# Patient Record
Sex: Female | Born: 1975 | Race: White | Hispanic: No | Marital: Single | Smoking: Never smoker
Health system: Southern US, Community
[De-identification: ages and names within clinical notes are randomized; demographics above are authoritative.]

## PROBLEM LIST (undated history)

## (undated) DIAGNOSIS — F909 Attention-deficit hyperactivity disorder, unspecified type: Secondary | ICD-10-CM

## (undated) DIAGNOSIS — R519 Headache, unspecified: Secondary | ICD-10-CM

## (undated) DIAGNOSIS — T7840XA Allergy, unspecified, initial encounter: Secondary | ICD-10-CM

## (undated) DIAGNOSIS — R011 Cardiac murmur, unspecified: Secondary | ICD-10-CM

## (undated) DIAGNOSIS — A749 Chlamydial infection, unspecified: Secondary | ICD-10-CM

## (undated) DIAGNOSIS — J45909 Unspecified asthma, uncomplicated: Secondary | ICD-10-CM

## (undated) DIAGNOSIS — K219 Gastro-esophageal reflux disease without esophagitis: Secondary | ICD-10-CM

## (undated) DIAGNOSIS — F419 Anxiety disorder, unspecified: Secondary | ICD-10-CM

## (undated) DIAGNOSIS — F32A Depression, unspecified: Secondary | ICD-10-CM

## (undated) DIAGNOSIS — R87619 Unspecified abnormal cytological findings in specimens from cervix uteri: Secondary | ICD-10-CM

## (undated) DIAGNOSIS — F329 Major depressive disorder, single episode, unspecified: Secondary | ICD-10-CM

## (undated) HISTORY — DX: Anxiety disorder, unspecified: F41.9

## (undated) HISTORY — PX: WISDOM TOOTH EXTRACTION: SHX21

## (undated) HISTORY — DX: Cardiac murmur, unspecified: R01.1

## (undated) HISTORY — PX: WRIST SURGERY: SHX841

## (undated) HISTORY — DX: Unspecified abnormal cytological findings in specimens from cervix uteri: R87.619

## (undated) HISTORY — DX: Depression, unspecified: F32.A

## (undated) HISTORY — DX: Allergy, unspecified, initial encounter: T78.40XA

## (undated) HISTORY — DX: Major depressive disorder, single episode, unspecified: F32.9

---

## 2006-08-10 ENCOUNTER — Ambulatory Visit: Payer: Self-pay | Admitting: Obstetrics & Gynecology

## 2006-08-31 ENCOUNTER — Ambulatory Visit: Payer: Self-pay | Admitting: Obstetrics & Gynecology

## 2007-02-27 ENCOUNTER — Ambulatory Visit: Payer: Self-pay | Admitting: Family Medicine

## 2007-07-24 ENCOUNTER — Ambulatory Visit: Payer: Self-pay | Admitting: Obstetrics & Gynecology

## 2007-09-17 ENCOUNTER — Ambulatory Visit: Payer: Self-pay | Admitting: Obstetrics & Gynecology

## 2007-09-17 ENCOUNTER — Encounter: Payer: Self-pay | Admitting: Obstetrics & Gynecology

## 2007-10-24 ENCOUNTER — Ambulatory Visit: Payer: Self-pay | Admitting: Obstetrics & Gynecology

## 2007-11-08 ENCOUNTER — Ambulatory Visit: Payer: Self-pay | Admitting: Obstetrics & Gynecology

## 2007-12-06 ENCOUNTER — Ambulatory Visit: Payer: Self-pay | Admitting: Nurse Practitioner

## 2007-12-10 ENCOUNTER — Ambulatory Visit: Payer: Self-pay | Admitting: Gynecology

## 2008-08-07 ENCOUNTER — Encounter: Payer: Self-pay | Admitting: Obstetrics & Gynecology

## 2008-08-07 ENCOUNTER — Ambulatory Visit: Payer: Self-pay | Admitting: Obstetrics & Gynecology

## 2009-05-06 ENCOUNTER — Ambulatory Visit: Payer: Self-pay | Admitting: Obstetrics & Gynecology

## 2009-05-07 ENCOUNTER — Encounter: Payer: Self-pay | Admitting: Obstetrics & Gynecology

## 2009-05-07 LAB — CONVERTED CEMR LAB
Clue Cells Wet Prep HPF POC: NONE SEEN
Trich, Wet Prep: NONE SEEN

## 2010-11-02 NOTE — Assessment & Plan Note (Signed)
Wendy, [name]                ACCOUNT NO.:  [id number]   MEDICAL RECORD NO.:  [id number]          PATIENT TYPE:  POB   LOCATION:  [facility]         FACILITY:  [facility]   PHYSICIAN:  Elsie Lincoln, MD      DATE OF BIRTH:  Oct 26, 1975   DATE OF SERVICE:  11/08/2007                                  CLINIC NOTE   The patient is a 35 year old female who presents for followup of  colposcopy.  The patient has a history of a low grade Pap smear and  underwent colposcopy with Dr. Marice Potter, which was satisfactory and one  biopsy was taken.  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  I discussed the pathogenesis of cervical  dysplasia with the patient and given that it is merely an LSIL Pap smear  with adequate colposcopy, the patient can be followed Pap smears q.6  months for a year.  If hose are within normal limits, she can return to  yearly screening.  However, if her Pap smear persists for a year for  LSIL, or progresses to a high-grade lesion, we will repeat colposcopy at  that point.  The patient verbalizes understanding.  We also talked about  Gardasil vaccination for her daughters when they reach age 57.  The  patient to come back in 6 months for repeat Pap smear only.           ______________________________  Elsie Lincoln, MD     KL/MEDQ  D:  11/08/2007  T:  11/08/2007  Job:  [id number]

## 2010-11-02 NOTE — Assessment & Plan Note (Signed)
NAMELERLINE, VALDIVIA                ACCOUNT NO.:  [id number]   MEDICAL RECORD NO.:  [id number]          PATIENT TYPE:  POB   LOCATION:  [facility]         FACILITY:  [facility]   PHYSICIAN:  Allie Bossier, MD        DATE OF BIRTH:  21-Oct-1975   DATE OF SERVICE:                                  CLINIC NOTE   Wendy [name] is a 35 year old divorced, white gravida 2, para 2 with 30- to 67-  year-old daughter.  She comes in here for annual exam and follow up for  history of abnormal Pap smears.  She has no particular GYN complaints.  Of note, she has lost over 40 pounds in the last year by training for a  half marathon and decreasing her carbohydrate intake.   PAST MEDICAL HISTORY:  She was recently diagnosed with ADD.  She has a  history of depression, acne, low-grade SIL Pap, and history of urinary  incontinence.   SURGICAL HISTORY:  She has a C-section, ganglion cyst removal, and  wisdom teeth extraction.   DRUG ALLERGIES:  CODEINE.  No LATEX allergies.   SOCIAL HISTORY:  She rarely drinks alcohol, denies tobacco, or drug use.   FAMILY HISTORY:  Negative for breast, GYN, and colon malignancies.  There is coronary heart disease in her family.  The remainder of review  of systems is negative.  She has been abstinent for the last year.   MEDICATIONS:  She has a Mirena IUD in placed.  She takes doxycycline  daily for acne, and Ritalin daily for her ADD.   PHYSICAL EXAMINATION:  VITAL SIGNS:  Weight 148, height 5 feet 3-3/4  inches, blood pressure 115/71, and pulse 63.  HEENT:  Normal.  BREASTS:  Normal bilaterally.  HEART:  Regular rate and rhythm.  ABDOMEN:  No hepatosplenomegaly.  C-section scar is visible.  EXTERNAL GENITALIA:  No lesions today.  No evidence of yeast (see  previous vulvar biopsies).  Cervix is parous, no lesions.  Uterus is  normal size, and shape, mobile.  Adnexa nontender.  No masses.   ASSESSMENT AND PLAN:  Annual exam.  I have checked a Pap smear.  I  recommended self-breast and self vulvar exams.  She will return in 6  months for a Pap smear since she did have a history of low-grade  dysplasia.      Allie Bossier, MD    MCD/MEDQ  D:  08/07/2008  T:  08/07/2008  Job:  [id number]

## 2010-11-02 NOTE — Assessment & Plan Note (Signed)
Wendy [name], BACHAND                ACCOUNT NO.:  [id number]   MEDICAL RECORD NO.:  [id number]          PATIENT TYPE:  POB   LOCATION:  [facility]         FACILITY:  [facility]   PHYSICIAN:  Allie Bossier, MD        DATE OF BIRTH:  01/13/1976   DATE OF SERVICE:                                  CLINIC NOTE   This is her annual exam.   Ms. Fogal only complaint today is a 5-year history of periodic  excoriation of her vulva.  She has this happening today.  She is still  having some irregular bleeding from her IUD but understands that they  most likely will eventually resolve.   PAST MEDICAL HISTORY:  1. She is overweight.  2. History of depression.  3. Acne.   PAST SURGICAL HISTORY:  1. A C-section.  2. A ganglion cyst.  3. Wisdom teeth extraction.   ALLERGIES:  Her only allergy to medicines is CODEINE.  No latex allergy.   SOCIAL HISTORY:  She drinks socially but denies tobacco or illegal drug  use.   FAMILY HISTORY:  Negative for breast, GYN and colon malignancies but is  positive for heart disease.   REVIEW OF SYSTEMS:  She denies a history of abnormal Pap smears.   PHYSICAL EXAMINATION:  VITAL SIGNS:  Weight 176 pounds, height 5 feet, 3  inches.  Blood pressure 109/71, pulse 80.  HEENT:  Normal.  HEART:  Regular rate and rhythm.  LUNGS:  Clear to auscultation bilaterally.  ABDOMEN:  Moderately obese, benign.  PELVIC:  External genitalia - an excoriation and sloughing of the skin  from mid level of her groin/labia majora down to her perianal area.  Vagina normal with normal discharge.  Cervix - IUD string seen; parous.  Uterus normal size and shape, anteverted, mobile, nontender.  Adnexa  nontender, nonenlarged.   ASSESSMENT/PLAN:  1. Annual exam.  Recommend self-breast and self vulvar exams.  2. Vulvar excoriations plus skin changes.  Will check HSV-2, IgG and      treat as necessary.  3. I have given her reassurance regarding her IUD-associated  bleeding.      Allie Bossier, MD     MCD/MEDQ  D:  09/17/2007  T:  09/17/2007  Job:  [id number]

## 2010-11-02 NOTE — Assessment & Plan Note (Signed)
Wendy [name], Wendy [name]                ACCOUNT NO.:  [id number]   MEDICAL RECORD NO.:  [id number]          PATIENT TYPE:  POB   LOCATION:  [facility]         FACILITY:  [facility]   PHYSICIAN:  Tinnie Gens, MD        DATE OF BIRTH:  [birth year]   DATE OF SERVICE:  02/27/2007                                  CLINIC NOTE   CHIEF COMPLAINT:  IUD removal.   HISTORY OF PRESENT ILLNESS:  The patient IS 35 year old gravida 2 para 2  who has had an IUD in since 2004.  She is no longer married and not  sexually active and desires it to be removed.  She is having heavy  discharge and heavy vaginal bleeding.   Her next concern is about incontinence.  She has had it since the  delivery of her babies.  She has talked to Dr. . Mia Creek about this  previously and is wondering if there is some kind of medicine she could  take for it or maybe get her bladder tacked.   PHYSICAL EXAMINATION:  VITAL SIGNS:  Her vitals are as noted the chart.  GENERAL:  She a well-developed, well-nourished female, in no acute  distress.  ABDOMEN:  Soft, nontender, nondistended.  GU:  Normal external female genitalia.  BUS is normal.  Vagina is pink  and rugated.  Cervix is parous, without lesions.  IUD string is easily  identified, grasped with ring forceps, and IUD removed without  difficulty.   IMPRESSION:  1. Intrauterine device removal.  2. Incontinence, questionable genuine stress incontinence.   PLAN:  The patient is unsure if she wants more children.  I do not think  bladder tacking at this point is reasonable. However, will allow her to  see Dr. Mia Creek to discuss this potential.           ______________________________  Tinnie Gens, MD     TP/MEDQ  D:  02/27/2007  T:  02/28/2007  Job:  [id number]

## 2010-11-02 NOTE — Assessment & Plan Note (Signed)
NAMESEELEY, SOUTHGATE                ACCOUNT NO.:  [id number]   MEDICAL RECORD NO.:  [id number]          PATIENT TYPE:  POB   LOCATION:  [facility]         FACILITY:  [facility]   PHYSICIAN:  Ginger Carne, MD DATE OF BIRTH:  November 25, 1975   DATE OF SERVICE:  12/10/2007                                  CLINIC NOTE   Ms. Baine is a 35 year old female who is here for vulvar biopsies.  She  has bilateral erythema of the vulva, groin, labia majora, and upper  parts of the fourchette.  It is peeling, tender, irritating, and also  pruritic.  She has had this off and on for the past 5 years.   PHYSICAL EXAMINATION:  Pelvic exam demonstrates no evidence of vaginal  lesions or discharge.  The vulva is tender, erythematous, and appears to  be thin with extension to the groin, fourchette, frenulum, and perianal  regions.  Punch biopsies were taken on the right and left sides (right  on the vulva, left on the labia majora).   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  The other possibility could be eczema as well.           ______________________________  Ginger Carne, MD     SHB/MEDQ  D:  12/10/2007  T:  12/11/2007  Job:  [id number]

## 2010-11-05 NOTE — Assessment & Plan Note (Signed)
Wendy [name], Wendy [name]                ACCOUNT NO.:  [id number]   MEDICAL RECORD NO.:  [id number]          PATIENT TYPE:  POB   LOCATION:  [facility]         FACILITY:  [facility]   PHYSICIAN:  Elsie Lincoln, MD      DATE OF BIRTH:  11-Jul-1975   DATE OF SERVICE:                                  CLINIC NOTE   The patient is a 35 year old, G2, P2 female, LMP unknown, but has been  having some bleeding.  She had an IUD placed in 2004 by Dr. Mia Creek,  who historically cuts his strings extremely short.  She is amenorrheic  for several years up until just recently and now has been having some  weird clotting.  She had a Pap smear done by a nurse practitioner, who  did not comment on the IUD strings, so she came to [place] to see what was  going on.  The patient just wants to make sure that the IUD is in the  correct place and working.   PAST MEDICAL HISTORY:  Heart murmur.   PAST SURGICAL HISTORY:  C section times 1 and removal of a ganglion cyst  in 1999.  Her GYN history, she has never had an abnormal Pap smear and  never had a mammogram.   FAMILY HISTORY:  Both grandparents have heart disease.  Both  grandparents have had heart attacks and her grandmother has high blood  pressure.   SOCIAL HISTORY:  She does not smoke.  She has occasional alcoholic  beverages and drinks several caffeinated beverages a week.  She is not a  victim of sexual abuse.  She lives with her 2 daughters.   PHYSICAL EXAMINATION:  VITAL SIGNS:  Pulse 79, blood pressure 114/77.  GENERAL:  Well nourished, well developed, in no apparent distress.  GENITALIA:  __________ .  Vagina pink with normal rugae.  IUD strings  appear at least 3 cm.  Bimanual exam cannot feel any part of the IUD.  Uterus is nontender.  Adnexa:  No masses, nontender.   PLAN:  This is a 35 year old female with abnormal bleeding secondary to  IUD.  1. UCD today.  2. Ultrasound to see IUD placement given that Dr. Mia Creek normally      cuts  his strings very short and they are at least 3 cm now.  It is      worrisome that the IUD is dislodged and that is why she is having      the increased bleeding.  3. Return to clinic in 2 weeks for results.           ______________________________  Elsie Lincoln, MD     KL/MEDQ  D:  08/10/2006  T:  08/10/2006  Job:  [id number]

## 2010-11-05 NOTE — Assessment & Plan Note (Signed)
[name], [name]                ACCOUNT NO.:  [id number]   MEDICAL RECORD NO.:  [id number]          PATIENT TYPE:  POB   LOCATION:  [facility]         FACILITY:  [facility]   PHYSICIAN:  Carolanne Grumbling, M.D.   DATE OF BIRTH:  [birth year]   DATE OF SERVICE:  08/31/2006                                  CLINIC NOTE   A 35 year old gravida 2, para 2 here for evaluation of intrauterine  device.  Please see dictation from August 10, 2006 for full details.  Briefly, the patient had a intrauterine device placed in 2004 and  started having vaginal bleeding and she was concerned it was displaced.  Pelvic ultrasound done on August 16, 2006 showed it is still in  position.  The patient has had minimal spotting since she was last seen.  Other issue today is depression.  The patient has been on Effexor 75 mg  daily for over one year.  She reports good response to it initially.  Endorses increase somnolence, anhedonia, and a 15 pound weight gain.  Describes herself as feeling disjoined.  All this has been aggravated  by her going through a divorce.  She would like to increase her  medications.   PHYSICAL EXAMINATION:  VITAL SIGNS:  Per nursing.  GENERAL:  A well-developed, well-nourished female who is very cheerful  during the exam.  Her mother accompanies her today.   ASSESSMENT/PLAN:  1. Depression.  We will increase her Effexor to 150 mg daily.  Stress      the importance of not stopping this medicine suddenly.  She is also      to start seeing a counselor on September 05, 2006.  She denies any      suicidal ideation today.  2. Abnormal uterine bleeding, resolved.  Re-assured the patient that      the intrauterine device was in the appropriate place.  She is to      follow up.           ______________________________  Carolanne Grumbling, M.D.     TW/MEDQ  D:  08/31/2006  T:  09/01/2006  Job:  [id number]

## 2011-04-02 ENCOUNTER — Emergency Department: Payer: Self-pay | Admitting: *Deleted

## 2011-10-19 DIAGNOSIS — R87619 Unspecified abnormal cytological findings in specimens from cervix uteri: Secondary | ICD-10-CM

## 2011-10-19 HISTORY — DX: Unspecified abnormal cytological findings in specimens from cervix uteri: R87.619

## 2012-02-13 ENCOUNTER — Encounter: Payer: Self-pay | Admitting: Obstetrics and Gynecology

## 2012-02-13 ENCOUNTER — Ambulatory Visit (INDEPENDENT_AMBULATORY_CARE_PROVIDER_SITE_OTHER): Payer: PRIVATE HEALTH INSURANCE | Admitting: Obstetrics and Gynecology

## 2012-02-13 VITALS — BP 128/83 | HR 95 | Ht 63.0 in | Wt 179.0 lb

## 2012-02-13 DIAGNOSIS — F329 Major depressive disorder, single episode, unspecified: Secondary | ICD-10-CM

## 2012-02-13 DIAGNOSIS — N760 Acute vaginitis: Secondary | ICD-10-CM

## 2012-02-13 DIAGNOSIS — F32A Depression, unspecified: Secondary | ICD-10-CM | POA: Insufficient documentation

## 2012-02-13 LAB — POCT URINALYSIS DIPSTICK
Blood, UA: NEGATIVE
Ketones, UA: NEGATIVE
Protein, UA: NEGATIVE
Spec Grav, UA: 1.025
Urobilinogen, UA: 0.2

## 2012-02-13 NOTE — Progress Notes (Signed)
Patient ID: Wendy [name], female   DOB: 1976-04-15, 36 y.o.   MRN: [id number] 36 yo G2P2 with BMI 31 and using Mirena IUD for birth control presents today for several issues. Patient reports that for the past several years she has noticed a progressive worsening of urinary incontinence. Patient states that she is not aware that she is urinating on herself and often smells of urine by the end of the day. Patient denies incontinence with coughing/sneezing/laughing. Patient is not aware that she is urinating on herself but does report that it occurs several times a day. Patient is certain that it is not a vaginal discharge but rather urine by its smell.  Patient reports onset of depression and anxiety since having the IUD inserted in 2004. Patient states that she is being managed for depression with Wellbutrin. After doing some research she has found that the Mirena IUD can cause depression in some women. She desires to have the IUD removed and is planning on using condoms for birth control. Patient desires to be evaluated for a vaginal odor. She denies any abnormal discharge but reports the onset of a vaginal odor a week ago and is wondering if i tis BV. She is sexually active with a new Partner since April 2013 and declined STD testing.   Abd: S/NT/ND Pelvic exam: normal external genitalia. Normal vaginal mucosa and cervix. No abnormal discharge. IUD strings visualized at os. Small uterus, no palpable adnexal mass or tenderness. Good pelvic tone. No degree of bladder prolapse appreaciated.  A/P 36 yo G2P2 here with multiple complaints. 1) Patient will be referred to Dr. Marice Potter for the evaluation of ? Urinary incontinence 2) IUD was removed per patient request without any difficulty after grasping IUD stings with a ring forceps. Patient is planning on using condoms for birth control 3) Wet prep and urine culture were collected. Patient will be contacted with any abnormal results. 4) patient with h/o abnormal  pap smear and had pap smear performed in May in a different office. Will obtain these records

## 2012-02-13 NOTE — Addendum Note (Signed)
Addended by: Vinnie Langton C on: 02/13/2012 02:50 PM   Modules accepted: Orders

## 2012-02-13 NOTE — Progress Notes (Deleted)
  Subjective:     Wendy [name] is a 36 y.o. female G2P2 with BMI 31 is here for a comprehensive physical exam. The patient reports {problems:16946}.  History   Social History  . Marital Status: Divorced    Spouse Name: N/A    Number of Children: N/A  . Years of Education: N/A   Occupational History  . Not on file.   Social History Main Topics  . Smoking status: Never Smoker   . Smokeless tobacco: Not on file  . Alcohol Use: Yes     occasion  . Drug Use: No  . Sexually Active: Yes -- Female partner(s)    Birth Control/ Protection: IUD   Other Topics Concern  . Not on file   Social History Narrative  . No narrative on file   Health Maintenance  Topic Date Due  . Pap Smear  02/15/1994  . Tetanus/tdap  02/16/1995  . Influenza Vaccine  03/20/2012       Review of Systems A comprehensive review of systems was negative.   Objective:     GENERAL: Well-developed, well-nourished female in no acute distress.  HEENT: Normocephalic, atraumatic. Sclerae anicteric.  NECK: Supple. Normal thyroid.  LUNGS: Clear to auscultation bilaterally.  HEART: Regular rate and rhythm. BREASTS: Symmetric in size. No palpable masses or lymphadenopathy, skin changes, or nipple drainage. ABDOMEN: Soft, nontender, nondistended. No organomegaly. PELVIC: Normal external female genitalia. Vagina is pink and rugated.  Normal discharge. Normal appearing cervix. Uterus is normal in size. No adnexal mass or tenderness. EXTREMITIES: No cyanosis, clubbing, or edema, 2+ distal pulses.    Assessment:    Healthy female exam.      Plan:    pap smear collected Wet prep collected Patient encouraged to perform self breast and vulva exams See After Visit Summary for Counseling Recommendations

## 2012-02-13 NOTE — Addendum Note (Signed)
Addended by: Vinnie Langton C on: 02/13/2012 02:54 PM   Modules accepted: Orders

## 2012-02-13 NOTE — Patient Instructions (Signed)
Preventive Care for Adults, Female A healthy lifestyle and preventive care can promote health and wellness. Preventive health guidelines for women include the following key practices.  A routine yearly physical is a good way to check with your caregiver about your health and preventive screening. It is a chance to share any concerns and updates on your health, and to receive a thorough exam.   Visit your dentist for a routine exam and preventive care every 6 months. Brush your teeth twice a day and floss once a day. Good oral hygiene prevents tooth decay and gum disease.   The frequency of eye exams is based on your age, health, family medical history, use of contact lenses, and other factors. Follow your caregiver's recommendations for frequency of eye exams.   Eat a healthy diet. Foods like vegetables, fruits, whole grains, low-fat dairy products, and lean protein foods contain the nutrients you need without too many calories. Decrease your intake of foods high in solid fats, added sugars, and salt. Eat the right amount of calories for you.Get information about a proper diet from your caregiver, if necessary.   Regular physical exercise is one of the most important things you can do for your health. Most adults should get at least 150 minutes of moderate-intensity exercise (any activity that increases your heart rate and causes you to sweat) each week. In addition, most adults need muscle-strengthening exercises on 2 or more days a week.   Maintain a healthy weight. The body mass index (BMI) is a screening tool to identify possible weight problems. It provides an estimate of body fat based on height and weight. Your caregiver can help determine your BMI, and can help you achieve or maintain a healthy weight.For adults 20 years and older:   A BMI below 18.5 is considered underweight.   A BMI of 18.5 to 24.9 is normal.   A BMI of 25 to 29.9 is considered overweight.   A BMI of 30 and above is  considered obese.   Maintain normal blood lipids and cholesterol levels by exercising and minimizing your intake of saturated fat. Eat a balanced diet with plenty of fruit and vegetables. Blood tests for lipids and cholesterol should begin at age 20 and be repeated every 5 years. If your lipid or cholesterol levels are high, you are over 50, or you are at high risk for heart disease, you may need your cholesterol levels checked more frequently.Ongoing high lipid and cholesterol levels should be treated with medicines if diet and exercise are not effective.   If you smoke, find out from your caregiver how to quit. If you do not use tobacco, do not start.   If you are pregnant, do not drink alcohol. If you are breastfeeding, be very cautious about drinking alcohol. If you are not pregnant and choose to drink alcohol, do not exceed 1 drink per day. One drink is considered to be 12 ounces (355 mL) of beer, 5 ounces (148 mL) of wine, or 1.5 ounces (44 mL) of liquor.   Avoid use of street drugs. Do not share needles with anyone. Ask for help if you need support or instructions about stopping the use of drugs.   High blood pressure causes heart disease and increases the risk of stroke. Your blood pressure should be checked at least every 1 to 2 years. Ongoing high blood pressure should be treated with medicines if weight loss and exercise are not effective.   If you are 55 to 36   years old, ask your caregiver if you should take aspirin to prevent strokes.   Diabetes screening involves taking a blood sample to check your fasting blood sugar level. This should be done once every 3 years, after age 45, if you are within normal weight and without risk factors for diabetes. Testing should be considered at a younger age or be carried out more frequently if you are overweight and have at least 1 risk factor for diabetes.   Breast cancer screening is essential preventive care for women. You should practice "breast  self-awareness." This means understanding the normal appearance and feel of your breasts and may include breast self-examination. Any changes detected, no matter how small, should be reported to a caregiver. Women in their 20s and 30s should have a clinical breast exam (CBE) by a caregiver as part of a regular health exam every 1 to 3 years. After age 40, women should have a CBE every year. Starting at age 40, women should consider having a mammography (breast X-ray test) every year. Women who have a family history of breast cancer should talk to their caregiver about genetic screening. Women at a high risk of breast cancer should talk to their caregivers about having magnetic resonance imaging (MRI) and a mammography every year.   The Pap test is a screening test for cervical cancer. A Pap test can show cell changes on the cervix that might become cervical cancer if left untreated. A Pap test is a procedure in which cells are obtained and examined from the lower end of the uterus (cervix).   Women should have a Pap test starting at age 21.   Between ages 21 and 29, Pap tests should be repeated every 2 years.   Beginning at age 30, you should have a Pap test every 3 years as long as the past 3 Pap tests have been normal.   Some women have medical problems that increase the chance of getting cervical cancer. Talk to your caregiver about these problems. It is especially important to talk to your caregiver if a new problem develops soon after your last Pap test. In these cases, your caregiver may recommend more frequent screening and Pap tests.   The above recommendations are the same for women who have or have not gotten the vaccine for human papillomavirus (HPV).   If you had a hysterectomy for a problem that was not cancer or a condition that could lead to cancer, then you no longer need Pap tests. Even if you no longer need a Pap test, a regular exam is a good idea to make sure no other problems are  starting.   If you are between ages 65 and 70, and you have had normal Pap tests going back 10 years, you no longer need Pap tests. Even if you no longer need a Pap test, a regular exam is a good idea to make sure no other problems are starting.   If you have had past treatment for cervical cancer or a condition that could lead to cancer, you need Pap tests and screening for cancer for at least 20 years after your treatment.   If Pap tests have been discontinued, risk factors (such as a new sexual partner) need to be reassessed to determine if screening should be resumed.   The HPV test is an additional test that may be used for cervical cancer screening. The HPV test looks for the virus that can cause the cell changes on the cervix.   The cells collected during the Pap test can be tested for HPV. The HPV test could be used to screen women aged 30 years and older, and should be used in women of any age who have unclear Pap test results. After the age of 30, women should have HPV testing at the same frequency as a Pap test.   Colorectal cancer can be detected and often prevented. Most routine colorectal cancer screening begins at the age of 50 and continues through age 75. However, your caregiver may recommend screening at an earlier age if you have risk factors for colon cancer. On a yearly basis, your caregiver may provide home test kits to check for hidden blood in the stool. Use of a small camera at the end of a tube, to directly examine the colon (sigmoidoscopy or colonoscopy), can detect the earliest forms of colorectal cancer. Talk to your caregiver about this at age 50, when routine screening begins. Direct examination of the colon should be repeated every 5 to 10 years through age 75, unless early forms of pre-cancerous polyps or small growths are found.   Hepatitis C blood testing is recommended for all people born from 1945 through 1965 and any individual with known risks for hepatitis C.    Practice safe sex. Use condoms and avoid high-risk sexual practices to reduce the spread of sexually transmitted infections (STIs). STIs include gonorrhea, chlamydia, syphilis, trichomonas, herpes, HPV, and human immunodeficiency virus (HIV). Herpes, HIV, and HPV are viral illnesses that have no cure. They can result in disability, cancer, and death. Sexually active women aged 25 and younger should be checked for chlamydia. Older women with new or multiple partners should also be tested for chlamydia. Testing for other STIs is recommended if you are sexually active and at increased risk.   Osteoporosis is a disease in which the bones lose minerals and strength with aging. This can result in serious bone fractures. The risk of osteoporosis can be identified using a bone density scan. Women ages 65 and over and women at risk for fractures or osteoporosis should discuss screening with their caregivers. Ask your caregiver whether you should take a calcium supplement or vitamin D to reduce the rate of osteoporosis.   Menopause can be associated with physical symptoms and risks. Hormone replacement therapy is available to decrease symptoms and risks. You should talk to your caregiver about whether hormone replacement therapy is right for you.   Use sunscreen with sun protection factor (SPF) of 30 or more. Apply sunscreen liberally and repeatedly throughout the day. You should seek shade when your shadow is shorter than you. Protect yourself by wearing long sleeves, pants, a wide-brimmed hat, and sunglasses year round, whenever you are outdoors.   Once a month, do a whole body skin exam, using a mirror to look at the skin on your back. Notify your caregiver of new moles, moles that have irregular borders, moles that are larger than a pencil eraser, or moles that have changed in shape or color.   Stay current with required immunizations.   Influenza. You need a dose every fall (or winter). The composition of  the flu vaccine changes each year, so being vaccinated once is not enough.   Pneumococcal polysaccharide. You need 1 to 2 doses if you smoke cigarettes or if you have certain chronic medical conditions. You need 1 dose at age 65 (or older) if you have never been vaccinated.   Tetanus, diphtheria, pertussis (Tdap, Td). Get 1 dose of   Tdap vaccine if you are younger than age 65, are over 65 and have contact with an infant, are a healthcare worker, are pregnant, or simply want to be protected from whooping cough. After that, you need a Td booster dose every 10 years. Consult your caregiver if you have not had at least 3 tetanus and diphtheria-containing shots sometime in your life or have a deep or dirty wound.   HPV. You need this vaccine if you are a woman age 26 or younger. The vaccine is given in 3 doses over 6 months.   Measles, mumps, rubella (MMR). You need at least 1 dose of MMR if you were born in 1957 or later. You may also need a second dose.   Meningococcal. If you are age 19 to 21 and a first-year college student living in a residence hall, or have one of several medical conditions, you need to get vaccinated against meningococcal disease. You may also need additional booster doses.   Zoster (shingles). If you are age 60 or older, you should get this vaccine.   Varicella (chickenpox). If you have never had chickenpox or you were vaccinated but received only 1 dose, talk to your caregiver to find out if you need this vaccine.   Hepatitis A. You need this vaccine if you have a specific risk factor for hepatitis A virus infection or you simply wish to be protected from this disease. The vaccine is usually given as 2 doses, 6 to 18 months apart.   Hepatitis B. You need this vaccine if you have a specific risk factor for hepatitis B virus infection or you simply wish to be protected from this disease. The vaccine is given in 3 doses, usually over 6 months.  Preventive Services /  Frequency Ages 19 to 39  Blood pressure check.** / Every 1 to 2 years.   Lipid and cholesterol check.** / Every 5 years beginning at age 20.   Clinical breast exam.** / Every 3 years for women in their 20s and 30s.   Pap test.** / Every 2 years from ages 21 through 29. Every 3 years starting at age 30 through age 65 or 70 with a history of 3 consecutive normal Pap tests.   HPV screening.** / Every 3 years from ages 30 through ages 65 to 70 with a history of 3 consecutive normal Pap tests.   Hepatitis C blood test.** / For any individual with known risks for hepatitis C.   Skin self-exam. / Monthly.   Influenza immunization.** / Every year.   Pneumococcal polysaccharide immunization.** / 1 to 2 doses if you smoke cigarettes or if you have certain chronic medical conditions.   Tetanus, diphtheria, pertussis (Tdap, Td) immunization. / A one-time dose of Tdap vaccine. After that, you need a Td booster dose every 10 years.   HPV immunization. / 3 doses over 6 months, if you are 26 and younger.   Measles, mumps, rubella (MMR) immunization. / You need at least 1 dose of MMR if you were born in 1957 or later. You may also need a second dose.   Meningococcal immunization. / 1 dose if you are age 19 to 21 and a first-year college student living in a residence hall, or have one of several medical conditions, you need to get vaccinated against meningococcal disease. You may also need additional booster doses.   Varicella immunization.** / Consult your caregiver.   Hepatitis A immunization.** / Consult your caregiver. 2 doses, 6 to 18 months   apart.   Hepatitis B immunization.** / Consult your caregiver. 3 doses usually over 6 months.    ** Family history and personal history of risk and conditions may change your caregiver's recommendations. Document Released: 08/02/2001 Document Revised: 05/26/2011 Document Reviewed: 11/01/2010 [employer] Patient Information 2012 [employer]. 

## 2012-02-14 LAB — WET PREP, GENITAL
Clue Cells Wet Prep HPF POC: NONE SEEN
Trich, Wet Prep: NONE SEEN
Yeast Wet Prep HPF POC: NONE SEEN

## 2012-02-27 ENCOUNTER — Encounter: Payer: Self-pay | Admitting: Obstetrics & Gynecology

## 2012-02-27 ENCOUNTER — Ambulatory Visit (INDEPENDENT_AMBULATORY_CARE_PROVIDER_SITE_OTHER): Payer: PRIVATE HEALTH INSURANCE | Admitting: Obstetrics & Gynecology

## 2012-02-27 VITALS — BP 102/70 | HR 92 | Ht 63.0 in | Wt 178.0 lb

## 2012-02-27 DIAGNOSIS — R32 Unspecified urinary incontinence: Secondary | ICD-10-CM

## 2012-02-27 DIAGNOSIS — IMO0001 Reserved for inherently not codable concepts without codable children: Secondary | ICD-10-CM

## 2012-02-27 DIAGNOSIS — Z3009 Encounter for other general counseling and advice on contraception: Secondary | ICD-10-CM

## 2012-02-27 NOTE — Progress Notes (Signed)
  Subjective:    Patient ID: Wendy [name], female    DOB: 1975/07/20, 36 y.o.   MRN: [id number]  HPI  Wendy [name] is a 36 yo SWP2 who is here to discuss birth control and her urinary incontinence. She has had sex once since her Mirena was removed. She used a condom then but she would like permanent sterility in the form of a tubal ligation. She is monogamous since April with a fellow who does not want more kids. She does not want more kids. She understands the risks of surgery as well as the failure rate (1/200). She declines alternative forms of birth control.  Her second issue is that of feeling like there is urine in her panties every day. She doesn't wear pads but changes her panties. It is not GSUI. She has a h/o enuresis until age 85 and she had urodynamic studies as a teenager.  Review of Systems     Objective:   Physical Exam        Assessment & Plan:  1) Birth control. She would like to have permanent sterility with a BTL. I will ask Wendy [name] to schedule it. 2) Incontinence issues. I think that she would benefit from a urology consult.

## 2012-03-12 ENCOUNTER — Telehealth: Payer: Self-pay

## 2012-03-12 NOTE — Telephone Encounter (Signed)
Patient called she is having surgery in October for a tubal. Her PCP has moved and now has a new appointment with her new PCP till November. Her problem is that she is going to be out of her Wellbutrin in a couple of days. Wanted to know if we could just fill her meds for two months till she is in with her new PCP. I asked Dr. Jolayne Panther and she approved her getting 2 refills and called it in to her [facility].

## 2012-04-18 ENCOUNTER — Encounter (HOSPITAL_COMMUNITY): Payer: Self-pay | Admitting: Pharmacist

## 2012-04-20 HISTORY — PX: TUBAL LIGATION: SHX77

## 2012-04-27 ENCOUNTER — Encounter (HOSPITAL_COMMUNITY): Payer: Self-pay | Admitting: *Deleted

## 2012-05-02 ENCOUNTER — Encounter (HOSPITAL_COMMUNITY): Payer: Self-pay | Admitting: Anesthesiology

## 2012-05-02 ENCOUNTER — Ambulatory Visit (HOSPITAL_COMMUNITY)
Admission: RE | Admit: 2012-05-02 | Discharge: 2012-05-02 | Disposition: A | Discharge status: Home / Self Care | Payer: PRIVATE HEALTH INSURANCE | Source: Ambulatory Visit | Attending: Obstetrics & Gynecology | Admitting: Obstetrics & Gynecology

## 2012-05-02 ENCOUNTER — Ambulatory Visit (HOSPITAL_COMMUNITY): Payer: PRIVATE HEALTH INSURANCE | Admitting: Anesthesiology

## 2012-05-02 ENCOUNTER — Encounter (HOSPITAL_COMMUNITY)
Admission: RE | Disposition: A | Discharge status: Home / Self Care | Payer: Self-pay | Source: Ambulatory Visit | Attending: Obstetrics & Gynecology

## 2012-05-02 DIAGNOSIS — Z302 Encounter for sterilization: Secondary | ICD-10-CM

## 2012-05-02 DIAGNOSIS — Z309 Encounter for contraceptive management, unspecified: Secondary | ICD-10-CM

## 2012-05-02 HISTORY — PX: LAPAROSCOPIC TUBAL LIGATION: SHX1937

## 2012-05-02 LAB — PREGNANCY, URINE: Preg Test, Ur: NEGATIVE

## 2012-05-02 LAB — CBC
HCT: 39.5 % (ref 36.0–46.0)
MCH: 31.2 pg (ref 26.0–34.0)
MCHC: 34.7 g/dL (ref 30.0–36.0)
RDW: 13.1 % (ref 11.5–15.5)

## 2012-05-02 LAB — SURGICAL PCR SCREEN: Staphylococcus aureus: NEGATIVE

## 2012-05-02 SURGERY — LIGATION, FALLOPIAN TUBE, LAPAROSCOPIC
Anesthesia: General | Site: Abdomen | Laterality: Bilateral | Wound class: Clean

## 2012-05-02 MED ORDER — MIDAZOLAM HCL 2 MG/2ML IJ SOLN
INTRAMUSCULAR | Status: AC
  Filled 2012-05-02: qty 2

## 2012-05-02 MED ORDER — LACTATED RINGERS IV SOLN
INTRAVENOUS | Status: DC
  Administered 2012-05-02: 11:00:00 via INTRAVENOUS

## 2012-05-02 MED ORDER — BUPIVACAINE HCL (PF) 0.5 % IJ SOLN
INTRAMUSCULAR | Status: AC
  Filled 2012-05-02: qty 30

## 2012-05-02 MED ORDER — KETOROLAC TROMETHAMINE 30 MG/ML IJ SOLN: 15.0000 mg | Freq: Once | INTRAMUSCULAR | Status: DC | PRN

## 2012-05-02 MED ORDER — FENTANYL CITRATE 0.05 MG/ML IJ SOLN
INTRAMUSCULAR | Status: DC | PRN
  Administered 2012-05-02: 50 ug via INTRAVENOUS
  Administered 2012-05-02 (×2): 100 ug via INTRAVENOUS

## 2012-05-02 MED ORDER — ROCURONIUM BROMIDE 100 MG/10ML IV SOLN
INTRAVENOUS | Status: DC | PRN
  Administered 2012-05-02: 5 mg via INTRAVENOUS
  Administered 2012-05-02: 30 mg via INTRAVENOUS

## 2012-05-02 MED ORDER — OXYCODONE-ACETAMINOPHEN 5-325 MG PO TABS: 1.0000 | ORAL_TABLET | ORAL | Status: DC | PRN

## 2012-05-02 MED ORDER — NEOSTIGMINE METHYLSULFATE 1 MG/ML IJ SOLN
INTRAMUSCULAR | Status: DC | PRN
  Administered 2012-05-02: 3 mg via INTRAVENOUS

## 2012-05-02 MED ORDER — MUPIROCIN 2 % EX OINT
TOPICAL_OINTMENT | CUTANEOUS | Status: AC
  Filled 2012-05-02: qty 22

## 2012-05-02 MED ORDER — GLYCOPYRROLATE 0.2 MG/ML IJ SOLN
INTRAMUSCULAR | Status: DC | PRN
  Administered 2012-05-02: 0.4 mg via INTRAVENOUS

## 2012-05-02 MED ORDER — LIDOCAINE HCL (CARDIAC) 20 MG/ML IV SOLN
INTRAVENOUS | Status: AC
  Filled 2012-05-02: qty 5

## 2012-05-02 MED ORDER — PROPOFOL 10 MG/ML IV EMUL
INTRAVENOUS | Status: AC
  Filled 2012-05-02: qty 20

## 2012-05-02 MED ORDER — IBUPROFEN 800 MG PO TABS: 800.0000 mg | ORAL_TABLET | Freq: Three times a day (TID) | ORAL | Status: DC | PRN

## 2012-05-02 MED ORDER — DEXAMETHASONE SODIUM PHOSPHATE 10 MG/ML IJ SOLN
INTRAMUSCULAR | Status: AC
  Filled 2012-05-02: qty 1

## 2012-05-02 MED ORDER — LIDOCAINE HCL (CARDIAC) 20 MG/ML IV SOLN
INTRAVENOUS | Status: DC | PRN
  Administered 2012-05-02: 30 mg via INTRAVENOUS

## 2012-05-02 MED ORDER — ONDANSETRON HCL 4 MG/2ML IJ SOLN: 4.0000 mg | Freq: Once | INTRAMUSCULAR | Status: DC | PRN

## 2012-05-02 MED ORDER — ROCURONIUM BROMIDE 50 MG/5ML IV SOLN
INTRAVENOUS | Status: AC
  Filled 2012-05-02: qty 1

## 2012-05-02 MED ORDER — KETOROLAC TROMETHAMINE 30 MG/ML IJ SOLN
INTRAMUSCULAR | Status: AC
  Filled 2012-05-02: qty 1

## 2012-05-02 MED ORDER — CEFAZOLIN SODIUM-DEXTROSE 2-3 GM-% IV SOLR: 2.0000 g | INTRAVENOUS | Status: DC

## 2012-05-02 MED ORDER — BUPIVACAINE HCL (PF) 0.5 % IJ SOLN
INTRAMUSCULAR | Status: DC | PRN
  Administered 2012-05-02: 10 mL

## 2012-05-02 MED ORDER — FENTANYL CITRATE 0.05 MG/ML IJ SOLN
INTRAMUSCULAR | Status: AC
  Filled 2012-05-02: qty 5

## 2012-05-02 MED ORDER — DEXAMETHASONE SODIUM PHOSPHATE 4 MG/ML IJ SOLN
INTRAMUSCULAR | Status: DC | PRN
  Administered 2012-05-02: 8 mg via INTRAVENOUS

## 2012-05-02 MED ORDER — ONDANSETRON HCL 4 MG/2ML IJ SOLN
INTRAMUSCULAR | Status: AC
  Filled 2012-05-02: qty 2

## 2012-05-02 MED ORDER — MEPERIDINE HCL 25 MG/ML IJ SOLN: 6.2500 mg | INTRAMUSCULAR | Status: DC | PRN

## 2012-05-02 MED ORDER — KETOROLAC TROMETHAMINE 30 MG/ML IJ SOLN
INTRAMUSCULAR | Status: DC | PRN
  Administered 2012-05-02: 30 mg via INTRAVENOUS

## 2012-05-02 MED ORDER — MIDAZOLAM HCL 5 MG/5ML IJ SOLN
INTRAMUSCULAR | Status: DC | PRN
  Administered 2012-05-02: 2 mg via INTRAVENOUS

## 2012-05-02 MED ORDER — PROPOFOL 10 MG/ML IV EMUL
INTRAVENOUS | Status: DC | PRN
  Administered 2012-05-02: 10 mg via INTRAVENOUS
  Administered 2012-05-02: 190 mg via INTRAVENOUS

## 2012-05-02 MED ORDER — FENTANYL CITRATE 0.05 MG/ML IJ SOLN: 25.0000 ug | INTRAMUSCULAR | Status: DC | PRN

## 2012-05-02 MED ORDER — ONDANSETRON HCL 4 MG/2ML IJ SOLN
INTRAMUSCULAR | Status: DC | PRN
  Administered 2012-05-02: 4 mg via INTRAVENOUS

## 2012-05-02 SURGICAL SUPPLY — 20 items
CABLE HIGH FREQUENCY MONO STRZ (ELECTRODE) IMPLANT
CATH ROBINSON RED A/P 16FR (CATHETERS) ×2 IMPLANT
CHLORAPREP W/TINT 26ML (MISCELLANEOUS) ×2 IMPLANT
CLIP FILSHIE TUBAL LIGA STRL (Clip) ×2 IMPLANT
CLOTH BEACON ORANGE TIMEOUT ST (SAFETY) ×2 IMPLANT
DRSG COVADERM PLUS 2X2 (GAUZE/BANDAGES/DRESSINGS) ×1 IMPLANT
ELECT REM PT RETURN 9FT ADLT (ELECTROSURGICAL)
ELECTRODE REM PT RTRN 9FT ADLT (ELECTROSURGICAL) IMPLANT
GLOVE BIO SURGEON STRL SZ 6.5 (GLOVE) ×4 IMPLANT
GOWN PREVENTION PLUS LG XLONG (DISPOSABLE) ×4 IMPLANT
NDL INSUFFLATION 14GA 120MM (NEEDLE) ×1 IMPLANT
NDL SAFETY ECLIPSE 18X1.5 (NEEDLE) ×1 IMPLANT
NEEDLE HYPO 18GX1.5 SHARP (NEEDLE) ×2
NEEDLE INSUFFLATION 14GA 120MM (NEEDLE) ×2 IMPLANT
PACK LAPAROSCOPY BASIN (CUSTOM PROCEDURE TRAY) ×2 IMPLANT
SUT VICRYL 0 UR6 27IN ABS (SUTURE) ×2 IMPLANT
SUT VICRYL 4-0 PS2 18IN ABS (SUTURE) ×2 IMPLANT
TOWEL OR 17X24 6PK STRL BLUE (TOWEL DISPOSABLE) ×4 IMPLANT
TROCAR XCEL NON-BLD 11X100MML (ENDOMECHANICALS) ×2 IMPLANT
WATER STERILE IRR 1000ML POUR (IV SOLUTION) ×2 IMPLANT

## 2012-05-02 NOTE — H&P (Signed)
  36 yo DW P2 (9 and 36yo) here today for a tubal ligation. She understands the failure rate of 1/200. She declines alternative forms of birth control. She has used condoms, OCPs, and Mirena  PMH- depression  PSH- wisdom teeth, C/S, ganglion cyst removal  NKDA  SH- social ETOH  ROS-recently ended her sexual relationship, works out a lot, works at a retirement center  [employer] breast, colon, gyn  PE- [id number]  Heart- rrr Lungs- CTAB ABD- benign  A/P. Desire for permanent sterility. I will do a laparoscopic application of Filsche clips. She understands the failure rate of the surgery and risks of surgery.

## 2012-05-02 NOTE — Transfer of Care (Signed)
Immediate Anesthesia Transfer of Care Note  Patient: Wendy [name]  Procedure(s) Performed: Procedure(s) (LRB) with comments: LAPAROSCOPIC TUBAL LIGATION (Bilateral) - with filshie clips  Patient Location: PACU  Anesthesia Type:General  Level of Consciousness: awake, alert  and oriented  Airway & Oxygen Therapy: Patient Spontanous Breathing and Patient connected to nasal cannula oxygen  Post-op Assessment: Report given to PACU RN and Post -op Vital signs reviewed and stable  Post vital signs: stable  Complications: No apparent anesthesia complications

## 2012-05-02 NOTE — Anesthesia Preprocedure Evaluation (Signed)
Anesthesia Evaluation  Patient identified by MRN, date of birth, ID band Patient awake    Reviewed: Allergy & Precautions, H&P , NPO status , Patient's Chart, lab work & pertinent test results  Airway Mallampati: I TM Distance: >3 FB Neck ROM: full    Dental No notable dental hx. (+) Teeth Intact   Pulmonary neg pulmonary ROS,    Pulmonary exam normal       Cardiovascular negative cardio ROS      Neuro/Psych PSYCHIATRIC DISORDERS Anxiety Depression negative neurological ROS     GI/Hepatic negative GI ROS, Neg liver ROS,   Endo/Other  negative endocrine ROS  Renal/GU negative Renal ROS  negative genitourinary   Musculoskeletal negative musculoskeletal ROS (+)   Abdominal Normal abdominal exam  (+)   Peds negative pediatric ROS (+)  Hematology negative hematology ROS (+)   Anesthesia Other Findings   Reproductive/Obstetrics negative OB ROS                           Anesthesia Physical Anesthesia Plan  ASA: II  Anesthesia Plan: General   Post-op Pain Management:    Induction: Intravenous  Airway Management Planned: Oral ETT  Additional Equipment:   Intra-op Plan:   Post-operative Plan: Extubation in OR  Informed Consent: I have reviewed the patients History and Physical, chart, labs and discussed the procedure including the risks, benefits and alternatives for the proposed anesthesia with the patient or authorized representative who has indicated his/her understanding and acceptance.   Dental Advisory Given  Plan Discussed with: CRNA and Surgeon  Anesthesia Plan Comments:         Anesthesia Quick Evaluation  

## 2012-05-02 NOTE — Op Note (Signed)
05/02/2012  12:11 PM  PATIENT:  Wendy [name]  36 y.o. female  PRE-OPERATIVE DIAGNOSIS:  Desires sterilization  POST-OPERATIVE DIAGNOSIS:  Desires sterilization  PROCEDURE:  Procedure(s) (LRB) with comments: LAPAROSCOPIC TUBAL LIGATION (Bilateral) - with filshie clips  SURGEON:  Surgeon(s) and Role:    * Allie Bossier, MD - Primary  PHYSICIAN ASSISTANT:   ASSISTANTS: none   ANESTHESIA:   general  EBL:  Total I/O In: 700 [I.V.:700] Out: 10 [Blood:10]  BLOOD ADMINISTERED:none  DRAINS: none   LOCAL MEDICATIONS USED:  MARCAINE     SPECIMEN:  No Specimen  DISPOSITION OF SPECIMEN:  N/A  COUNTS:  YES  TOURNIQUET:  * No tourniquets in log *  DICTATION: .Dragon Dictation  PLAN OF CARE: Discharge to home after PACU  PATIENT DISPOSITION:  PACU - hemodynamically stable.   Delay start of Pharmacological VTE agent (>24hrs) due to surgical blood loss or risk of bleeding: not applicable  The risks, benefits, alternatives of surgery were explained understood, accepted. All questions were answered. She understands the failure rate of 1/200. She declines alternative forms of birth control. Her urine pregnancy test was negative. She was taken to the operating room and general anesthesia was applied without complication. She was placed in dorsal lithotomy position. Her abdomen and vagina were prepped and draped in the usual sterile fashion. A time out procedure was done. A bimanual exam revealed a normal, size and shape mobile uterus with nonenlarged adnexa. A Hulka manipulator was placed on the cervix. Gloves were changed and attention was turned to the abdomen. Approximately 10 mL of 0.5% Marcaine was used to infiltrate the subcutaneous tissue at the umbilicus. A vertical 1 cm incision was made. A Veres needle was placed in the pelvis. Low-flow CO2 was used to insufflate the abdomen to approximately 3 L. After good pneumoperitoneum was established, a 11 mm trocar was placed. Laparoscopy  confirmed correct placement. She was placed in the Trendelenburg position. Patient abdominal pressure was always less than 15. Her uterus ovaries and tubes appeared normal. There was no evidence of endometriosis. The oviducts were visually traced to their fimbriated end on each side. In the isthmic region of each oviduct, a Filshie clip was placed across the entire oviduct. There was no bleeding. The CO2 was allowed to escape from her abdomen. The umbilical fascia was closed with a figure of 0 Vicryl suture. No defects were palpable. The subcuticular closure was done with 4-0 Vicryl suture. The Hulka manipulator was removed. She was extubated and taken to recovery in stable condition. She tolerated the procedure well. The instrument, sponge, and needle counts were correct.

## 2012-05-03 ENCOUNTER — Encounter (HOSPITAL_COMMUNITY): Payer: Self-pay | Admitting: Obstetrics & Gynecology

## 2012-06-23 NOTE — Anesthesia Postprocedure Evaluation (Signed)
Anesthesia Post Note  Patient: Wendy [name]  Procedure(s) Performed: Procedure(s) (LRB): LAPAROSCOPIC TUBAL LIGATION (Bilateral)  Anesthesia type: GA  Patient location: PACU  Post pain: Pain level controlled  Post assessment: Post-op Vital signs reviewed  Last Vitals:  Filed Vitals:   05/02/12 1310  BP:   Pulse:   Temp: 36.8 C  Resp:     Post vital signs: Reviewed  Level of consciousness: sedated  Complications: No apparent anesthesia complications

## 2012-06-29 ENCOUNTER — Ambulatory Visit (INDEPENDENT_AMBULATORY_CARE_PROVIDER_SITE_OTHER): Payer: Self-pay | Admitting: Obstetrics & Gynecology

## 2012-06-29 ENCOUNTER — Encounter: Payer: Self-pay | Admitting: Obstetrics & Gynecology

## 2012-06-29 VITALS — BP 102/66 | HR 73 | Ht 63.0 in | Wt 162.0 lb

## 2012-06-29 DIAGNOSIS — Z09 Encounter for follow-up examination after completed treatment for conditions other than malignant neoplasm: Secondary | ICD-10-CM

## 2012-06-29 NOTE — Progress Notes (Signed)
  Subjective:    Patient ID: Wendy [name], female    DOB: [birth year], 37 y.o.   MRN: [id number]  HPI Wendy [name] is now 7 weeks post op s/p BTL. She has no problems. She was back to her kickboxing class within 4 days of surgery. She has had 2 periods since her BTL.   Review of Systems  She has had a flu vaccine this season. She had her last pap at another office and she will find out when her next one is due and then schedule an annual.    Objective:   Physical Exam  Well-healed umbilical incision      Assessment & Plan:  Post op doing well. Schedule annual prn

## 2012-11-07 ENCOUNTER — Encounter: Payer: Self-pay | Admitting: Obstetrics and Gynecology

## 2012-11-07 ENCOUNTER — Ambulatory Visit (INDEPENDENT_AMBULATORY_CARE_PROVIDER_SITE_OTHER): Payer: 59 | Admitting: Obstetrics and Gynecology

## 2012-11-07 VITALS — BP 121/75 | HR 89 | Resp 16 | Ht 63.0 in | Wt 165.0 lb

## 2012-11-07 DIAGNOSIS — S3141XA Laceration without foreign body of vagina and vulva, initial encounter: Secondary | ICD-10-CM

## 2012-11-07 DIAGNOSIS — S3140XA Unspecified open wound of vagina and vulva, initial encounter: Secondary | ICD-10-CM

## 2012-11-07 NOTE — Progress Notes (Signed)
Patient ID: Wendy [name], female   DOB: [birth year], 37 y.o.   MRN: [id number] 37 yo G2P2 using BTL for contraception presenting today for evaluation of vaginal soreness. Patient reports having vaginal intercourse over the past weekend and doesn't remember much of that experience secondary to alcohol intoxication. She reports feeling very sore the following morning and noted a large amount of blood on the bed sheets. Patient states that the bleeding stopped the following day and she started a regular 3 day period. Her vaginal soreness is slowly improving. Patient presents today just wanting to make sure that everything is ok.  GENERAL: Well-developed, well-nourished female in no acute distress.  PELVIC: Normal external female genitalia with healing skin abrasion on right labia major. Vagina is pink and rugated with healing abrasion on posterior vaginal wall.  Normal discharge. Normal appearing cervix. Uterus is normal in size.  No adnexal mass or tenderness. EXTREMITIES: No cyanosis, clubbing, or edema, 2+ distal pulses.  A/P 37 yo G2P2 with healing vaginal laceration - Reassurance provided - RTC for annual exam

## 2013-09-05 ENCOUNTER — Ambulatory Visit (INDEPENDENT_AMBULATORY_CARE_PROVIDER_SITE_OTHER): Payer: 59 | Admitting: Obstetrics & Gynecology

## 2013-09-05 ENCOUNTER — Encounter: Payer: Self-pay | Admitting: Obstetrics & Gynecology

## 2013-09-05 VITALS — BP 111/79 | HR 74 | Ht 63.0 in | Wt 175.0 lb

## 2013-09-05 DIAGNOSIS — R5381 Other malaise: Secondary | ICD-10-CM

## 2013-09-05 DIAGNOSIS — Z124 Encounter for screening for malignant neoplasm of cervix: Secondary | ICD-10-CM

## 2013-09-05 DIAGNOSIS — Z1151 Encounter for screening for human papillomavirus (HPV): Secondary | ICD-10-CM

## 2013-09-05 DIAGNOSIS — Z01419 Encounter for gynecological examination (general) (routine) without abnormal findings: Secondary | ICD-10-CM

## 2013-09-05 DIAGNOSIS — Z113 Encounter for screening for infections with a predominantly sexual mode of transmission: Secondary | ICD-10-CM

## 2013-09-05 DIAGNOSIS — R5383 Other fatigue: Secondary | ICD-10-CM

## 2013-09-05 DIAGNOSIS — Z0000 Encounter for general adult medical examination without abnormal findings: Secondary | ICD-10-CM

## 2013-09-05 LAB — CBC
HEMATOCRIT: 40.1 % (ref 36.0–46.0)
Hemoglobin: 13.7 g/dL (ref 12.0–15.0)
MCH: 30.6 pg (ref 26.0–34.0)
MCHC: 34.2 g/dL (ref 30.0–36.0)
MCV: 89.7 fL (ref 78.0–100.0)
PLATELETS: 198 10*3/uL (ref 150–400)
RBC: 4.47 MIL/uL (ref 3.87–5.11)
RDW: 13.3 % (ref 11.5–15.5)
WBC: 6.3 10*3/uL (ref 4.0–10.5)

## 2013-09-05 NOTE — Progress Notes (Signed)
Subjective:    Wendy [name] is a 38 y.o. female who presents for an annual exam. The patient has no complaints today. The patient is not currently sexually active. GYN screening history: last pap: was normal. The patient wears seatbelts: yes. The patient participates in regular exercise: yes. Has the patient ever been transfused or tattooed?: yes. The patient reports that there is not domestic violence in her life.   Menstrual History: OB History   Grav Para Term Preterm Abortions TAB SAB Ect Mult Living   2 2        2       Menarche age: 73  Patient's last menstrual period was 08/19/2013.    The following portions of the patient's history were reviewed and updated as appropriate: allergies, current medications, past family history, past medical history, past social history, past surgical history and problem list.  Review of Systems A comprehensive review of systems was negative. 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 Has 2 daughters, 66 and 60 yo.   Objective:    BP 111/79  Pulse 74  Ht 5\' 3"  (1.6 m)  Wt 175 lb (79.379 kg)  BMI 31.01 kg/m2  LMP 08/19/2013  General Appearance:    Alert, cooperative, no distress, appears stated age  Head:    Normocephalic, without obvious abnormality, atraumatic  Eyes:    PERRL, conjunctiva/corneas clear, EOM's intact, fundi    benign, both eyes  Ears:    Normal TM's and external ear canals, both ears  Nose:   Nares normal, septum midline, mucosa normal, no drainage    or sinus tenderness  Throat:   Lips, mucosa, and tongue normal; teeth and gums normal  Neck:   Supple, symmetrical, trachea midline, no adenopathy;    thyroid:  no enlargement/tenderness/nodules; no carotid   bruit or JVD  Back:     Symmetric, no curvature, ROM normal, no CVA tenderness  Lungs:     Clear to auscultation bilaterally, respirations unlabored  Chest Wall:    No tenderness or deformity   Heart:    Regular rate and rhythm, S1 and S2 normal, no murmur, rub   or gallop   Breast Exam:    No tenderness, masses, or nipple abnormality  Abdomen:     Soft, non-tender, bowel sounds active all four quadrants,    no masses, no organomegaly  Genitalia:    Normal female without lesion, discharge or tenderness, ULN size uterus, mobile, NT, normal adnexal exam     Extremities:   Extremities normal, atraumatic, no cyanosis or edema  Pulses:   2+ and symmetric all extremities  Skin:   Skin color, texture, turgor normal, no rashes or lesions  Lymph nodes:   Cervical, supraclavicular, and axillary nodes normal  Neurologic:   CNII-XII intact, normal strength, sensation and reflexes    throughout  .    Assessment:    Healthy female exam.    Plan:     Chlamydia specimen. GC specimen. Thin prep Pap smear. fasting labs

## 2013-09-05 NOTE — Patient Instructions (Signed)

## 2013-09-06 LAB — COMPREHENSIVE METABOLIC PANEL
ALT: 8 U/L (ref 0–35)
AST: 11 U/L (ref 0–37)
Albumin: 4.3 g/dL (ref 3.5–5.2)
Alkaline Phosphatase: 55 U/L (ref 39–117)
BUN: 7 mg/dL (ref 6–23)
CALCIUM: 9.2 mg/dL (ref 8.4–10.5)
CHLORIDE: 103 meq/L (ref 96–112)
CO2: 25 meq/L (ref 19–32)
Creat: 0.62 mg/dL (ref 0.50–1.10)
Glucose, Bld: 87 mg/dL (ref 70–99)
Potassium: 4 mEq/L (ref 3.5–5.3)
SODIUM: 137 meq/L (ref 135–145)
TOTAL PROTEIN: 6.8 g/dL (ref 6.0–8.3)
Total Bilirubin: 0.6 mg/dL (ref 0.2–1.2)

## 2013-09-06 LAB — LIPID PANEL
Cholesterol: 160 mg/dL (ref 0–200)
HDL: 63 mg/dL (ref >39)
LDL CALC: 86 mg/dL (ref 0–99)
Total CHOL/HDL Ratio: 2.5 Ratio
Triglycerides: 56 mg/dL (ref <150)
VLDL: 11 mg/dL (ref 0–40)

## 2013-09-06 LAB — HEPATITIS B SURFACE ANTIGEN: Hepatitis B Surface Ag: NEGATIVE

## 2013-09-06 LAB — HEPATITIS C ANTIBODY: HCV Ab: NEGATIVE

## 2013-09-06 LAB — RPR

## 2013-09-06 LAB — HIV ANTIBODY (ROUTINE TESTING W REFLEX): HIV: NONREACTIVE

## 2013-09-06 LAB — TSH: TSH: 1.306 u[IU]/mL (ref 0.350–4.500)

## 2014-01-14 ENCOUNTER — Ambulatory Visit (INDEPENDENT_AMBULATORY_CARE_PROVIDER_SITE_OTHER): Payer: 59 | Admitting: Obstetrics & Gynecology

## 2014-01-14 ENCOUNTER — Encounter: Payer: Self-pay | Admitting: Obstetrics & Gynecology

## 2014-01-14 VITALS — BP 107/69 | HR 76 | Ht 63.0 in | Wt 187.2 lb

## 2014-01-14 DIAGNOSIS — A499 Bacterial infection, unspecified: Secondary | ICD-10-CM

## 2014-01-14 DIAGNOSIS — N898 Other specified noninflammatory disorders of vagina: Secondary | ICD-10-CM

## 2014-01-14 DIAGNOSIS — N760 Acute vaginitis: Secondary | ICD-10-CM

## 2014-01-14 DIAGNOSIS — N9489 Other specified conditions associated with female genital organs and menstrual cycle: Secondary | ICD-10-CM

## 2014-01-14 DIAGNOSIS — B9689 Other specified bacterial agents as the cause of diseases classified elsewhere: Secondary | ICD-10-CM

## 2014-01-14 NOTE — Patient Instructions (Addendum)
Return to clinic for any scheduled appointments or for any gynecologic concerns as needed.   RE: MyChart  Dear Wendy [name]  We are excited to introduce MyChart, a new best-in-class service that provides you online access to important information in your electronic medical record. We want to make it easier for you to view your health information - all in one secure location - when and where you need it. We expect MyChart will enhance the quality of care and service we provide. Use the activation code below to enroll in MyChart online at [email or web address]  When you register for MyChart, you can:    View your test results.   Communicate securely with your physician's office.    View your medical history, allergies, medications, and immunizations.   Conveniently print information such as your medication lists.  If you are age 38 or older and want a member of your family to have access to your record, you must provide written consent by completing a proxy form available at our facility. Please speak to our clinical staff about guidelines regarding accounts for patients younger than age 40.  As you activate your MyChart account and need any technical assistance, please call the MyChart technical support line at [phone]) or email your question to [email or web address]. If you email your question(s), please include your name, a return phone number and the best time to reach you.  Thank you for using MyChart as your new health and wellness resource!  MyChart Activation Code:  [email or web address] Expires: 03/15/2014  4:33 PM

## 2014-01-14 NOTE — Progress Notes (Signed)
   CLINIC ENCOUNTER NOTE  History:  38 y.o. G2P2 here today for evaluation of vaginal odor. No irritation, no abnormal discharge. Just occurs after vaginal intercourse with a specific partner. Partner is circumcised.  The following portions of the patient's history were reviewed and updated as appropriate: allergies, current medications, past family history, past medical history, past social history, past surgical history and problem list.  Normal pap and negative HRHPV in 09/05/13.  Review of Systems:  Pertinent items are noted in HPI.  Objective:  Physical Exam BP 107/69  Pulse 76  Ht 5\' 3"  (1.6 m)  Wt 187 lb 3.2 oz (84.913 kg)  BMI 33.17 kg/m2  LMP 12/27/2013 Gen: NAD Abd: Soft, nontender and nondistended Pelvic: Normal appearing external genitalia; normal appearing vaginal mucosa and cervix.  Thin, white discharge seen; sample obtained for wet prep.  Small uterus, no other palpable masses, no uterine or adnexal tenderness  Assessment & Plan:  Wet prep obtained; will follow up results and manage accordingly. Proper vulvar hygiene emphasized: discussed avoidance of perfumed soaps, detergents, lotions and any type of douches; in addition to wearing cotton underwear and no underwear at night.   Patient also informed that this problem could be due to her partner, this seems to only happen after intercourse with this particular partner. Will continue to observe.    Verita Schneiders, MD, [facility] [employer], [place]

## 2014-01-15 LAB — WET PREP, GENITAL
Trich, Wet Prep: NONE SEEN
WBC WET PREP: NONE SEEN
YEAST WET PREP: NONE SEEN

## 2014-01-15 MED ORDER — METRONIDAZOLE 0.75 % VA GEL: 1.0000 | Freq: Every day | VAGINAL | Status: DC

## 2014-01-15 NOTE — Addendum Note (Signed)
Addended by: Verita Schneiders A on: 01/15/2014 09:12 AM   Modules accepted: Orders

## 2014-01-24 ENCOUNTER — Ambulatory Visit: Payer: Self-pay | Admitting: General Practice

## 2014-04-21 ENCOUNTER — Encounter: Payer: Self-pay | Admitting: Obstetrics & Gynecology

## 2014-09-09 ENCOUNTER — Ambulatory Visit: Payer: Self-pay | Admitting: General Practice

## 2015-11-05 ENCOUNTER — Ambulatory Visit (INDEPENDENT_AMBULATORY_CARE_PROVIDER_SITE_OTHER): Payer: BLUE CROSS/BLUE SHIELD | Admitting: Physician Assistant

## 2015-11-05 ENCOUNTER — Encounter: Payer: Self-pay | Admitting: Physician Assistant

## 2015-11-05 VITALS — BP 110/70 | HR 83 | Temp 98.2°F | Resp 16 | Ht 64.0 in | Wt 181.4 lb

## 2015-11-05 DIAGNOSIS — G43829 Menstrual migraine, not intractable, without status migrainosus: Secondary | ICD-10-CM | POA: Diagnosis not present

## 2015-11-05 DIAGNOSIS — Z124 Encounter for screening for malignant neoplasm of cervix: Secondary | ICD-10-CM | POA: Diagnosis not present

## 2015-11-05 DIAGNOSIS — F900 Attention-deficit hyperactivity disorder, predominantly inattentive type: Secondary | ICD-10-CM

## 2015-11-05 DIAGNOSIS — N943 Premenstrual tension syndrome: Secondary | ICD-10-CM

## 2015-11-05 DIAGNOSIS — Z113 Encounter for screening for infections with a predominantly sexual mode of transmission: Secondary | ICD-10-CM

## 2015-11-05 DIAGNOSIS — R32 Unspecified urinary incontinence: Secondary | ICD-10-CM | POA: Diagnosis not present

## 2015-11-05 DIAGNOSIS — K5900 Constipation, unspecified: Secondary | ICD-10-CM | POA: Diagnosis not present

## 2015-11-05 DIAGNOSIS — Z1239 Encounter for other screening for malignant neoplasm of breast: Secondary | ICD-10-CM

## 2015-11-05 DIAGNOSIS — F411 Generalized anxiety disorder: Secondary | ICD-10-CM | POA: Insufficient documentation

## 2015-11-05 DIAGNOSIS — F909 Attention-deficit hyperactivity disorder, unspecified type: Secondary | ICD-10-CM | POA: Insufficient documentation

## 2015-11-05 DIAGNOSIS — Z7689 Persons encountering health services in other specified circumstances: Secondary | ICD-10-CM

## 2015-11-05 DIAGNOSIS — E669 Obesity, unspecified: Secondary | ICD-10-CM | POA: Insufficient documentation

## 2015-11-05 DIAGNOSIS — Z0000 Encounter for general adult medical examination without abnormal findings: Secondary | ICD-10-CM

## 2015-11-05 DIAGNOSIS — Z7189 Other specified counseling: Secondary | ICD-10-CM

## 2015-11-05 MED ORDER — ALPRAZOLAM 0.25 MG PO TABS: 0.2500 mg | ORAL_TABLET | Freq: Every evening | ORAL | Status: DC | PRN

## 2015-11-05 NOTE — Patient Instructions (Signed)
Health Maintenance, Female Adopting a healthy lifestyle and getting preventive care can go a long way to promote health and wellness. Talk with your health care provider about what schedule of regular examinations is right for you. This is a good chance for you to check in with your provider about disease prevention and staying healthy. In between checkups, there are plenty of things you can do on your own. Experts have done a lot of research about which lifestyle changes and preventive measures are most likely to keep you healthy. Ask your health care provider for more information. WEIGHT AND DIET  Eat a healthy diet  Be sure to include plenty of vegetables, fruits, low-fat dairy products, and lean protein.  Do not eat a lot of foods high in solid fats, added sugars, or salt.  Get regular exercise. This is one of the most important things you can do for your health.  Most adults should exercise for at least 150 minutes each week. The exercise should increase your heart rate and make you sweat (moderate-intensity exercise).  Most adults should also do strengthening exercises at least twice a week. This is in addition to the moderate-intensity exercise.  Maintain a healthy weight  Body mass index (BMI) is a measurement that can be used to identify possible weight problems. It estimates body fat based on height and weight. Your health care provider can help determine your BMI and help you achieve or maintain a healthy weight.  For females 28 years of age and older:   A BMI below 18.5 is considered underweight.  A BMI of 18.5 to 24.9 is normal.  A BMI of 25 to 29.9 is considered overweight.  A BMI of 30 and above is considered obese.  Watch levels of cholesterol and blood lipids  You should start having your blood tested for lipids and cholesterol at 40 years of age, then have this test every 5 years.  You may need to have your cholesterol levels checked more often if:  Your lipid  or cholesterol levels are high.  You are older than 40 years of age.  You are at high risk for heart disease.  CANCER SCREENING   Lung Cancer  Lung cancer screening is recommended for adults 75-66 years old who are at high risk for lung cancer because of a history of smoking.  A yearly low-dose CT scan of the lungs is recommended for people who:  Currently smoke.  Have quit within the past 15 years.  Have at least a 30-pack-year history of smoking. A pack year is smoking an average of one pack of cigarettes a day for 1 year.  Yearly screening should continue until it has been 15 years since you quit.  Yearly screening should stop if you develop a health problem that would prevent you from having lung cancer treatment.  Breast Cancer  Practice breast self-awareness. This means understanding how your breasts normally appear and feel.  It also means doing regular breast self-exams. Let your health care provider know about any changes, no matter how small.  If you are in your 20s or 30s, you should have a clinical breast exam (CBE) by a health care provider every 1-3 years as part of a regular health exam.  If you are 25 or older, have a CBE every year. Also consider having a breast X-ray (mammogram) every year.  If you have a family history of breast cancer, talk to your health care provider about genetic screening.  If you  are at high risk for breast cancer, talk to your health care provider about having an MRI and a mammogram every year.  Breast cancer gene (BRCA) assessment is recommended for women who have family members with BRCA-related cancers. BRCA-related cancers include:  Breast.  Ovarian.  Tubal.  Peritoneal cancers.  Results of the assessment will determine the need for genetic counseling and BRCA1 and BRCA2 testing. Cervical Cancer Your health care provider may recommend that you be screened regularly for cancer of the pelvic organs (ovaries, uterus, and  vagina). This screening involves a pelvic examination, including checking for microscopic changes to the surface of your cervix (Pap test). You may be encouraged to have this screening done every 3 years, beginning at age 21.  For women ages 30-65, health care providers may recommend pelvic exams and Pap testing every 3 years, or they may recommend the Pap and pelvic exam, combined with testing for human papilloma virus (HPV), every 5 years. Some types of HPV increase your risk of cervical cancer. Testing for HPV may also be done on women of any age with unclear Pap test results.  Other health care providers may not recommend any screening for nonpregnant women who are considered low risk for pelvic cancer and who do not have symptoms. Ask your health care provider if a screening pelvic exam is right for you.  If you have had past treatment for cervical cancer or a condition that could lead to cancer, you need Pap tests and screening for cancer for at least 20 years after your treatment. If Pap tests have been discontinued, your risk factors (such as having a new sexual partner) need to be reassessed to determine if screening should resume. Some women have medical problems that increase the chance of getting cervical cancer. In these cases, your health care provider may recommend more frequent screening and Pap tests. Colorectal Cancer  This type of cancer can be detected and often prevented.  Routine colorectal cancer screening usually begins at 40 years of age and continues through 40 years of age.  Your health care provider may recommend screening at an earlier age if you have risk factors for colon cancer.  Your health care provider may also recommend using home test kits to check for hidden blood in the stool.  A small camera at the end of a tube can be used to examine your colon directly (sigmoidoscopy or colonoscopy). This is done to check for the earliest forms of colorectal  cancer.  Routine screening usually begins at age 50.  Direct examination of the colon should be repeated every 5-10 years through 40 years of age. However, you may need to be screened more often if early forms of precancerous polyps or small growths are found. Skin Cancer  Check your skin from head to toe regularly.  Tell your health care provider about any new moles or changes in moles, especially if there is a change in a mole's shape or color.  Also tell your health care provider if you have a mole that is larger than the size of a pencil eraser.  Always use sunscreen. Apply sunscreen liberally and repeatedly throughout the day.  Protect yourself by wearing long sleeves, pants, a wide-brimmed hat, and sunglasses whenever you are outside. HEART DISEASE, DIABETES, AND HIGH BLOOD PRESSURE   High blood pressure causes heart disease and increases the risk of stroke. High blood pressure is more likely to develop in:  People who have blood pressure in the high end   of the normal range (130-139/85-89 mm Hg).  People who are overweight or obese.  People who are African American.  If you are 38-23 years of age, have your blood pressure checked every 3-5 years. If you are 61 years of age or older, have your blood pressure checked every year. You should have your blood pressure measured twice--once when you are at a hospital or clinic, and once when you are not at a hospital or clinic. Record the average of the two measurements. To check your blood pressure when you are not at a hospital or clinic, you can use:  An automated blood pressure machine at a pharmacy.  A home blood pressure monitor.  If you are between 45 years and 39 years old, ask your health care provider if you should take aspirin to prevent strokes.  Have regular diabetes screenings. This involves taking a blood sample to check your fasting blood sugar level.  If you are at a normal weight and have a low risk for diabetes,  have this test once every three years after 40 years of age.  If you are overweight and have a high risk for diabetes, consider being tested at a younger age or more often. PREVENTING INFECTION  Hepatitis B  If you have a higher risk for hepatitis B, you should be screened for this virus. You are considered at high risk for hepatitis B if:  You were born in a country where hepatitis B is common. Ask your health care provider which countries are considered high risk.  Your parents were born in a high-risk country, and you have not been immunized against hepatitis B (hepatitis B vaccine).  You have HIV or AIDS.  You use needles to inject street drugs.  You live with someone who has hepatitis B.  You have had sex with someone who has hepatitis B.  You get hemodialysis treatment.  You take certain medicines for conditions, including cancer, organ transplantation, and autoimmune conditions. Hepatitis C  Blood testing is recommended for:  Everyone born from [birth year] through 1965.  Anyone with known risk factors for hepatitis C. Sexually transmitted infections (STIs)  You should be screened for sexually transmitted infections (STIs) including gonorrhea and chlamydia if:  You are sexually active and are younger than 40 years of age.  You are older than 40 years of age and your health care provider tells you that you are at risk for this type of infection.  Your sexual activity has changed since you were last screened and you are at an increased risk for chlamydia or gonorrhea. Ask your health care provider if you are at risk.  If you do not have HIV, but are at risk, it may be recommended that you take a prescription medicine daily to prevent HIV infection. This is called pre-exposure prophylaxis (PrEP). You are considered at risk if:  You are sexually active and do not regularly use condoms or know the HIV status of your partner(s).  You take drugs by injection.  You are sexually  active with a partner who has HIV. Talk with your health care provider about whether you are at high risk of being infected with HIV. If you choose to begin PrEP, you should first be tested for HIV. You should then be tested every 3 months for as long as you are taking PrEP.  PREGNANCY   If you are premenopausal and you may become pregnant, ask your health care provider about preconception counseling.  If you may  become pregnant, take 400 to 800 micrograms (mcg) of folic acid every day.  If you want to prevent pregnancy, talk to your health care provider about birth control (contraception). OSTEOPOROSIS AND MENOPAUSE   Osteoporosis is a disease in which the bones lose minerals and strength with aging. This can result in serious bone fractures. Your risk for osteoporosis can be identified using a bone density scan.  If you are 61 years of age or older, or if you are at risk for osteoporosis and fractures, ask your health care provider if you should be screened.  Ask your health care provider whether you should take a calcium or vitamin D supplement to lower your risk for osteoporosis.  Menopause may have certain physical symptoms and risks.  Hormone replacement therapy may reduce some of these symptoms and risks. Talk to your health care provider about whether hormone replacement therapy is right for you.  HOME CARE INSTRUCTIONS   Schedule regular health, dental, and eye exams.  Stay current with your immunizations.   Do not use any tobacco products including cigarettes, chewing tobacco, or electronic cigarettes.  If you are pregnant, do not drink alcohol.  If you are breastfeeding, limit how much and how often you drink alcohol.  Limit alcohol intake to no more than 1 drink per day for nonpregnant women. One drink equals 12 ounces of beer, 5 ounces of wine, or 1 ounces of hard liquor.  Do not use street drugs.  Do not share needles.  Ask your health care provider for help if  you need support or information about quitting drugs.  Tell your health care provider if you often feel depressed.  Tell your health care provider if you have ever been abused or do not feel safe at home.   This information is not intended to replace advice given to you by your health care provider. Make sure you discuss any questions you have with your health care provider.   Document Released: 12/20/2010 Document Revised: 06/27/2014 Document Reviewed: 05/08/2013 Elsevier Interactive Patient Education [employer].

## 2015-11-05 NOTE — Progress Notes (Signed)
Patient: Wendy [name], Female    DOB: November 25, 1975, 40 y.o.   MRN: [id number] Visit Date: 11/05/2015  Today's Provider: Mar Daring, PA-C   Chief Complaint  Patient presents with  . New Patient (Initial Visit)  . Annual Exam   Subjective:    Annual physical exam Wendy [name] is a 40 y.o. female new patient who presents today for health maintenance and complete physical. She feels well. She reports exercising runs off and on. She reports she is sleeping fairly well.  She reports getting headaches every month a week before her menstrual cycle. Mostly a throbbing headache associated with nausea and eye pressure. She is normally able to control it with Ibuprofen.  She also is concern about incontinence problem that has been there for a while. She states when she was a young child to teenager she would have episodes of enuresis. She has continued to have incontinence issues that worsened after the birth of her children. Sometimes she does not even realize it has happened. She has been worked up in the past and was told she has a spastic bladder. She also reports constipation as well, stating she only has a BM once every other day to every three days.   She does have ADHD and is stable on Adderall XR 15mg  every morning. She also has anxiety and depression. She is currently on Wellbutrin 300mg  XR and is doing well on that dose also.   Mammogram:2016 at [facility] Pap: cannot remember ? 2015. 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 Most recent Pap was WNL. Colonoscopy: N/A -----------------------------------------------------------------   Review of Systems  Constitutional: Negative.   Eyes: Negative.   Respiratory: Negative.   Cardiovascular: Negative.   Gastrointestinal: Negative.   Endocrine: Negative.   Genitourinary: Negative.   Musculoskeletal: Negative.   Skin: Negative.   Allergic/Immunologic: Positive for environmental  allergies and food allergies.  Neurological: Positive for headaches.  Hematological: Negative.   Psychiatric/Behavioral: Negative.     Social History      She  reports that she has never smoked. She does not have any smokeless tobacco history on file. She reports that she drinks alcohol. She reports that she does not use illicit drugs.       Social History   Social History  . Marital Status: Married    Spouse Name: N/A  . Number of Children: N/A  . Years of Education: N/A   Social History Main Topics  . Smoking status: Never Smoker   . Smokeless tobacco: None  . Alcohol Use: Yes     Comment: occasion  . Drug Use: No  . Sexual Activity:    Partners: Male    Birth Control/ Protection: Surgical     Comment: Tubaligation   Other Topics Concern  . None   Social History Narrative    Past Medical History  Diagnosis Date  . Allergy   . Anxiety   . Depression   . Heart murmur     slight  . Abnormal Pap smear of cervix 10/2011     Patient Active Problem List   Diagnosis Date Noted  . Contraception management 05/02/2012  . Depression 02/13/2012    Past Surgical History  Procedure Laterality Date  . Cesarean section  2004    x1  . Wisdom tooth extraction      x4  . Wrist surgery      rigth cyst removal.  . Laparoscopic  tubal ligation  05/02/2012    Procedure: LAPAROSCOPIC TUBAL LIGATION;  Surgeon: Emily Filbert, MD;  Location: [facility];  Service: Gynecology;  Laterality: Bilateral;  with filshie clips  . Tubal ligation  04/2012    Family History        Family Status  Relation Status Death Age  . Mother Alive   . Father Alive   . Maternal Grandmother Alive   . Maternal Grandfather Deceased   . Paternal Grandmother Alive   . Paternal Grandfather Deceased         Her family history includes Alcohol abuse in her father; Arthritis in her mother; Asthma in her paternal grandmother; Emphysema in her maternal grandfather; Heart disease in her father, maternal  grandfather, maternal grandmother, and paternal grandfather; Hypothyroidism in her mother.    Allergies  Allergen Reactions  . Peanuts [Peanut Oil] Shortness Of Breath  . Banana Itching  . Codeine Nausea And Vomiting  . Eggs Or Egg-Derived Products Nausea And Vomiting  . Vicodin [Hydrocodone-Acetaminophen] Nausea And Vomiting    Previous Medications   AMPHETAMINE-DEXTROAMPHETAMINE (ADDERALL PO)    Take by mouth.   BUPROPION (WELLBUTRIN XL) 300 MG 24 HR TABLET    Take 300 mg by mouth daily.   MULTIPLE VITAMIN (MULTI VITAMIN DAILY PO)    Take by mouth.    Patient Care Team: Mar Daring, PA-C as PCP - General (Family Medicine)     Objective:   Vitals: BP 110/70 mmHg  Pulse 83  Temp(Src) 98.2 F (36.8 C) (Oral)  Resp 16  Ht 5\' 4"  (1.626 m)  Wt 181 lb 6.4 oz (82.283 kg)  BMI 31.12 kg/m2   Physical Exam  Constitutional: She is oriented to person, place, and time. She appears well-developed and well-nourished. No distress.  HENT:  Head: Normocephalic and atraumatic.  Right Ear: Hearing, tympanic membrane, external ear and ear canal normal.  Left Ear: Hearing, tympanic membrane, external ear and ear canal normal.  Nose: Nose normal.  Mouth/Throat: Uvula is midline, oropharynx is clear and moist and mucous membranes are normal. No oropharyngeal exudate.  Eyes: Conjunctivae and EOM are normal. Pupils are equal, round, and reactive to light. Right eye exhibits no discharge. Left eye exhibits no discharge. No scleral icterus.  Neck: Normal range of motion. Neck supple. No JVD present. Carotid bruit is not present. No tracheal deviation present. No thyromegaly present.  Cardiovascular: Normal rate, regular rhythm, normal heart sounds and intact distal pulses.  Exam reveals no gallop and no friction rub.   No murmur heard. Pulmonary/Chest: Effort normal and breath sounds normal. No respiratory distress. She has no wheezes. She has no rales. She exhibits no tenderness. Right  breast exhibits no inverted nipple, no mass, no nipple discharge, no skin change and no tenderness. Left breast exhibits no inverted nipple, no mass, no nipple discharge, no skin change and no tenderness. Breasts are symmetrical.  Abdominal: Soft. Bowel sounds are normal. She exhibits no distension and no mass. There is no tenderness. There is no rebound and no guarding. Hernia confirmed negative in the right inguinal area and confirmed negative in the left inguinal area.  Genitourinary: Rectum normal, vagina normal and uterus normal. No breast swelling, tenderness, discharge or bleeding. Pelvic exam was performed with patient supine. There is no rash, tenderness, lesion or injury on the right labia. There is no rash, tenderness, lesion or injury on the left labia. Cervix exhibits no motion tenderness, no discharge and no friability. Right adnexum displays no mass,  no tenderness and no fullness. Left adnexum displays no mass, no tenderness and no fullness. No erythema, tenderness or bleeding in the vagina. No signs of injury around the vagina. No vaginal discharge found.  Musculoskeletal: Normal range of motion. She exhibits no edema or tenderness.  Lymphadenopathy:    She has no cervical adenopathy.       Right: No inguinal adenopathy present.       Left: No inguinal adenopathy present.  Neurological: She is alert and oriented to person, place, and time. She has normal reflexes. No cranial nerve deficit. Coordination normal.  Skin: Skin is warm and dry. No rash noted. She is not diaphoretic.  Psychiatric: She has a normal mood and affect. Her behavior is normal. Judgment and thought content normal.  Vitals reviewed.    Depression Screen No flowsheet data found.    Assessment & Plan:     Routine Health Maintenance and Physical Exam  Exercise Activities and Dietary recommendations Goals    None       There is no immunization history on file for this patient.  Health Maintenance    Topic Date Due  . TETANUS/TDAP  02/16/1995  . INFLUENZA VACCINE  01/19/2016  . PAP SMEAR  09/05/2016  . HIV Screening  Completed      Discussed health benefits of physical activity, and encouraged her to engage in regular exercise appropriate for her age and condition.   1. Annual physical exam Normal physical exam today. Will check labs as below and f/u pending lab results. If labs are stable and WNL she will not need to have these rechecked for one year at her next annual physical exam. She is to call the office in the meantime if she has any acute issue, questions or concerns. - CBC with Differential/Platelet - Comprehensive metabolic panel - Lipid panel - TSH  2. Establishing care with new doctor, encounter for  3. Breast cancer screening Breast exam today was normal. There is no family history of breast cancer. She does perform regular self breast exams. Mammogram was ordered as below. Information for [facility] clinic was given to patient so she may schedule her mammogram at her convenience. - Mammogram Digital Screening; Future  4. Cervical cancer screening Pap collected today. Will send as below and f/u pending results. - Pap IG, CT/NG NAA, and HPV (high risk)  5. GAD (generalized anxiety disorder) Stable with wellbutrin for depression, but reports increased anxiety and panic attacks that occur approx once or twice per week. Will add xanax as below to help for when she has those symptoms. Has taken previously and done well on it. She is to call if symptoms worsen. - ALPRAZolam (XANAX) 0.25 MG tablet; Take 1 tablet (0.25 mg total) by mouth at bedtime as needed for anxiety.  Dispense: 30 tablet; Refill: 0  6. Screen for STD (sexually transmitted disease) Will test as below and f/u pending results. - HIV antibody (with reflex) - RPR  7. Urinary incontinence in female Discussed timed toileting and pelvic floor therapy. She is not interested in OAB medications or pelvic  floor therapy but will try timed toileting to see if this helps. Will call if pelvic floor therapy is wanted.  8. Constipation, unspecified constipation type May be cause of incontinence. She is going to start a probiotic and fiber supplement. Call if symptoms worsen.  9. Menstrual migraine without status migrainosus, not intractable Discussed use of oral contraceptive to lessen symptoms or using a migraine reliever if needed.  Since she is able to control symptoms with IBU will continue that for the time being. Discussed adding magnesium 250mg  one tablet with headache or cramping onset. She is to call if migraines worsen and she is unable to control them.  --------------------------------------------------------------------

## 2015-11-06 LAB — CBC WITH DIFFERENTIAL/PLATELET
BASOS ABS: 0 10*3/uL (ref 0.0–0.2)
Basos: 0 %
EOS (ABSOLUTE): 0.1 10*3/uL (ref 0.0–0.4)
Eos: 1 %
Hematocrit: 40.3 % (ref 34.0–46.6)
Hemoglobin: 13.7 g/dL (ref 11.1–15.9)
IMMATURE GRANS (ABS): 0 10*3/uL (ref 0.0–0.1)
IMMATURE GRANULOCYTES: 0 %
LYMPHS: 22 %
Lymphocytes Absolute: 1.8 10*3/uL (ref 0.7–3.1)
MCH: 31 pg (ref 26.6–33.0)
MCHC: 34 g/dL (ref 31.5–35.7)
MCV: 91 fL (ref 79–97)
MONOCYTES: 7 %
Monocytes Absolute: 0.6 10*3/uL (ref 0.1–0.9)
NEUTROS PCT: 70 %
Neutrophils Absolute: 5.8 10*3/uL (ref 1.4–7.0)
PLATELETS: 252 10*3/uL (ref 150–379)
RBC: 4.42 x10E6/uL (ref 3.77–5.28)
RDW: 13.2 % (ref 12.3–15.4)
WBC: 8.3 10*3/uL (ref 3.4–10.8)

## 2015-11-06 LAB — COMPREHENSIVE METABOLIC PANEL
A/G RATIO: 1.9 (ref 1.2–2.2)
ALK PHOS: 56 IU/L (ref 39–117)
ALT: 12 IU/L (ref 0–32)
AST: 15 IU/L (ref 0–40)
Albumin: 4.5 g/dL (ref 3.5–5.5)
BUN/Creatinine Ratio: 16 (ref 9–23)
BUN: 11 mg/dL (ref 6–20)
Bilirubin Total: 0.6 mg/dL (ref 0.0–1.2)
CHLORIDE: 100 mmol/L (ref 96–106)
CO2: 23 mmol/L (ref 18–29)
Calcium: 9.3 mg/dL (ref 8.7–10.2)
Creatinine, Ser: 0.68 mg/dL (ref 0.57–1.00)
GFR calc Af Amer: 127 mL/min/{1.73_m2} (ref >59)
GFR calc non Af Amer: 111 mL/min/{1.73_m2} (ref >59)
GLOBULIN, TOTAL: 2.4 g/dL (ref 1.5–4.5)
Glucose: 90 mg/dL (ref 65–99)
POTASSIUM: 4.2 mmol/L (ref 3.5–5.2)
SODIUM: 139 mmol/L (ref 134–144)
Total Protein: 6.9 g/dL (ref 6.0–8.5)

## 2015-11-06 LAB — HIV ANTIBODY (ROUTINE TESTING W REFLEX): HIV SCREEN 4TH GENERATION: NONREACTIVE

## 2015-11-06 LAB — LIPID PANEL
CHOLESTEROL TOTAL: 170 mg/dL (ref 100–199)
Chol/HDL Ratio: 2.2 ratio units (ref 0.0–4.4)
HDL: 77 mg/dL (ref >39)
LDL Calculated: 83 mg/dL (ref 0–99)
TRIGLYCERIDES: 50 mg/dL (ref 0–149)
VLDL Cholesterol Cal: 10 mg/dL (ref 5–40)

## 2015-11-06 LAB — RPR: RPR Ser Ql: NONREACTIVE

## 2015-11-06 LAB — TSH: TSH: 1.54 u[IU]/mL (ref 0.450–4.500)

## 2015-11-12 MED ORDER — AMPHETAMINE-DEXTROAMPHET ER 15 MG PO CP24: 15.0000 mg | ORAL_CAPSULE | ORAL | Status: DC

## 2015-11-12 NOTE — Addendum Note (Signed)
Addended by: Mar Daring on: 11/12/2015 11:45 AM   Modules accepted: Orders

## 2015-11-17 LAB — PAP IG, CT-NG NAA, HPV HIGH-RISK
Chlamydia, Nuc. Acid Amp: NEGATIVE
Gonococcus by Nucleic Acid Amp: NEGATIVE
HPV, high-risk: NEGATIVE
PAP SMEAR COMMENT: 0

## 2015-11-18 ENCOUNTER — Telehealth: Payer: Self-pay

## 2015-11-18 NOTE — Telephone Encounter (Signed)
-----   Message from Mar Daring, [place] sent at 11/17/2015  4:39 PM EDT ----- Pap was abnormal with atypical squamous cells of undetermined significance, HPV negative, GC/Chlamydia negative. Recommendations is to just repeat pap in 1 year instead of 3.

## 2015-11-18 NOTE — Telephone Encounter (Signed)
Left message to call back  

## 2015-11-24 NOTE — Telephone Encounter (Signed)
LMTCB

## 2015-11-25 NOTE — Telephone Encounter (Signed)
Patient advised as directed below.  Thanks,  -Wendy [name] 

## 2016-03-11 ENCOUNTER — Encounter: Payer: Self-pay | Admitting: Physician Assistant

## 2016-03-11 ENCOUNTER — Ambulatory Visit (INDEPENDENT_AMBULATORY_CARE_PROVIDER_SITE_OTHER): Payer: BLUE CROSS/BLUE SHIELD | Admitting: Physician Assistant

## 2016-03-11 DIAGNOSIS — F411 Generalized anxiety disorder: Secondary | ICD-10-CM

## 2016-03-11 MED ORDER — ALPRAZOLAM 0.25 MG PO TABS: 0.2500 mg | ORAL_TABLET | Freq: Every evening | ORAL | 5 refills | Status: DC | PRN

## 2016-03-11 NOTE — Progress Notes (Signed)
Patient: Wendy [name] Female    DOB: [birth year]   40 y.o.   MRN: [id number] Visit Date: 03/11/2016  Today's Provider: Mar Daring, PA-C   Chief Complaint  Patient presents with  . ADHD   Subjective:    HPI  Patient is here today with c/o adjusting her ADHD medicine.She reports that she wants to come off the Adderall. She reports that she is having more chest tightness and is waking up with gasping breath. She has not taken her Adderall XR 15 MG 24 for two days and she has not had the gasping breath awaking her. She has been taking wellbutrin successfully. She has used xanax in the past for acute panic episodes, but she has been out for about 6 months. She does ok without it and is working on coping mechanisms and relaxation techniques but would like to have on hand in case she has a severe attack.  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 Also her ex-husband has started to become more involved with their two children again.     Allergies  Allergen Reactions  . Peanuts [Peanut Oil] Shortness Of Breath  . Banana Itching  . Codeine Nausea And Vomiting  . Eggs Or Egg-Derived Products Nausea And Vomiting  . Vicodin [Hydrocodone-Acetaminophen] Nausea And Vomiting     Current Outpatient Prescriptions:  .  buPROPion (WELLBUTRIN XL) 300 MG 24 hr tablet, Take 300 mg by mouth daily., Disp: , Rfl:  .  Multiple Vitamin (MULTI VITAMIN DAILY PO), Take by mouth., Disp: , Rfl:  .  ALPRAZolam (XANAX) 0.25 MG tablet, Take 1 tablet (0.25 mg total) by mouth at bedtime as needed for anxiety. (Patient not taking: Reported on 03/11/2016), Disp: 30 tablet, Rfl: 0 .  amphetamine-dextroamphetamine (ADDERALL XR) 15 MG 24 hr capsule, Take 1 capsule by mouth every morning. Do not fill until 01/13/16 (Patient not taking: Reported on 03/11/2016), Disp: 30 capsule, Rfl: 0  Review of Systems  Constitutional:  Negative.   Respiratory: Positive for chest tightness. Negative for cough, shortness of breath and wheezing.   Cardiovascular: Positive for palpitations. Negative for chest pain and leg swelling.  Gastrointestinal: Negative.   Neurological: Negative.   Psychiatric/Behavioral: Negative.     Social History  Substance Use Topics  . Smoking status: Never Smoker  . Smokeless tobacco: Not on file  . Alcohol use Yes     Comment: occasion   Objective:   BP 120/80 (BP Location: Right Arm, Patient Position: Sitting, Cuff Size: Normal)   Pulse 80   Temp 98.1 F (36.7 C) (Oral)   Resp 16   Wt 185 lb (83.9 kg)   BMI 31.76 kg/m   Physical Exam  Constitutional: She appears well-developed and well-nourished. No distress.  Cardiovascular: Normal rate, regular rhythm and normal heart sounds.  Exam reveals no gallop and no friction rub.   No murmur heard. Pulmonary/Chest: Effort normal and breath sounds normal. No respiratory distress. She has no wheezes. She has no rales.  Musculoskeletal: She exhibits no edema.  Skin: She is not diaphoretic.  Psychiatric: She has a normal mood and affect. Her behavior is normal. Judgment and thought content normal.  Vitals reviewed.     Assessment & Plan:  1. GAD (generalized anxiety disorder) Will continue wellbutrin 300mg  daily. Add xanax back for as needed use. She will call if symptoms worsen with discontinuing adderall to see if she wants to try increasing adderall if needed. We also discussed instead of increasing adderall may add wellbutrin 150mg  in the evening. She will call when/if needed.  - ALPRAZolam (XANAX) 0.25 MG tablet; Take 1 tablet (0.25 mg total) by mouth at bedtime as needed for anxiety.  Dispense: 60 tablet; Refill: [place], PA-C  [facility]

## 2016-03-11 NOTE — Patient Instructions (Signed)
Generalized Anxiety Disorder Generalized anxiety disorder (GAD) is a mental disorder. It interferes with life functions, including relationships, work, and school. GAD is different from normal anxiety, which everyone experiences at some point in their lives in response to specific life events and activities. Normal anxiety actually helps us prepare for and get through these life events and activities. Normal anxiety goes away after the event or activity is over.  GAD causes anxiety that is not necessarily related to specific events or activities. It also causes excess anxiety in proportion to specific events or activities. The anxiety associated with GAD is also difficult to control. GAD can vary from mild to severe. People with severe GAD can have intense waves of anxiety with physical symptoms (panic attacks).  SYMPTOMS The anxiety and worry associated with GAD are difficult to control. This anxiety and worry are related to many life events and activities and also occur more days than not for 6 months or longer. People with GAD also have three or more of the following symptoms (one or more in children):  Restlessness.   Fatigue.  Difficulty concentrating.   Irritability.  Muscle tension.  Difficulty sleeping or unsatisfying sleep. DIAGNOSIS GAD is diagnosed through an assessment by your health care provider. Your health care provider will ask you questions aboutyour mood,physical symptoms, and events in your life. Your health care provider may ask you about your medical history and use of alcohol or drugs, including prescription medicines. Your health care provider may also do a physical exam and blood tests. Certain medical conditions and the use of certain substances can cause symptoms similar to those associated with GAD. Your health care provider may refer you to a mental health specialist for further evaluation. TREATMENT The following therapies are usually used to treat GAD:    Medication. Antidepressant medication usually is prescribed for long-term daily control. Antianxiety medicines may be added in severe cases, especially when panic attacks occur.   Talk therapy (psychotherapy). Certain types of talk therapy can be helpful in treating GAD by providing support, education, and guidance. A form of talk therapy called cognitive behavioral therapy can teach you healthy ways to think about and react to daily life events and activities.  Stress managementtechniques. These include yoga, meditation, and exercise and can be very helpful when they are practiced regularly. A mental health specialist can help determine which treatment is best for you. Some people see improvement with one therapy. However, other people require a combination of therapies.   This information is not intended to replace advice given to you by your health care provider. Make sure you discuss any questions you have with your health care provider.   Document Released: 10/01/2012 Document Revised: 06/27/2014 Document Reviewed: 10/01/2012 Elsevier Interactive Patient Education 2016 Elsevier Inc[employer]  

## 2016-05-23 ENCOUNTER — Telehealth: Payer: Self-pay | Admitting: Physician Assistant

## 2016-05-23 NOTE — Telephone Encounter (Signed)
Patient has stopped all medications at this time. She has gained 15 pounds because she said the only thing that makes her feel anything is food. She has had a hard time with her oldest daughter and with her house. She was seen by the Doctor at [facility] (Dr. Corinda Gubler) and he started her on Prozac 10mg . She threw it away. She is going to restart this medication with starting 1/2 tab x 1 week then increasing to whole tab. She will call back in the morning to schedule a f/u with me.

## 2016-05-23 NOTE — Telephone Encounter (Signed)
Pt mom Freda Munro request a call back to discuss pt is having a lot of problems with depression right now. CB#[phone]/MW

## 2016-06-14 ENCOUNTER — Encounter: Payer: Self-pay | Admitting: Physician Assistant

## 2016-06-14 ENCOUNTER — Ambulatory Visit (INDEPENDENT_AMBULATORY_CARE_PROVIDER_SITE_OTHER): Payer: BLUE CROSS/BLUE SHIELD | Admitting: Physician Assistant

## 2016-06-14 VITALS — BP 96/60 | HR 76 | Temp 98.0°F | Resp 16 | Wt 188.0 lb

## 2016-06-14 DIAGNOSIS — F3341 Major depressive disorder, recurrent, in partial remission: Secondary | ICD-10-CM

## 2016-06-14 DIAGNOSIS — R0681 Apnea, not elsewhere classified: Secondary | ICD-10-CM

## 2016-06-14 DIAGNOSIS — R5383 Other fatigue: Secondary | ICD-10-CM | POA: Diagnosis not present

## 2016-06-14 DIAGNOSIS — R400 Somnolence: Secondary | ICD-10-CM | POA: Diagnosis not present

## 2016-06-14 MED ORDER — FLUOXETINE HCL 10 MG PO CAPS: 10.0000 mg | ORAL_CAPSULE | Freq: Every day | ORAL | 1 refills | Status: DC

## 2016-06-14 NOTE — Progress Notes (Signed)
Patient: Wendy [name] Female    DOB: 1975-09-24   40 y.o.   MRN: [id number] Visit Date: 06/14/2016  Today's Provider: Mar Daring, PA-C   Chief Complaint  Patient presents with  . Depression  . Anxiety   Subjective:    Depression         This is a chronic problem.  The problem has been gradually improving (Pt reports her dpression is 50% improved, after starting Prozac about 3 weeks ago which was prescribed by Dr. Corinda Gubler) since onset.  Associated symptoms include decreased concentration, fatigue, insomnia (improved on Xanax and Melatonin), irritable, restlessness, decreased interest, appetite change, headaches and sad.  Associated symptoms include no helplessness, no hopelessness, no body aches, no myalgias, no indigestion and no suicidal ideas.  Compliance with treatment is good.  Previous treatment provided moderate relief.  Past medical history includes anxiety.   Anxiety  Presents for follow-up visit. Symptoms include decreased concentration, depressed mood, insomnia (improved on Xanax and Melatonin), irritability, nervous/anxious behavior, palpitations, panic (in middle of night; pt reports she this is not bothering her), restlessness and shortness of breath. Patient reports no chest pain, compulsions, confusion, dizziness, dry mouth, excessive worry, feeling of choking, hyperventilation, muscle tension, nausea, obsessions or suicidal ideas. The patient sleeps 6 hours per night. The quality of sleep is good.    Pt reports she is taking Alprazolam at bedtime most nights, which helps improve sleep. She does report history of waking with panic attacks where she would awake gasping for air and her heart racing. Since starting the alprazolam at night she thought she was doing better. She does not recall having any awakenings as this, however, her daughter has now told her that she is still having moments she is gasping for air without awakening now. She also reports waking not  feeling rested and daytime drowsiness. She denies ever being told she snores.     Allergies  Allergen Reactions  . Peanuts [Peanut Oil] Shortness Of Breath  . Banana Itching  . Other Other (See Comments)    Walnuts- makes tongue blister  . Codeine Nausea And Vomiting  . Eggs Or Egg-Derived Products Nausea And Vomiting  . Vicodin [Hydrocodone-Acetaminophen] Nausea And Vomiting     Current Outpatient Prescriptions:  .  ALPRAZolam (XANAX) 0.25 MG tablet, Take 1 tablet (0.25 mg total) by mouth at bedtime as needed for anxiety., Disp: 60 tablet, Rfl: 5 .  FLUoxetine (PROZAC) 10 MG capsule, , Disp: , Rfl: 0 .  Melatonin 5 MG CHEW, Chew by mouth., Disp: , Rfl:  .  Multiple Vitamin (MULTI VITAMIN DAILY PO), Take by mouth., Disp: , Rfl:   Review of Systems  Constitutional: Positive for appetite change, fatigue and irritability. Negative for fever.  Respiratory: Positive for shortness of breath. Negative for cough, chest tightness and wheezing.   Cardiovascular: Positive for palpitations. Negative for chest pain.  Gastrointestinal: Negative for abdominal pain and nausea.  Musculoskeletal: Negative for myalgias.  Neurological: Positive for headaches. Negative for dizziness.  Psychiatric/Behavioral: Positive for decreased concentration and depression. Negative for confusion and suicidal ideas. The patient is nervous/anxious and has insomnia (improved on Xanax and Melatonin).     Social History  Substance Use Topics  . Smoking status: Never Smoker  . Smokeless tobacco: Never Used  . Alcohol use Yes     Comment: occasional/rare   Objective:   BP 96/60 (BP Location: Left Arm, Patient Position: Sitting, Cuff Size: Large)   Pulse 76  Temp 98 F (36.7 C) (Oral)   Resp 16   Wt 188 lb (85.3 kg)   LMP 05/24/2016   BMI 32.27 kg/m   Physical Exam  Constitutional: She appears well-developed and well-nourished. She is irritable. No distress.  Neck: Normal range of motion. Neck supple. No  JVD present. No tracheal deviation present. No thyromegaly present.  Cardiovascular: Normal rate, regular rhythm and normal heart sounds.  Exam reveals no gallop and no friction rub.   No murmur heard. Pulmonary/Chest: Effort normal and breath sounds normal. No respiratory distress. She has no wheezes. She has no rales.  Lymphadenopathy:    She has no cervical adenopathy.  Skin: She is not diaphoretic.  Psychiatric: Her speech is normal and behavior is normal. Judgment and thought content normal. Her mood appears anxious. Cognition and memory are normal. She exhibits a depressed mood.  Vitals reviewed.      Assessment & Plan:     1. Recurrent major depressive disorder, in partial remission ([place]) Improving. Continue Prozac as below and call if symptoms worsen. - FLUoxetine (PROZAC) 10 MG capsule; Take 1 capsule (10 mg total) by mouth daily.  Dispense: 90 capsule; Refill: 1  2. Witnessed episode of apnea Will refer to sleep center as below for consideration of sleep study. Will f/u pending results. - Ambulatory referral to Sleep Studies  3. Fatigue, unspecified type See above medical treatment plan. - Ambulatory referral to Sleep Studies  4. Daytime somnolence See above medical treatment plan. - Ambulatory referral to Sleep Studies     Patient seen and examined by Mar Daring, PA-C, and note scribed by Renaldo Fiddler, CMA.  Mar Daring, PA-C  [facility]

## 2016-08-06 ENCOUNTER — Encounter: Payer: Self-pay | Admitting: Physician Assistant

## 2016-08-06 DIAGNOSIS — F3341 Major depressive disorder, recurrent, in partial remission: Secondary | ICD-10-CM

## 2016-08-08 MED ORDER — FLUOXETINE HCL 20 MG PO TABS: 20.0000 mg | ORAL_TABLET | Freq: Every day | ORAL | 3 refills | Status: DC

## 2016-11-10 ENCOUNTER — Encounter: Payer: BLUE CROSS/BLUE SHIELD | Admitting: Physician Assistant

## 2016-11-17 ENCOUNTER — Encounter: Payer: BLUE CROSS/BLUE SHIELD | Admitting: Physician Assistant

## 2016-11-17 NOTE — Progress Notes (Deleted)
Patient: Wendy [name], Female    DOB: [birth year], 41 y.o.   MRN: [id number] Visit Date: 11/17/2016  Today's Provider: Mar Daring, PA-C   No chief complaint on file.  Subjective:    Annual physical exam Wendy [name] is a 41 y.o. female who presents today for health maintenance and complete physical. She feels {DESC; WELL/FAIRLY WELL/POORLY:18703}. She reports exercising ***. She reports she is sleeping {DESC; WELL/FAIRLY WELL/POORLY:18703}.  Last CPE- 11/05/2015 Last pap- 11/05/2015- abnormal with atypical squamous cells of undetermined significance. HPV/GC/chlamydia all negative. Repeat 1 year. Last mammogram-09/09/2014- BI-RADS 1 -----------------------------------------------------------------   Review of Systems  Social History      She  reports that she has never smoked. She has never used smokeless tobacco. She reports that she drinks alcohol. She reports that she does not use drugs.       Social History   Social History  . Marital status: Married    Spouse name: N/A  . Number of children: N/A  . Years of education: N/A   Social History Main Topics  . Smoking status: Never Smoker  . Smokeless tobacco: Never Used  . Alcohol use Yes     Comment: occasional/rare  . Drug use: No  . Sexual activity: Yes    Partners: Male    Birth control/ protection: Surgical     Comment: Tubaligation   Other Topics Concern  . Not on file   Social History Narrative  . No narrative on file    Past Medical History:  Diagnosis Date  . Abnormal Pap smear of cervix 10/2011  . Allergy   . Anxiety   . Depression   . Heart murmur    slight     Patient Active Problem List   Diagnosis Date Noted  . GAD (generalized anxiety disorder) 11/05/2015  . ADHD (attention deficit hyperactivity disorder) 11/05/2015  . Obese 11/05/2015  . Urinary incontinence in female 11/05/2015  . Constipation 11/05/2015  . Menstrual migraine 11/05/2015  . Contraception management  05/02/2012  . Depression 02/13/2012    Past Surgical History:  Procedure Laterality Date  . CESAREAN SECTION  2004   x1  . LAPAROSCOPIC TUBAL LIGATION  05/02/2012   Procedure: LAPAROSCOPIC TUBAL LIGATION;  Surgeon: Lanice Folden Filbert, MD;  Location: [facility];  Service: Gynecology;  Laterality: Bilateral;  with filshie clips  . TUBAL LIGATION  04/2012  . WISDOM TOOTH EXTRACTION     x4  . WRIST SURGERY     rigth cyst removal.    Family History        Family Status  Relation Status  . Mother Alive  . Father Alive  . MGM Alive  . MGF Deceased  . PGM Alive  . PGF Deceased        Her family history includes Alcohol abuse in her father; Arthritis in her mother; Asthma in her paternal grandmother; Emphysema in her maternal grandfather; Heart disease in her father, maternal grandfather, maternal grandmother, and paternal grandfather; Hypothyroidism in her mother.     Allergies  Allergen Reactions  . Peanuts [Peanut Oil] Shortness Of Breath  . Banana Itching  . Other Other (See Comments)    Walnuts- makes tongue blister  . Codeine Nausea And Vomiting  . Eggs Or Egg-Derived Products Nausea And Vomiting  . Vicodin [Hydrocodone-Acetaminophen] Nausea And Vomiting     Current Outpatient Prescriptions:  .  ALPRAZolam (XANAX) 0.25 MG tablet, Take 1 tablet (0.25 mg total) by mouth at bedtime as  needed for anxiety., Disp: 60 tablet, Rfl: 5 .  FLUoxetine (PROZAC) 20 MG tablet, Take 1 tablet (20 mg total) by mouth daily., Disp: 30 tablet, Rfl: 3 .  Melatonin 5 MG CHEW, Chew by mouth., Disp: , Rfl:  .  Multiple Vitamin (MULTI VITAMIN DAILY PO), Take by mouth., Disp: , Rfl:    Patient Care Team: Rubye Beach as PCP - General (Family Medicine)      Objective:   Vitals: There were no vitals taken for this visit.  There were no vitals filed for this visit.   Physical Exam   Depression Screen No flowsheet data found.    Assessment & Plan:     Routine Health Maintenance  and Physical Exam  Exercise Activities and Dietary recommendations Goals    None       There is no immunization history on file for this patient.  Health Maintenance  Topic Date Due  . TETANUS/TDAP  02/16/1995  . PAP SMEAR  11/04/2016  . INFLUENZA VACCINE  01/18/2017  . HIV Screening  Completed     Discussed health benefits of physical activity, and encouraged her to engage in regular exercise appropriate for her age and condition.    --------------------------------------------------------------------    Mar Daring, PA-C  [place]

## 2016-11-18 ENCOUNTER — Ambulatory Visit (INDEPENDENT_AMBULATORY_CARE_PROVIDER_SITE_OTHER): Payer: BLUE CROSS/BLUE SHIELD | Admitting: Physician Assistant

## 2016-11-18 ENCOUNTER — Encounter: Payer: Self-pay | Admitting: Physician Assistant

## 2016-11-18 VITALS — BP 110/60 | HR 92 | Temp 98.1°F | Resp 16 | Ht 64.0 in | Wt 187.4 lb

## 2016-11-18 DIAGNOSIS — F32A Depression, unspecified: Secondary | ICD-10-CM

## 2016-11-18 DIAGNOSIS — Z124 Encounter for screening for malignant neoplasm of cervix: Secondary | ICD-10-CM | POA: Diagnosis not present

## 2016-11-18 DIAGNOSIS — Z0000 Encounter for general adult medical examination without abnormal findings: Secondary | ICD-10-CM

## 2016-11-18 DIAGNOSIS — Z23 Encounter for immunization: Secondary | ICD-10-CM

## 2016-11-18 DIAGNOSIS — Z1231 Encounter for screening mammogram for malignant neoplasm of breast: Secondary | ICD-10-CM

## 2016-11-18 DIAGNOSIS — Z1239 Encounter for other screening for malignant neoplasm of breast: Secondary | ICD-10-CM

## 2016-11-18 DIAGNOSIS — F329 Major depressive disorder, single episode, unspecified: Secondary | ICD-10-CM | POA: Diagnosis not present

## 2016-11-18 NOTE — Progress Notes (Signed)
Patient: Wendy [name], Female    DOB: [birth year], 41 y.o.   MRN: [id number] Visit Date: 11/18/2016  Today's Provider: Mar Daring, PA-C   Chief Complaint  Patient presents with  . Annual Exam   Subjective:    Annual physical exam Wendy [name] is a 41 y.o. female who presents today for health maintenance and complete physical. She feels well. She reports exercising 3-4 days a week. She reports she is sleeping well.  05/18/117 CPE 11/05/15 Pap-pos, HPV-neg 09/09/14 Mammogram-BI-RADS 1 -----------------------------------------------------------------   Review of Systems  Constitutional: Negative.   HENT: Positive for dental problem.   Eyes: Negative.   Respiratory: Negative.   Cardiovascular: Positive for palpitations.  Gastrointestinal: Negative.   Endocrine: Negative.   Genitourinary: Positive for frequency.  Musculoskeletal: Positive for arthralgias.  Skin: Negative.   Allergic/Immunologic: Positive for environmental allergies and food allergies.  Neurological: Positive for headaches.  Hematological: Negative.   Psychiatric/Behavioral: Negative.     Social History      She  reports that she has never smoked. She has never used smokeless tobacco. She reports that she drinks alcohol. She reports that she does not use drugs.       Social History   Social History  . Marital status: Married    Spouse name: N/A  . Number of children: N/A  . Years of education: N/A   Social History Main Topics  . Smoking status: Never Smoker  . Smokeless tobacco: Never Used  . Alcohol use Yes     Comment: occasional/rare  . Drug use: No  . Sexual activity: Yes    Partners: Male    Birth control/ protection: Surgical     Comment: Tubaligation   Other Topics Concern  . None   Social History Narrative  . None    Past Medical History:  Diagnosis Date  . Abnormal Pap smear of cervix 10/2011  . Allergy   . Anxiety   . Depression   . Heart murmur    slight      Patient Active Problem List   Diagnosis Date Noted  . GAD (generalized anxiety disorder) 11/05/2015  . ADHD (attention deficit hyperactivity disorder) 11/05/2015  . Obese 11/05/2015  . Urinary incontinence in female 11/05/2015  . Constipation 11/05/2015  . Menstrual migraine 11/05/2015  . Contraception management 05/02/2012  . Depression 02/13/2012    Past Surgical History:  Procedure Laterality Date  . CESAREAN SECTION  2004   x1  . LAPAROSCOPIC TUBAL LIGATION  05/02/2012   Procedure: LAPAROSCOPIC TUBAL LIGATION;  Surgeon: Emily Filbert, MD;  Location: [facility];  Service: Gynecology;  Laterality: Bilateral;  with filshie clips  . TUBAL LIGATION  04/2012  . WISDOM TOOTH EXTRACTION     x4  . WRIST SURGERY     rigth cyst removal.    Family History        Family Status  Relation Status  . Mother Alive  . Father Alive  . MGM Alive  . MGF Deceased  . PGM Alive  . PGF Deceased  . Daughter Alive  . Daughter Alive        Her family history includes Alcohol abuse in her father; Arthritis in her mother; Asthma in her paternal grandmother; Emphysema in her maternal grandfather; Heart disease in her father, maternal grandfather, maternal grandmother, and paternal grandfather; Hypothyroidism in her mother.     Allergies  Allergen Reactions  . Peanuts [Peanut Oil] Shortness Of Breath  . Banana  Itching  . Other Other (See Comments)    Walnuts- makes tongue blister  . Codeine Nausea And Vomiting  . Eggs Or Egg-Derived Products Nausea And Vomiting  . Vicodin [Hydrocodone-Acetaminophen] Nausea And Vomiting     Current Outpatient Prescriptions:  .  FLUoxetine (PROZAC) 20 MG tablet, Take 1 tablet (20 mg total) by mouth daily., Disp: 30 tablet, Rfl: 3 .  Melatonin 5 MG CHEW, Chew by mouth., Disp: , Rfl:  .  Multiple Vitamin (MULTI VITAMIN DAILY PO), Take by mouth., Disp: , Rfl:  .  ALPRAZolam (XANAX) 0.25 MG tablet, Take 1 tablet (0.25 mg total) by mouth at bedtime as needed  for anxiety. (Patient not taking: Reported on 11/18/2016), Disp: 60 tablet, Rfl: 5   Patient Care Team: Mar Daring, PA-C as PCP - General (Family Medicine)      Objective:   Vitals: BP 110/60 (BP Location: Left Arm, Patient Position: Sitting, Cuff Size: Large)   Pulse 92   Temp 98.1 F (36.7 C) (Oral)   Resp 16   Ht 5\' 4"  (1.626 m)   Wt 187 lb 6.4 oz (85 kg)   SpO2 99%   BMI 32.17 kg/m    Vitals:   11/18/16 1410  BP: 110/60  Pulse: 92  Resp: 16  Temp: 98.1 F (36.7 C)  TempSrc: Oral  SpO2: 99%  Weight: 187 lb 6.4 oz (85 kg)  Height: 5\' 4"  (1.626 m)     Physical Exam  Constitutional: She is oriented to person, place, and time. She appears well-developed and well-nourished. No distress.  HENT:  Head: Normocephalic and atraumatic.  Right Ear: Hearing, tympanic membrane, external ear and ear canal normal.  Left Ear: Hearing, tympanic membrane, external ear and ear canal normal.  Nose: Nose normal.  Mouth/Throat: Uvula is midline, oropharynx is clear and moist and mucous membranes are normal. No oropharyngeal exudate.  Eyes: Conjunctivae and EOM are normal. Pupils are equal, round, and reactive to light. Right eye exhibits no discharge. Left eye exhibits no discharge. No scleral icterus.  Neck: Normal range of motion. Neck supple. No JVD present. Carotid bruit is not present. No tracheal deviation present. No thyromegaly present.  Cardiovascular: Normal rate, regular rhythm, normal heart sounds and intact distal pulses.  Exam reveals no gallop and no friction rub.   No murmur heard. Pulmonary/Chest: Effort normal and breath sounds normal. No respiratory distress. She has no wheezes. She has no rales. She exhibits no tenderness. Right breast exhibits no inverted nipple, no mass, no nipple discharge, no skin change and no tenderness. Left breast exhibits no inverted nipple, no mass, no nipple discharge, no skin change and no tenderness. Breasts are symmetrical.   Abdominal: Soft. Bowel sounds are normal. She exhibits no distension and no mass. There is no tenderness. There is no rebound and no guarding. Hernia confirmed negative in the right inguinal area and confirmed negative in the left inguinal area.  Genitourinary: Rectum normal, vagina normal and uterus normal. No breast swelling, tenderness, discharge or bleeding. Pelvic exam was performed with patient supine. There is no rash, tenderness, lesion or injury on the right labia. There is no rash, tenderness, lesion or injury on the left labia. Cervix exhibits no motion tenderness, no discharge and no friability. Right adnexum displays no mass, no tenderness and no fullness. Left adnexum displays no mass, no tenderness and no fullness. No erythema, tenderness or bleeding in the vagina. No signs of injury around the vagina. No vaginal discharge found.  Genitourinary Comments:  Small amount of brownish-red discharge consistent with patient just completing menstrual  Musculoskeletal: Normal range of motion. She exhibits no edema or tenderness.  Lymphadenopathy:    She has no cervical adenopathy.       Right: No inguinal adenopathy present.       Left: No inguinal adenopathy present.  Neurological: She is alert and oriented to person, place, and time. She has normal reflexes. No cranial nerve deficit. Coordination normal.  Skin: Skin is warm and dry. No rash noted. She is not diaphoretic.  Psychiatric: She has a normal mood and affect. Her behavior is normal. Judgment and thought content normal.  Vitals reviewed.    Depression Screen PHQ 2/9 Scores 11/18/2016  PHQ - 2 Score 0  PHQ- 9 Score 0      Assessment & Plan:     Routine Health Maintenance and Physical Exam  Exercise Activities and Dietary recommendations Goals    None       There is no immunization history on file for this patient.  Health Maintenance  Topic Date Due  . TETANUS/TDAP  02/16/1995  . PAP SMEAR  11/04/2016  .  INFLUENZA VACCINE  01/18/2017  . HIV Screening  Completed     Discussed health benefits of physical activity, and encouraged her to engage in regular exercise appropriate for her age and condition.   1. Annual physical exam Normal physical exam today. Will check labs as below and f/u pending lab results. If labs are stable and WNL she will not need to have these rechecked for one year at her next annual physical exam. She is to call the office in the meantime if she has any acute issue, questions or concerns. - CBC with Differential/Platelet - Comprehensive metabolic panel - TSH - Lipid Panel With LDL/HDL Ratio  2. Breast cancer screening Breast exam today was normal. There is no family history of breast cancer. She does perform regular self breast exams. Mammogram was ordered as below. Information for [facility] clinic was given to patient so she may schedule her mammogram at her convenience. - MM DIGITAL SCREENING BILATERAL; Future  3. Cervical cancer screening Pap last year was ASCUS, HPV negative. Repeated today. Will follow up pending results.  - Pap IG w/ reflex to HPV when ASC-U  4. Need for Tdap vaccination Tdap Vaccine given to patient without complications. Patient sat for 15 minutes after administration and was tolerated well without adverse effects. - Tdap vaccine greater than or equal to 7yo IM  5. Depression, unspecified depression type Stable. Continue Fluoxetine 20mg  daily.  - TSH  --------------------------------------------------------------------    Mar Daring, PA-C  [facility]

## 2016-11-18 NOTE — Patient Instructions (Addendum)
Please call the [place] at [facility] to schedule this at [phone].   Health Maintenance, Female Adopting a healthy lifestyle and getting preventive care can go a long way to promote health and wellness. Talk with your health care provider about what schedule of regular examinations is right for you. This is a good chance for you to check in with your provider about disease prevention and staying healthy. In between checkups, there are plenty of things you can do on your own. Experts have done a lot of research about which lifestyle changes and preventive measures are most likely to keep you healthy. Ask your health care provider for more information. Weight and diet Eat a healthy diet  Be sure to include plenty of vegetables, fruits, low-fat dairy products, and lean protein.  Do not eat a lot of foods high in solid fats, added sugars, or salt.  Get regular exercise. This is one of the most important things you can do for your health. ? Most adults should exercise for at least 150 minutes each week. The exercise should increase your heart rate and make you sweat (moderate-intensity exercise). ? Most adults should also do strengthening exercises at least twice a week. This is in addition to the moderate-intensity exercise.  Maintain a healthy weight  Body mass index (BMI) is a measurement that can be used to identify possible weight problems. It estimates body fat based on height and weight. Your health care provider can help determine your BMI and help you achieve or maintain a healthy weight.  For females 40 years of age and older: ? A BMI below 18.5 is considered underweight. ? A BMI of 18.5 to 24.9 is normal. ? A BMI of 25 to 29.9 is considered overweight. ? A BMI of 30 and above is considered obese.  Watch levels of cholesterol and blood lipids  You should start having your blood tested for lipids and cholesterol at 41 years of age, then have  this test every 5 years.  You may need to have your cholesterol levels checked more often if: ? Your lipid or cholesterol levels are high. ? You are older than 41 years of age. ? You are at high risk for heart disease.  Cancer screening Lung Cancer  Lung cancer screening is recommended for adults 35-69 years old who are at high risk for lung cancer because of a history of smoking.  A yearly low-dose CT scan of the lungs is recommended for people who: ? Currently smoke. ? Have quit within the past 15 years. ? Have at least a 30-pack-year history of smoking. A pack year is smoking an average of one pack of cigarettes a day for 1 year.  Yearly screening should continue until it has been 15 years since you quit.  Yearly screening should stop if you develop a health problem that would prevent you from having lung cancer treatment.  Breast Cancer  Practice breast self-awareness. This means understanding how your breasts normally appear and feel.  It also means doing regular breast self-exams. Let your health care provider know about any changes, no matter how small.  If you are in your 20s or 30s, you should have a clinical breast exam (CBE) by a health care provider every 1-3 years as part of a regular health exam.  If you are 26 or older, have a CBE every year. Also consider having a breast X-ray (mammogram) every year.  If you have a family history  of breast cancer, talk to your health care provider about genetic screening.  If you are at high risk for breast cancer, talk to your health care provider about having an MRI and a mammogram every year.  Breast cancer gene (BRCA) assessment is recommended for women who have family members with BRCA-related cancers. BRCA-related cancers include: ? Breast. ? Ovarian. ? Tubal. ? Peritoneal cancers.  Results of the assessment will determine the need for genetic counseling and BRCA1 and BRCA2 testing.  Cervical Cancer Your health care  provider may recommend that you be screened regularly for cancer of the pelvic organs (ovaries, uterus, and vagina). This screening involves a pelvic examination, including checking for microscopic changes to the surface of your cervix (Pap test). You may be encouraged to have this screening done every 3 years, beginning at age 20.  For women ages 58-65, health care providers may recommend pelvic exams and Pap testing every 3 years, or they may recommend the Pap and pelvic exam, combined with testing for human papilloma virus (HPV), every 5 years. Some types of HPV increase your risk of cervical cancer. Testing for HPV may also be done on women of any age with unclear Pap test results.  Other health care providers may not recommend any screening for nonpregnant women who are considered low risk for pelvic cancer and who do not have symptoms. Ask your health care provider if a screening pelvic exam is right for you.  If you have had past treatment for cervical cancer or a condition that could lead to cancer, you need Pap tests and screening for cancer for at least 20 years after your treatment. If Pap tests have been discontinued, your risk factors (such as having a new sexual partner) need to be reassessed to determine if screening should resume. Some women have medical problems that increase the chance of getting cervical cancer. In these cases, your health care provider may recommend more frequent screening and Pap tests.  Colorectal Cancer  This type of cancer can be detected and often prevented.  Routine colorectal cancer screening usually begins at 41 years of age and continues through 41 years of age.  Your health care provider may recommend screening at an earlier age if you have risk factors for colon cancer.  Your health care provider may also recommend using home test kits to check for hidden blood in the stool.  A small camera at the end of a tube can be used to examine your colon  directly (sigmoidoscopy or colonoscopy). This is done to check for the earliest forms of colorectal cancer.  Routine screening usually begins at age 27.  Direct examination of the colon should be repeated every 5-10 years through 41 years of age. However, you may need to be screened more often if early forms of precancerous polyps or small growths are found.  Skin Cancer  Check your skin from head to toe regularly.  Tell your health care provider about any new moles or changes in moles, especially if there is a change in a mole's shape or color.  Also tell your health care provider if you have a mole that is larger than the size of a pencil eraser.  Always use sunscreen. Apply sunscreen liberally and repeatedly throughout the day.  Protect yourself by wearing long sleeves, pants, a wide-brimmed hat, and sunglasses whenever you are outside.  Heart disease, diabetes, and high blood pressure  High blood pressure causes heart disease and increases the risk of stroke. High  blood pressure is more likely to develop in: ? People who have blood pressure in the high end of the normal range (130-139/85-89 mm Hg). ? People who are overweight or obese. ? People who are African American.  If you are 90-2 years of age, have your blood pressure checked every 3-5 years. If you are 53 years of age or older, have your blood pressure checked every year. You should have your blood pressure measured twice-once when you are at a hospital or clinic, and once when you are not at a hospital or clinic. Record the average of the two measurements. To check your blood pressure when you are not at a hospital or clinic, you can use: ? An automated blood pressure machine at a pharmacy. ? A home blood pressure monitor.  If you are between 37 years and 1 years old, ask your health care provider if you should take aspirin to prevent strokes.  Have regular diabetes screenings. This involves taking a blood sample to  check your fasting blood sugar level. ? If you are at a normal weight and have a low risk for diabetes, have this test once every three years after 41 years of age. ? If you are overweight and have a high risk for diabetes, consider being tested at a younger age or more often. Preventing infection Hepatitis B  If you have a higher risk for hepatitis B, you should be screened for this virus. You are considered at high risk for hepatitis B if: ? You were born in a country where hepatitis B is common. Ask your health care provider which countries are considered high risk. ? Your parents were born in a high-risk country, and you have not been immunized against hepatitis B (hepatitis B vaccine). ? You have HIV or AIDS. ? You use needles to inject street drugs. ? You live with someone who has hepatitis B. ? You have had sex with someone who has hepatitis B. ? You get hemodialysis treatment. ? You take certain medicines for conditions, including cancer, organ transplantation, and autoimmune conditions.  Hepatitis C  Blood testing is recommended for: ? Everyone born from [birth year] through 1965. ? Anyone with known risk factors for hepatitis C.  Sexually transmitted infections (STIs)  You should be screened for sexually transmitted infections (STIs) including gonorrhea and chlamydia if: ? You are sexually active and are younger than 41 years of age. ? You are older than 41 years of age and your health care provider tells you that you are at risk for this type of infection. ? Your sexual activity has changed since you were last screened and you are at an increased risk for chlamydia or gonorrhea. Ask your health care provider if you are at risk.  If you do not have HIV, but are at risk, it may be recommended that you take a prescription medicine daily to prevent HIV infection. This is called pre-exposure prophylaxis (PrEP). You are considered at risk if: ? You are sexually active and do not regularly  use condoms or know the HIV status of your partner(s). ? You take drugs by injection. ? You are sexually active with a partner who has HIV.  Talk with your health care provider about whether you are at high risk of being infected with HIV. If you choose to begin PrEP, you should first be tested for HIV. You should then be tested every 3 months for as long as you are taking PrEP. Pregnancy  If you are  premenopausal and you may become pregnant, ask your health care provider about preconception counseling.  If you may become pregnant, take 400 to 800 micrograms (mcg) of folic acid every day.  If you want to prevent pregnancy, talk to your health care provider about birth control (contraception). Osteoporosis and menopause  Osteoporosis is a disease in which the bones lose minerals and strength with aging. This can result in serious bone fractures. Your risk for osteoporosis can be identified using a bone density scan.  If you are 34 years of age or older, or if you are at risk for osteoporosis and fractures, ask your health care provider if you should be screened.  Ask your health care provider whether you should take a calcium or vitamin D supplement to lower your risk for osteoporosis.  Menopause may have certain physical symptoms and risks.  Hormone replacement therapy may reduce some of these symptoms and risks. Talk to your health care provider about whether hormone replacement therapy is right for you. Follow these instructions at home:  Schedule regular health, dental, and eye exams.  Stay current with your immunizations.  Do not use any tobacco products including cigarettes, chewing tobacco, or electronic cigarettes.  If you are pregnant, do not drink alcohol.  If you are breastfeeding, limit how much and how often you drink alcohol.  Limit alcohol intake to no more than 1 drink per day for nonpregnant women. One drink equals 12 ounces of beer, 5 ounces of wine, or 1 ounces  of hard liquor.  Do not use street drugs.  Do not share needles.  Ask your health care provider for help if you need support or information about quitting drugs.  Tell your health care provider if you often feel depressed.  Tell your health care provider if you have ever been abused or do not feel safe at home. This information is not intended to replace advice given to you by your health care provider. Make sure you discuss any questions you have with your health care provider. Document Released: 12/20/2010 Document Revised: 11/12/2015 Document Reviewed: 03/10/2015 Elsevier Interactive Patient Education  [employer].

## 2016-11-21 ENCOUNTER — Telehealth: Payer: Self-pay

## 2016-11-21 LAB — PAP IG W/ RFLX HPV ASCU: PAP SMEAR COMMENT: 0

## 2016-11-21 NOTE — Telephone Encounter (Signed)
-----   Message from Mar Daring, PA-C sent at 11/21/2016  4:51 PM EDT ----- Pap was normal this year. Will repeat yearly for next two years to get 3 consecutive normals.

## 2016-11-21 NOTE — Telephone Encounter (Signed)
LMTCB or to view result on mychart. If any questions or concerns to call the clinic  Thanks,  -Joseline

## 2016-11-23 ENCOUNTER — Telehealth: Payer: Self-pay

## 2016-11-23 LAB — COMPREHENSIVE METABOLIC PANEL
A/G RATIO: 2.3 — AB (ref 1.2–2.2)
ALBUMIN: 4.5 g/dL (ref 3.5–5.5)
ALT: 10 IU/L (ref 0–32)
AST: 13 IU/L (ref 0–40)
Alkaline Phosphatase: 57 IU/L (ref 39–117)
BUN / CREAT RATIO: 16 (ref 9–23)
BUN: 10 mg/dL (ref 6–24)
Bilirubin Total: 0.4 mg/dL (ref 0.0–1.2)
CALCIUM: 9.1 mg/dL (ref 8.7–10.2)
CO2: 23 mmol/L (ref 18–29)
CREATININE: 0.61 mg/dL (ref 0.57–1.00)
Chloride: 101 mmol/L (ref 96–106)
GFR calc non Af Amer: 114 mL/min/{1.73_m2} (ref >59)
GFR, EST AFRICAN AMERICAN: 131 mL/min/{1.73_m2} (ref >59)
GLOBULIN, TOTAL: 2 g/dL (ref 1.5–4.5)
Glucose: 84 mg/dL (ref 65–99)
POTASSIUM: 4.2 mmol/L (ref 3.5–5.2)
SODIUM: 137 mmol/L (ref 134–144)
TOTAL PROTEIN: 6.5 g/dL (ref 6.0–8.5)

## 2016-11-23 LAB — CBC WITH DIFFERENTIAL/PLATELET
BASOS: 0 %
Basophils Absolute: 0 10*3/uL (ref 0.0–0.2)
EOS (ABSOLUTE): 0.2 10*3/uL (ref 0.0–0.4)
EOS: 3 %
HEMATOCRIT: 40.1 % (ref 34.0–46.6)
Hemoglobin: 13.3 g/dL (ref 11.1–15.9)
Immature Grans (Abs): 0 10*3/uL (ref 0.0–0.1)
Immature Granulocytes: 0 %
LYMPHS ABS: 1.6 10*3/uL (ref 0.7–3.1)
Lymphs: 23 %
MCH: 30.2 pg (ref 26.6–33.0)
MCHC: 33.2 g/dL (ref 31.5–35.7)
MCV: 91 fL (ref 79–97)
Monocytes Absolute: 0.5 10*3/uL (ref 0.1–0.9)
Monocytes: 8 %
NEUTROS PCT: 66 %
Neutrophils Absolute: 4.4 10*3/uL (ref 1.4–7.0)
PLATELETS: 213 10*3/uL (ref 150–379)
RBC: 4.41 x10E6/uL (ref 3.77–5.28)
RDW: 13.6 % (ref 12.3–15.4)
WBC: 6.7 10*3/uL (ref 3.4–10.8)

## 2016-11-23 LAB — TSH: TSH: 1.61 u[IU]/mL (ref 0.450–4.500)

## 2016-11-23 LAB — LIPID PANEL WITH LDL/HDL RATIO
Cholesterol, Total: 170 mg/dL (ref 100–199)
HDL: 67 mg/dL (ref >39)
LDL Calculated: 96 mg/dL (ref 0–99)
LDL/HDL RATIO: 1.4 ratio (ref 0.0–3.2)
Triglycerides: 34 mg/dL (ref 0–149)
VLDL CHOLESTEROL CAL: 7 mg/dL (ref 5–40)

## 2016-11-23 NOTE — Telephone Encounter (Signed)
Patient advised as directed below. Per patient she saw her pap results yesterday in [place].  Thanks,  -Joseline

## 2016-11-23 NOTE — Telephone Encounter (Signed)
-----   Message from Mar Daring, PA-C sent at 11/23/2016  8:26 AM EDT ----- All labs are within normal limits and stable.  Thanks! -JB

## 2016-12-05 ENCOUNTER — Other Ambulatory Visit: Payer: Self-pay | Admitting: Physician Assistant

## 2016-12-05 DIAGNOSIS — F3341 Major depressive disorder, recurrent, in partial remission: Secondary | ICD-10-CM

## 2017-06-26 ENCOUNTER — Ambulatory Visit: Payer: BLUE CROSS/BLUE SHIELD | Admitting: Physician Assistant

## 2017-06-27 ENCOUNTER — Ambulatory Visit: Payer: BLUE CROSS/BLUE SHIELD | Admitting: Physician Assistant

## 2017-06-27 ENCOUNTER — Encounter: Payer: Self-pay | Admitting: Physician Assistant

## 2017-06-27 VITALS — BP 108/70 | HR 77 | Temp 98.4°F | Resp 16 | Wt 191.8 lb

## 2017-06-27 DIAGNOSIS — J0100 Acute maxillary sinusitis, unspecified: Secondary | ICD-10-CM

## 2017-06-27 DIAGNOSIS — H00015 Hordeolum externum left lower eyelid: Secondary | ICD-10-CM | POA: Diagnosis not present

## 2017-06-27 MED ORDER — AMOXICILLIN-POT CLAVULANATE 875-125 MG PO TABS: 1.0000 | ORAL_TABLET | Freq: Two times a day (BID) | ORAL | 0 refills | Status: DC

## 2017-06-27 NOTE — Progress Notes (Signed)
Patient: Wendy [name] Female    DOB: 03/19/1976   42 y.o.   MRN: [id number] Visit Date: 06/27/2017  Today's Provider: Mar Daring, PA-C   Chief Complaint  Patient presents with  . Stye   Subjective:    HPI Patient comes in today with c/o stye in her left eye. She reports it appeared since Thursday with some redness and swelling with discharge. Per patient her right eye was also hurting but feels it was from her migraine.. She reports putting warm compress on left eye. Now she reports she has sinus pressure and some headache with some nasal congestion. No cough, ear pain, fever, post nasal drip, SOB or wheezing.     Allergies  Allergen Reactions  . Peanuts [Peanut Oil] Shortness Of Breath  . Banana Itching  . Other Other (See Comments)    Walnuts- makes tongue blister  . Codeine Nausea And Vomiting  . Eggs Or Egg-Derived Products Nausea And Vomiting  . Vicodin [Hydrocodone-Acetaminophen] Nausea And Vomiting     Current Outpatient Medications:  Marland Kitchen  Melatonin 5 MG CHEW, Chew by mouth., Disp: , Rfl:  .  Multiple Vitamin (MULTI VITAMIN DAILY PO), Take by mouth., Disp: , Rfl:  .  FLUoxetine (PROZAC) 20 MG tablet, TAKE ONE TABLET EVERY DAY (Patient not taking: Reported on 06/27/2017), Disp: 30 tablet, Rfl: 5  Review of Systems  Constitutional: Negative for fever.  HENT: Positive for congestion, postnasal drip, sinus pressure and sinus pain. Negative for ear pain, rhinorrhea and sore throat.   Eyes: Negative for visual disturbance.  Respiratory: Negative for cough, chest tightness, shortness of breath and wheezing.   Cardiovascular: Negative for chest pain, palpitations and leg swelling.  Neurological: Positive for headaches.    Social History   Tobacco Use  . Smoking status: Never Smoker  . Smokeless tobacco: Never Used  Substance Use Topics  . Alcohol use: Yes    Comment: occasional/rare   Objective:   BP 108/70 (BP Location: Left Arm, Patient Position:  Sitting, Cuff Size: Normal)   Pulse 77   Temp 98.4 F (36.9 C) (Oral)   Resp 16   Wt 191 lb 12.8 oz (87 kg)   SpO2 99%   BMI 32.92 kg/m    Physical Exam  Constitutional: She appears well-developed and well-nourished. No distress.  HENT:  Head: Normocephalic and atraumatic.  Right Ear: Hearing, tympanic membrane, external ear and ear canal normal.  Left Ear: Hearing, tympanic membrane, external ear and ear canal normal.  Nose: Mucosal edema present. Right sinus exhibits maxillary sinus tenderness. Right sinus exhibits no frontal sinus tenderness. Left sinus exhibits maxillary sinus tenderness. Left sinus exhibits no frontal sinus tenderness.  Mouth/Throat: Uvula is midline, oropharynx is clear and moist and mucous membranes are normal. No oropharyngeal exudate, posterior oropharyngeal edema or posterior oropharyngeal erythema.  Eyes: Conjunctivae and EOM are normal. Pupils are equal, round, and reactive to light. Left eye exhibits hordeolum (lower left lid).  Neck: Normal range of motion. Neck supple. No tracheal deviation present. No thyromegaly present.  Cardiovascular: Normal rate, regular rhythm and normal heart sounds. Exam reveals no gallop and no friction rub.  No murmur heard. Pulmonary/Chest: Effort normal and breath sounds normal. No stridor. No respiratory distress. She has no wheezes. She has no rales.  Lymphadenopathy:    She has no cervical adenopathy.  Skin: She is not diaphoretic.  Vitals reviewed.       Assessment & Plan:     1.  Acute maxillary sinusitis, recurrence not specified Worsening symptoms that have not responded to OTC medications. Will give augmentin as below. Continue allergy medications. Stay well hydrated and get plenty of rest. Call if no symptom improvement or if symptoms worsen. - amoxicillin-clavulanate (AUGMENTIN) 875-125 MG tablet; Take 1 tablet by mouth 2 (two) times daily.  Dispense: 20 tablet; Refill: 0  2. Hordeolum externum of left lower  eyelid Continue conservative management with warm compresses, throw out old make up. No eye make up until stye resolves.        Mar Daring, PA-C  [facility]

## 2017-06-27 NOTE — Patient Instructions (Signed)
Stye A stye is a bump on your eyelid caused by a bacterial infection. A stye can form inside the eyelid (internal stye) or outside the eyelid (external stye). An internal stye may be caused by an infected oil-producing gland inside your eyelid. An external stye may be caused by an infection at the base of your eyelash (hair follicle). Styes are very common. Anyone can get them at any age. They usually occur in just one eye, but you may have more than one in either eye. What are the causes? The infection is almost always caused by bacteria called Staphylococcus aureus. This is a common type of bacteria that lives on your skin. What increases the risk? You may be at higher risk for a stye if you have had one before. You may also be at higher risk if you have:  Diabetes.  Long-term illness.  Long-term eye redness.  A skin condition called seborrhea.  High fat levels in your blood (lipids).  What are the signs or symptoms? Eyelid pain is the most common symptom of a stye. Internal styes are more painful than external styes. Other signs and symptoms may include:  Painful swelling of your eyelid.  A scratchy feeling in your eye.  Tearing and redness of your eye.  Pus draining from the stye.  How is this diagnosed? Your health care provider may be able to diagnose a stye just by examining your eye. The health care provider may also check to make sure:  You do not have a fever or other signs of a more serious infection.  The infection has not spread to other parts of your eye or areas around your eye.  How is this treated? Most styes will clear up in a few days without treatment. In some cases, you may need to use antibiotic drops or ointment to prevent infection. Your health care provider may have to drain the stye surgically if your stye is:  Large.  Causing a lot of pain.  Interfering with your vision.  This can be done using a thin blade or a needle. Follow these  instructions at home:  Take medicines only as directed by your health care provider.  Apply a clean, warm compress to your eye for 10 minutes, 4 times a day.  Do not wear contact lenses or eye makeup until your stye has healed.  Do not try to pop or drain the stye. Contact a health care provider if:  You have chills or a fever.  Your stye does not go away after several days.  Your stye affects your vision.  Your eyeball becomes swollen, red, or painful. This information is not intended to replace advice given to you by your health care provider. Make sure you discuss any questions you have with your health care provider. Document Released: 03/16/2005 Document Revised: 01/31/2016 Document Reviewed: 09/20/2013 Elsevier Interactive Patient Education  2018 [employer]. Sinusitis, Adult Sinusitis is soreness and inflammation of your sinuses. Sinuses are hollow spaces in the bones around your face. They are located:  Around your eyes.  In the middle of your forehead.  Behind your nose.  In your cheekbones.  Your sinuses and nasal passages are lined with a stringy fluid (mucus). Mucus normally drains out of your sinuses. When your nasal tissues get inflamed or swollen, the mucus can get trapped or blocked so air cannot flow through your sinuses. This lets bacteria, viruses, and funguses grow, and that leads to infection. Follow these instructions at home: Medicines  Take, use, or apply over-the-counter and prescription medicines only as told by your doctor. These may include nasal sprays.  If you were prescribed an antibiotic medicine, take it as told by your doctor. Do not stop taking the antibiotic even if you start to feel better. Hydrate and Humidify  Drink enough water to keep your pee (urine) clear or pale yellow.  Use a cool mist humidifier to keep the humidity level in your home above 50%.  Breathe in steam for 10-15 minutes, 3-4 times a day or as told by your doctor.  You can do this in the bathroom while a hot shower is running.  Try not to spend time in cool or dry air. Rest  Rest as much as possible.  Sleep with your head raised (elevated).  Make sure to get enough sleep each night. General instructions  Put a warm, moist washcloth on your face 3-4 times a day or as told by your doctor. This will help with discomfort.  Wash your hands often with soap and water. If there is no soap and water, use hand sanitizer.  Do not smoke. Avoid being around people who are smoking (secondhand smoke).  Keep all follow-up visits as told by your doctor. This is important. Contact a doctor if:  You have a fever.  Your symptoms get worse.  Your symptoms do not get better within 10 days. Get help right away if:  You have a very bad headache.  You cannot stop throwing up (vomiting).  You have pain or swelling around your face or eyes.  You have trouble seeing.  You feel confused.  Your neck is stiff.  You have trouble breathing. This information is not intended to replace advice given to you by your health care provider. Make sure you discuss any questions you have with your health care provider. Document Released: 11/23/2007 Document Revised: 01/31/2016 Document Reviewed: 04/01/2015 Elsevier Interactive Patient Education  [employer].

## 2017-08-02 ENCOUNTER — Other Ambulatory Visit: Payer: Self-pay | Admitting: Physician Assistant

## 2017-08-02 DIAGNOSIS — F321 Major depressive disorder, single episode, moderate: Secondary | ICD-10-CM

## 2017-08-02 MED ORDER — BUPROPION HCL ER (XL) 150 MG PO TB24: 150.0000 mg | ORAL_TABLET | Freq: Every day | ORAL | 1 refills | Status: DC

## 2017-08-02 NOTE — Progress Notes (Signed)
Wellbutrin refilled

## 2017-08-29 ENCOUNTER — Ambulatory Visit: Payer: BLUE CROSS/BLUE SHIELD | Admitting: Physician Assistant

## 2017-08-29 ENCOUNTER — Encounter: Payer: Self-pay | Admitting: Physician Assistant

## 2017-08-29 VITALS — BP 100/70 | HR 73 | Resp 16 | Wt 190.4 lb

## 2017-08-29 DIAGNOSIS — F321 Major depressive disorder, single episode, moderate: Secondary | ICD-10-CM

## 2017-08-29 MED ORDER — BUPROPION HCL ER (XL) 300 MG PO TB24: 300.0000 mg | ORAL_TABLET | Freq: Every day | ORAL | 3 refills | Status: DC

## 2017-08-29 NOTE — Progress Notes (Signed)
Patient: Wendy [name] Female    DOB: 07/15/1975   42 y.o.   MRN: [id number] Visit Date: 08/29/2017  Today's Provider: Mar Daring, PA-C   Chief Complaint  Patient presents with  . Follow-up    Depression   Subjective:    HPI Patient is coming here today to follow-up on Depression. She reports it has been a month taking the Bupropion. Patient reports she feels symptoms are unchanged. She reports that she feels fatigue, depressed mood, no energy, not motivated. Denies any suicidal ideation. She goes to counseling once a week.     Allergies  Allergen Reactions  . Peanuts [Peanut Oil] Shortness Of Breath  . Banana Itching  . Other Other (See Comments)    Walnuts- makes tongue blister  . Codeine Nausea And Vomiting  . Eggs Or Egg-Derived Products Nausea And Vomiting  . Vicodin [Hydrocodone-Acetaminophen] Nausea And Vomiting     Current Outpatient Medications:  .  buPROPion (WELLBUTRIN XL) 150 MG 24 hr tablet, Take 1 tablet (150 mg total) by mouth daily., Disp: 90 tablet, Rfl: 1 .  Cholecalciferol (VITAMIN D PO), Take by mouth., Disp: , Rfl:  .  MAGNESIUM CARBONATE PO, Take by mouth., Disp: , Rfl:  .  Melatonin 5 MG CHEW, Chew by mouth., Disp: , Rfl:  .  Multiple Vitamin (MULTI VITAMIN DAILY PO), Take by mouth., Disp: , Rfl:   Review of Systems  Constitutional: Positive for fatigue.  Respiratory: Negative.   Cardiovascular: Negative.   Gastrointestinal: Negative.   Neurological: Negative.   Psychiatric/Behavioral: Positive for dysphoric mood and sleep disturbance. Negative for suicidal ideas. The patient is nervous/anxious.     Social History   Tobacco Use  . Smoking status: Never Smoker  . Smokeless tobacco: Never Used  Substance Use Topics  . Alcohol use: Yes    Comment: occasional/rare   Objective:   BP 100/70 (BP Location: Right Arm, Patient Position: Sitting, Cuff Size: Normal)   Pulse 73   Resp 16   Wt 190 lb 6.4 oz (86.4 kg)   BMI 32.68  kg/m  Vitals:   08/29/17 0853  BP: 100/70  Pulse: 73  Resp: 16  Weight: 190 lb 6.4 oz (86.4 kg)     Physical Exam  Constitutional: She appears well-developed and well-nourished. No distress.  Neck: Normal range of motion. Neck supple. No tracheal deviation present. No thyromegaly present.  Cardiovascular: Normal rate, regular rhythm and normal heart sounds. Exam reveals no gallop and no friction rub.  No murmur heard. Pulmonary/Chest: Effort normal and breath sounds normal. No respiratory distress. She has no wheezes. She has no rales.  Lymphadenopathy:    She has no cervical adenopathy.  Skin: She is not diaphoretic.  Psychiatric: She has a normal mood and affect. Her behavior is normal. Judgment and thought content normal.  Vitals reviewed.   Depression screen PHQ 2/9 08/29/2017  Decreased Interest 3  Down, Depressed, Hopeless 3  PHQ - 2 Score 6  Altered sleeping 2  Tired, decreased energy 1  Change in appetite 3  Feeling bad or failure about yourself  3  Trouble concentrating 0  Moving slowly or fidgety/restless 0  Suicidal thoughts 0  PHQ-9 Score 15  Difficult doing work/chores Somewhat difficult      Assessment & Plan:     1. Depression, major, single episode, moderate (HCC) Increase wellbutrin to 300mg  from 150mg . Continue counseling weekly. I will see her back in 3 months for recheck and will  also be due for CPE.  - buPROPion (WELLBUTRIN XL) 300 MG 24 hr tablet; Take 1 tablet (300 mg total) by mouth daily.  Dispense: 90 tablet; Refill: [place], PA-C  [facility]

## 2017-09-01 ENCOUNTER — Telehealth: Payer: Self-pay

## 2017-09-01 NOTE — Telephone Encounter (Signed)
[facility]- patient to schedule her 3 month follow-up depression and physical around 06/12.  Thanks,  -Joseline

## 2017-09-05 NOTE — Telephone Encounter (Signed)
LMTCB  Thanks,  -Joseline 

## 2017-12-01 ENCOUNTER — Encounter: Payer: Self-pay | Admitting: Physician Assistant

## 2017-12-26 ENCOUNTER — Encounter: Payer: Self-pay | Admitting: Physician Assistant

## 2018-01-04 ENCOUNTER — Encounter: Payer: Self-pay | Admitting: Physician Assistant

## 2018-01-25 ENCOUNTER — Other Ambulatory Visit (HOSPITAL_COMMUNITY)
Admission: RE | Admit: 2018-01-25 | Discharge: 2018-01-25 | Disposition: A | Discharge status: Home / Self Care | Payer: BLUE CROSS/BLUE SHIELD | Source: Ambulatory Visit | Attending: Physician Assistant | Admitting: Physician Assistant

## 2018-01-25 ENCOUNTER — Ambulatory Visit (INDEPENDENT_AMBULATORY_CARE_PROVIDER_SITE_OTHER): Payer: BLUE CROSS/BLUE SHIELD | Admitting: Physician Assistant

## 2018-01-25 ENCOUNTER — Encounter: Payer: Self-pay | Admitting: Physician Assistant

## 2018-01-25 ENCOUNTER — Other Ambulatory Visit: Payer: Self-pay | Admitting: Physician Assistant

## 2018-01-25 VITALS — BP 110/70 | HR 88 | Temp 98.3°F | Resp 16 | Ht 64.0 in | Wt 190.0 lb

## 2018-01-25 DIAGNOSIS — R87610 Atypical squamous cells of undetermined significance on cytologic smear of cervix (ASC-US): Secondary | ICD-10-CM

## 2018-01-25 DIAGNOSIS — F3341 Major depressive disorder, recurrent, in partial remission: Secondary | ICD-10-CM

## 2018-01-25 DIAGNOSIS — Z124 Encounter for screening for malignant neoplasm of cervix: Secondary | ICD-10-CM | POA: Diagnosis not present

## 2018-01-25 DIAGNOSIS — N3281 Overactive bladder: Secondary | ICD-10-CM

## 2018-01-25 DIAGNOSIS — Z1239 Encounter for other screening for malignant neoplasm of breast: Secondary | ICD-10-CM

## 2018-01-25 DIAGNOSIS — N3946 Mixed incontinence: Secondary | ICD-10-CM

## 2018-01-25 DIAGNOSIS — Z1231 Encounter for screening mammogram for malignant neoplasm of breast: Secondary | ICD-10-CM | POA: Diagnosis not present

## 2018-01-25 DIAGNOSIS — Z6832 Body mass index (BMI) 32.0-32.9, adult: Secondary | ICD-10-CM

## 2018-01-25 DIAGNOSIS — Z0000 Encounter for general adult medical examination without abnormal findings: Secondary | ICD-10-CM

## 2018-01-25 DIAGNOSIS — F411 Generalized anxiety disorder: Secondary | ICD-10-CM

## 2018-01-25 MED ORDER — ALPRAZOLAM 0.5 MG PO TABS: 0.2500 mg | ORAL_TABLET | Freq: Two times a day (BID) | ORAL | 0 refills | Status: DC | PRN

## 2018-01-25 NOTE — Patient Instructions (Signed)
Health Maintenance, Female Adopting a healthy lifestyle and getting preventive care can go a long way to promote health and wellness. Talk with your health care provider about what schedule of regular examinations is right for you. This is a good chance for you to check in with your provider about disease prevention and staying healthy. In between checkups, there are plenty of things you can do on your own. Experts have done a lot of research about which lifestyle changes and preventive measures are most likely to keep you healthy. Ask your health care provider for more information. Weight and diet Eat a healthy diet  Be sure to include plenty of vegetables, fruits, low-fat dairy products, and lean protein.  Do not eat a lot of foods high in solid fats, added sugars, or salt.  Get regular exercise. This is one of the most important things you can do for your health. ? Most adults should exercise for at least 150 minutes each week. The exercise should increase your heart rate and make you sweat (moderate-intensity exercise). ? Most adults should also do strengthening exercises at least twice a week. This is in addition to the moderate-intensity exercise.  Maintain a healthy weight  Body mass index (BMI) is a measurement that can be used to identify possible weight problems. It estimates body fat based on height and weight. Your health care provider can help determine your BMI and help you achieve or maintain a healthy weight.  For females 20 years of age and older: ? A BMI below 18.5 is considered underweight. ? A BMI of 18.5 to 24.9 is normal. ? A BMI of 25 to 29.9 is considered overweight. ? A BMI of 30 and above is considered obese.  Watch levels of cholesterol and blood lipids  You should start having your blood tested for lipids and cholesterol at 42 years of age, then have this test every 5 years.  You may need to have your cholesterol levels checked more often if: ? Your lipid or  cholesterol levels are high. ? You are older than 42 years of age. ? You are at high risk for heart disease.  Cancer screening Lung Cancer  Lung cancer screening is recommended for adults 55-80 years old who are at high risk for lung cancer because of a history of smoking.  A yearly low-dose CT scan of the lungs is recommended for people who: ? Currently smoke. ? Have quit within the past 15 years. ? Have at least a 30-pack-year history of smoking. A pack year is smoking an average of one pack of cigarettes a day for 1 year.  Yearly screening should continue until it has been 15 years since you quit.  Yearly screening should stop if you develop a health problem that would prevent you from having lung cancer treatment.  Breast Cancer  Practice breast self-awareness. This means understanding how your breasts normally appear and feel.  It also means doing regular breast self-exams. Let your health care provider know about any changes, no matter how small.  If you are in your 20s or 30s, you should have a clinical breast exam (CBE) by a health care provider every 1-3 years as part of a regular health exam.  If you are 40 or older, have a CBE every year. Also consider having a breast X-ray (mammogram) every year.  If you have a family history of breast cancer, talk to your health care provider about genetic screening.  If you are at high risk   for breast cancer, talk to your health care provider about having an MRI and a mammogram every year.  Breast cancer gene (BRCA) assessment is recommended for women who have family members with BRCA-related cancers. BRCA-related cancers include: ? Breast. ? Ovarian. ? Tubal. ? Peritoneal cancers.  Results of the assessment will determine the need for genetic counseling and BRCA1 and BRCA2 testing.  Cervical Cancer Your health care provider may recommend that you be screened regularly for cancer of the pelvic organs (ovaries, uterus, and  vagina). This screening involves a pelvic examination, including checking for microscopic changes to the surface of your cervix (Pap test). You may be encouraged to have this screening done every 3 years, beginning at age 22.  For women ages 56-65, health care providers may recommend pelvic exams and Pap testing every 3 years, or they may recommend the Pap and pelvic exam, combined with testing for human papilloma virus (HPV), every 5 years. Some types of HPV increase your risk of cervical cancer. Testing for HPV may also be done on women of any age with unclear Pap test results.  Other health care providers may not recommend any screening for nonpregnant women who are considered low risk for pelvic cancer and who do not have symptoms. Ask your health care provider if a screening pelvic exam is right for you.  If you have had past treatment for cervical cancer or a condition that could lead to cancer, you need Pap tests and screening for cancer for at least 20 years after your treatment. If Pap tests have been discontinued, your risk factors (such as having a new sexual partner) need to be reassessed to determine if screening should resume. Some women have medical problems that increase the chance of getting cervical cancer. In these cases, your health care provider may recommend more frequent screening and Pap tests.  Colorectal Cancer  This type of cancer can be detected and often prevented.  Routine colorectal cancer screening usually begins at 42 years of age and continues through 42 years of age.  Your health care provider may recommend screening at an earlier age if you have risk factors for colon cancer.  Your health care provider may also recommend using home test kits to check for hidden blood in the stool.  A small camera at the end of a tube can be used to examine your colon directly (sigmoidoscopy or colonoscopy). This is done to check for the earliest forms of colorectal  cancer.  Routine screening usually begins at age 33.  Direct examination of the colon should be repeated every 5-10 years through 43 years of age. However, you may need to be screened more often if early forms of precancerous polyps or small growths are found.  Skin Cancer  Check your skin from head to toe regularly.  Tell your health care provider about any new moles or changes in moles, especially if there is a change in a mole's shape or color.  Also tell your health care provider if you have a mole that is larger than the size of a pencil eraser.  Always use sunscreen. Apply sunscreen liberally and repeatedly throughout the day.  Protect yourself by wearing long sleeves, pants, a wide-brimmed hat, and sunglasses whenever you are outside.  Heart disease, diabetes, and high blood pressure  High blood pressure causes heart disease and increases the risk of stroke. High blood pressure is more likely to develop in: ? People who have blood pressure in the high end of  the normal range (130-139/85-89 mm Hg). ? People who are overweight or obese. ? People who are African American.  If you are 21-29 years of age, have your blood pressure checked every 3-5 years. If you are 3 years of age or older, have your blood pressure checked every year. You should have your blood pressure measured twice-once when you are at a hospital or clinic, and once when you are not at a hospital or clinic. Record the average of the two measurements. To check your blood pressure when you are not at a hospital or clinic, you can use: ? An automated blood pressure machine at a pharmacy. ? A home blood pressure monitor.  If you are between 17 years and 37 years old, ask your health care provider if you should take aspirin to prevent strokes.  Have regular diabetes screenings. This involves taking a blood sample to check your fasting blood sugar level. ? If you are at a normal weight and have a low risk for diabetes,  have this test once every three years after 42 years of age. ? If you are overweight and have a high risk for diabetes, consider being tested at a younger age or more often. Preventing infection Hepatitis B  If you have a higher risk for hepatitis B, you should be screened for this virus. You are considered at high risk for hepatitis B if: ? You were born in a country where hepatitis B is common. Ask your health care provider which countries are considered high risk. ? Your parents were born in a high-risk country, and you have not been immunized against hepatitis B (hepatitis B vaccine). ? You have HIV or AIDS. ? You use needles to inject street drugs. ? You live with someone who has hepatitis B. ? You have had sex with someone who has hepatitis B. ? You get hemodialysis treatment. ? You take certain medicines for conditions, including cancer, organ transplantation, and autoimmune conditions.  Hepatitis C  Blood testing is recommended for: ? Everyone born from [birth year] through 1965. ? Anyone with known risk factors for hepatitis C.  Sexually transmitted infections (STIs)  You should be screened for sexually transmitted infections (STIs) including gonorrhea and chlamydia if: ? You are sexually active and are younger than 42 years of age. ? You are older than 42 years of age and your health care provider tells you that you are at risk for this type of infection. ? Your sexual activity has changed since you were last screened and you are at an increased risk for chlamydia or gonorrhea. Ask your health care provider if you are at risk.  If you do not have HIV, but are at risk, it may be recommended that you take a prescription medicine daily to prevent HIV infection. This is called pre-exposure prophylaxis (PrEP). You are considered at risk if: ? You are sexually active and do not regularly use condoms or know the HIV status of your partner(s). ? You take drugs by injection. ? You are  sexually active with a partner who has HIV.  Talk with your health care provider about whether you are at high risk of being infected with HIV. If you choose to begin PrEP, you should first be tested for HIV. You should then be tested every 3 months for as long as you are taking PrEP. Pregnancy  If you are premenopausal and you may become pregnant, ask your health care provider about preconception counseling.  If you may become  pregnant, take 400 to 800 micrograms (mcg) of folic acid every day.  If you want to prevent pregnancy, talk to your health care provider about birth control (contraception). Osteoporosis and menopause  Osteoporosis is a disease in which the bones lose minerals and strength with aging. This can result in serious bone fractures. Your risk for osteoporosis can be identified using a bone density scan.  If you are 12 years of age or older, or if you are at risk for osteoporosis and fractures, ask your health care provider if you should be screened.  Ask your health care provider whether you should take a calcium or vitamin D supplement to lower your risk for osteoporosis.  Menopause may have certain physical symptoms and risks.  Hormone replacement therapy may reduce some of these symptoms and risks. Talk to your health care provider about whether hormone replacement therapy is right for you. Follow these instructions at home:  Schedule regular health, dental, and eye exams.  Stay current with your immunizations.  Do not use any tobacco products including cigarettes, chewing tobacco, or electronic cigarettes.  If you are pregnant, do not drink alcohol.  If you are breastfeeding, limit how much and how often you drink alcohol.  Limit alcohol intake to no more than 1 drink per day for nonpregnant women. One drink equals 12 ounces of beer, 5 ounces of wine, or 1 ounces of hard liquor.  Do not use street drugs.  Do not share needles.  Ask your health care  provider for help if you need support or information about quitting drugs.  Tell your health care provider if you often feel depressed.  Tell your health care provider if you have ever been abused or do not feel safe at home. This information is not intended to replace advice given to you by your health care provider. Make sure you discuss any questions you have with your health care provider. Document Released: 12/20/2010 Document Revised: 11/12/2015 Document Reviewed: 03/10/2015 Elsevier Interactive Patient Education  2018 [place] oral tablet What is this medicine? Vortioxetine (vor tee [employer] e teen) is used to treat depression. This medicine may be used for other purposes; ask your health care provider or pharmacist if you have questions. COMMON BRAND NAME(S): BRINTELLIX, Trintellix What should I tell my health care provider before I take this medicine? They need to know if you have any of these conditions: -bipolar disorder or a family history of bipolar disorder -bleeding disorders -drink alcohol -glaucoma -liver disease -low levels of sodium in the blood -seizures -suicidal thoughts, plans, or attempt; a previous suicide attempt by you or a family member -take medicines that treat or prevent blood clots -an unusual or allergic reaction to vortioxetine, other medicines, foods, dyes, or preservatives -pregnant or trying to get pregnant -breast-feeding How should I use this medicine? Take this medicine by mouth with a glass of water. Follow the directions on the prescription label. You can take it with or without food. If it upsets your stomach, take it with food. Take your medicine at regular intervals. Do not take it more often than directed. Do not stop taking this medicine suddenly except upon the advice of your doctor. Stopping this medicine too quickly may cause serious side effects or your condition may worsen. A special MedGuide will be given to you by the  pharmacist with each prescription and refill. Be sure to read this information carefully each time. Talk to your pediatrician regarding the use of this medicine in  children. Special care may be needed. Overdosage: If you think you have taken too much of this medicine contact a poison control center or emergency room at once. NOTE: This medicine is only for you. Do not share this medicine with others. What if I miss a dose? If you miss a dose, take it as soon as you can. If it is almost time for your next dose, take only that dose. Do not take double or extra doses. What may interact with this medicine? Do not take this medicine with any of the following medications: -linezolid -MAOIs like Carbex, Eldepryl, Marplan, Nardil, and Parnate -methylene blue (injected into a vein) This medicine may also interact with the following medications: -alcohol -aspirin and aspirin-like medicines -carbamazepine -certain medicines for depression, anxiety, or psychotic disturbances -certain medicines for migraine headache like almotriptan, eletriptan, frovatriptan, naratriptan, rizatriptan, sumatriptan, zolmitriptan -diuretics -fentanyl -furazolidone -isoniazid -medicines that treat or prevent blood clots like warfarin, enoxaparin, and dalteparin -NSAIDs, medicines for pain and inflammation, like ibuprofen or naproxen -phenytoin -procarbazine -quinidine -rasagiline -rifampin -supplements like St. John's wort, kava kava, valerian -tramadol -tryptophan This list may not describe all possible interactions. Give your health care provider a list of all the medicines, herbs, non-prescription drugs, or dietary supplements you use. Also tell them if you smoke, drink alcohol, or use illegal drugs. Some items may interact with your medicine. What should I watch for while using this medicine? Tell your doctor if your symptoms do not get better or if they get worse. Visit your doctor or health care professional  for regular checks on your progress. Because it may take several weeks to see the full effects of this medicine, it is important to continue your treatment as prescribed by your doctor. Patients and their families should watch out for new or worsening thoughts of suicide or depression. Also watch out for sudden changes in feelings such as feeling anxious, agitated, panicky, irritable, hostile, aggressive, impulsive, severely restless, overly excited and hyperactive, or not being able to sleep. If this happens, especially at the beginning of treatment or after a change in dose, call your health care professional. Dennis Bast may get drowsy or dizzy. Do not drive, use machinery, or do anything that needs mental alertness until you know how this medicine affects you. Do not stand or sit up quickly, especially if you are an older patient. This reduces the risk of dizzy or fainting spells. Alcohol may interfere with the effect of this medicine. Avoid alcoholic drinks. Your mouth may get dry. Chewing sugarless gum or sucking hard candy, and drinking plenty of water may help. Contact your doctor if the problem does not go away or is severe. What side effects may I notice from receiving this medicine? Side effects that you should report to your doctor or health care professional as soon as possible: -allergic reactions like skin rash, itching or hives, swelling of the face, lips, or tongue -anxious -black, tarry stools -changes in vision -confusion -elevated mood, decreased need for sleep, racing thoughts, impulsive behavior -eye pain -fast, irregular heartbeat -feeling faint or lightheaded, falls -feeling agitated, angry, or irritable -hallucination, loss of contact with reality -loss of balance or coordination -loss of memory -painful or prolonged erections -restlessness, pacing, inability to keep still -seizures -stiff muscles -suicidal thoughts or other mood changes -trouble sleeping -unusual bleeding  or bruising -unusually weak or tired -vomiting Side effects that usually do not require medical attention (report to your doctor or health care professional if they continue or are  bothersome): -change in appetite or weight -change in sex drive or performance -constipation -dizziness -dry mouth -nausea This list may not describe all possible side effects. Call your doctor for medical advice about side effects. You may report side effects to FDA at 1-800-FDA-1088. Where should I keep my medicine? Keep out of the reach of children. Store at room temperature between 15 and 30 degrees C (59 and 86 degrees F). Throw away any unused medicine after the expiration date. NOTE: This sheet is a summary. It may not cover all possible information. If you have questions about this medicine, talk to your doctor, pharmacist, or health care provider.  2018 Elsevier/Gold Standard (2015-11-05 16:45:13)

## 2018-01-25 NOTE — Progress Notes (Signed)
Patient: Wendy [name], Female    DOB: 09-01-1975, 42 y.o.   MRN: [id number] Visit Date: 01/25/2018  Today's Provider: Mar Daring, PA-C   Chief Complaint  Patient presents with  . Annual Exam   Subjective:    Annual physical exam Wendy [name] is a 42 y.o. female who presents today for health maintenance and complete physical. She feels well. She reports exercising active with daily activites. She reports she is sleeping fairly well. 11/17/15 Pap-abnormal with atypical squamous cells.  11/18/16 CPE 11/18/16 Pap-neg, patient advised to repeat yearly for the next 2 years. 09/09/14 Mammogram-BI-RADS 1 ----------------------------------------------------------------- Patient reports that she was having anxiety attacks with wellbutrin, patient reports symptoms were worse at night. Patient reports she has been off medication for 3 days after weaning off. Patient reports she was doing one tablet every other day.  Review of Systems  Constitutional: Negative.   HENT: Negative.   Eyes: Negative.   Respiratory: Negative.   Cardiovascular: Negative.   Gastrointestinal: Negative.   Endocrine: Negative.   Genitourinary: Negative.   Musculoskeletal: Negative.   Skin: Negative.   Allergic/Immunologic: Negative.   Neurological: Negative.   Hematological: Negative.   Psychiatric/Behavioral: Negative.     Social History      She  reports that she has never smoked. She has never used smokeless tobacco. She reports that she drinks alcohol. She reports that she does not use drugs.       Social History   Socioeconomic History  . Marital status: Married    Spouse name: Not on file  . Number of children: 2  . Years of education: Not on file  . Highest education level: Not on file  Occupational History  . Not on file  Social Needs  . Financial resource strain: Not on file  . Food insecurity:    Worry: Not on file    Inability: Not on file  . Transportation needs:   Medical: Not on file    Non-medical: Not on file  Tobacco Use  . Smoking status: Never Smoker  . Smokeless tobacco: Never Used  Substance and Sexual Activity  . Alcohol use: Yes    Comment: occasional/rare  . Drug use: No  . Sexual activity: Yes    Partners: Male    Birth control/protection: Surgical    Comment: Tubaligation  Lifestyle  . Physical activity:    Days per week: Not on file    Minutes per session: Not on file  . Stress: Not on file  Relationships  . Social connections:    Talks on phone: Not on file    Gets together: Not on file    Attends religious service: Not on file    Active member of club or organization: Not on file    Attends meetings of clubs or organizations: Not on file    Relationship status: Not on file  Other Topics Concern  . Not on file  Social History Narrative  . Not on file    Past Medical History:  Diagnosis Date  . Abnormal Pap smear of cervix 10/2011  . Allergy   . Anxiety   . Depression   . Heart murmur    slight     Patient Active Problem List   Diagnosis Date Noted  . GAD (generalized anxiety disorder) 11/05/2015  . ADHD (attention deficit hyperactivity disorder) 11/05/2015  . Obese 11/05/2015  . Urinary incontinence in female 11/05/2015  . Constipation 11/05/2015  . Menstrual migraine  11/05/2015  . Contraception management 05/02/2012  . Depression 02/13/2012    Past Surgical History:  Procedure Laterality Date  . CESAREAN SECTION  2004   x1  . LAPAROSCOPIC TUBAL LIGATION  05/02/2012   Procedure: LAPAROSCOPIC TUBAL LIGATION;  Surgeon: Emily Filbert, MD;  Location: [facility];  Service: Gynecology;  Laterality: Bilateral;  with filshie clips  . TUBAL LIGATION  04/2012  . WISDOM TOOTH EXTRACTION     x4  . WRIST SURGERY     rigth cyst removal.    Family History        Family Status  Relation Name Status  . Mother  Alive  . Father  Alive  . MGM  Alive  . MGF  Deceased  . PGM  Alive  . PGF  Deceased  . Daughter   Alive  . Daughter  Alive        Her family history includes Alcohol abuse in her father; Arthritis in her mother; Asthma in her paternal grandmother; Emphysema in her maternal grandfather; Heart disease in her father, maternal grandfather, maternal grandmother, and paternal grandfather; Hypothyroidism in her mother.      Allergies  Allergen Reactions  . Peanuts [Peanut Oil] Shortness Of Breath  . Banana Itching  . Other Other (See Comments)    Walnuts- makes tongue blister  . Codeine Nausea And Vomiting  . Eggs Or Egg-Derived Products Nausea And Vomiting  . Vicodin [Hydrocodone-Acetaminophen] Nausea And Vomiting     Current Outpatient Medications:  .  Cholecalciferol (VITAMIN D PO), Take by mouth., Disp: , Rfl:  .  MAGNESIUM CARBONATE PO, Take by mouth., Disp: , Rfl:  .  Melatonin 5 MG CHEW, Chew by mouth., Disp: , Rfl:  .  Multiple Vitamin (MULTI VITAMIN DAILY PO), Take by mouth., Disp: , Rfl:  .  buPROPion (WELLBUTRIN XL) 300 MG 24 hr tablet, Take 1 tablet (300 mg total) by mouth daily. (Patient not taking: Reported on 01/25/2018), Disp: 90 tablet, Rfl: 3   Patient Care Team: Mar Daring, PA-C as PCP - General (Family Medicine)      Objective:   Vitals: BP 110/70 (BP Location: Left Arm, Patient Position: Sitting, Cuff Size: Normal)   Pulse 88   Temp 98.3 F (36.8 C) (Oral)   Resp 16   Ht 5\' 4"  (1.626 m)   Wt 190 lb (86.2 kg)   LMP 01/11/2018 (Approximate)   SpO2 97%   BMI 32.61 kg/m    Vitals:   01/25/18 1512  BP: 110/70  Pulse: 88  Resp: 16  Temp: 98.3 F (36.8 C)  TempSrc: Oral  SpO2: 97%  Weight: 190 lb (86.2 kg)  Height: 5\' 4"  (1.626 m)     Physical Exam  Constitutional: She is oriented to person, place, and time. She appears well-developed and well-nourished. No distress.  HENT:  Head: Normocephalic and atraumatic.  Right Ear: Hearing, tympanic membrane, external ear and ear canal normal.  Left Ear: Hearing, tympanic membrane, external ear  and ear canal normal.  Nose: Nose normal.  Mouth/Throat: Uvula is midline, oropharynx is clear and moist and mucous membranes are normal. No oropharyngeal exudate.  Eyes: Pupils are equal, round, and reactive to light. Conjunctivae and EOM are normal. Right eye exhibits no discharge. Left eye exhibits no discharge. No scleral icterus.  Neck: Normal range of motion. Neck supple. No JVD present. Carotid bruit is not present. No tracheal deviation present. No thyromegaly present.  Cardiovascular: Normal rate, regular rhythm, normal heart sounds and  intact distal pulses. Exam reveals no gallop and no friction rub.  No murmur heard. Pulmonary/Chest: Effort normal and breath sounds normal. No respiratory distress. She has no wheezes. She has no rales. She exhibits no tenderness. Right breast exhibits no inverted nipple, no mass, no nipple discharge, no skin change and no tenderness. Left breast exhibits no inverted nipple, no mass, no nipple discharge, no skin change and no tenderness. No breast tenderness, discharge or bleeding. Breasts are symmetrical.  Abdominal: Soft. Bowel sounds are normal. She exhibits no distension and no mass. There is no tenderness. There is no rebound and no guarding. Hernia confirmed negative in the right inguinal area and confirmed negative in the left inguinal area.  Genitourinary: Rectum normal, vagina normal and uterus normal. No breast tenderness, discharge or bleeding. Pelvic exam was performed with patient supine. There is no rash, tenderness, lesion or injury on the right labia. There is no rash, tenderness, lesion or injury on the left labia. Cervix exhibits no motion tenderness, no discharge and no friability. Right adnexum displays no mass, no tenderness and no fullness. Left adnexum displays no mass, no tenderness and no fullness. No erythema, tenderness or bleeding in the vagina. No signs of injury around the vagina. No vaginal discharge found.  Musculoskeletal: Normal  range of motion. She exhibits no edema or tenderness.  Lymphadenopathy:    She has no cervical adenopathy.       Right: No inguinal adenopathy present.       Left: No inguinal adenopathy present.  Neurological: She is alert and oriented to person, place, and time. She has normal reflexes. No cranial nerve deficit. Coordination normal.  Skin: Skin is warm and dry. No rash noted. She is not diaphoretic.  Psychiatric: She has a normal mood and affect. Her behavior is normal. Judgment and thought content normal.  Vitals reviewed.    Depression Screen PHQ 2/9 Scores 08/29/2017 11/18/2016  PHQ - 2 Score 6 0  PHQ- 9 Score 15 0      Assessment & Plan:     Routine Health Maintenance and Physical Exam  Exercise Activities and Dietary recommendations Goals   None     Immunization History  Administered Date(s) Administered  . Tdap 11/18/2016    Health Maintenance  Topic Date Due  . PAP SMEAR  11/18/2017  . INFLUENZA VACCINE  01/18/2018  . TETANUS/TDAP  11/19/2026  . HIV Screening  Completed     Discussed health benefits of physical activity, and encouraged her to engage in regular exercise appropriate for her age and condition.    1. Annual physical exam Normal physical exam today. Will check labs as below and f/u pending lab results. If labs are stable and WNL she will not need to have these rechecked for one year at her next annual physical exam. She is to call the office in the meantime if she has any acute issue, questions or concerns. - CBC w/Diff/Platelet - Comprehensive Metabolic Panel (CMET) - TSH - Lipid Profile - HgB A1c  2. Cervical cancer screening Pap collected today. Will send as below and f/u pending results. - Cytology - PAP  3. Atypical squamous cells of undetermined significance on cytologic smear of cervix (ASC-US) - Cytology - PAP  4. Breast cancer screening Breast exam today was normal. There is no family history of breast cancer. She does perform  regular self breast exams. Mammogram was ordered as below. Information for [facility] was given to patient so she may schedule her  mammogram at her convenience. - MM Digital Screening; Future  5. Recurrent major depressive disorder, in partial remission (HCC) Stable. Diagnosis pulled for medication refill. Continue current medical treatment plan. - ALPRAZolam (XANAX) 0.5 MG tablet; Take 0.5-1 tablets (0.25-0.5 mg total) by mouth 2 (two) times daily as needed for anxiety.  Dispense: 60 tablet; Refill: 0  6. GAD (generalized anxiety disorder) Stable. Diagnosis pulled for medication refill. Continue current medical treatment plan. - TSH - ALPRAZolam (XANAX) 0.5 MG tablet; Take 0.5-1 tablets (0.25-0.5 mg total) by mouth 2 (two) times daily as needed for anxiety.  Dispense: 60 tablet; Refill: 0  7. BMI 32.0-32.9,adult Counseled patient on healthy lifestyle modifications including dieting and exercise.  - Lipid Profile - HgB A1c  8. Mixed stress and urge urinary incontinence Has had issues since she was a child. Had nighttime enuresis until 35 yr old. Since having children it has worsened and continues to progress with age. She is not interested in medications at this time, but is interested in pelvic floor therapy. Referral placed as below.  - Ambulatory referral to Physical Therapy  9. OAB (overactive bladder) See above medical treatment plan. - Ambulatory referral to Physical Therapy  --------------------------------------------------------------------    Mar Daring, PA-C  [facility]

## 2018-01-30 ENCOUNTER — Telehealth: Payer: Self-pay

## 2018-01-30 LAB — CYTOLOGY - PAP
Diagnosis: NEGATIVE
HPV: NOT DETECTED

## 2018-01-30 NOTE — Telephone Encounter (Signed)
LMTCB  Thanks,  -Joseline 

## 2018-01-30 NOTE — Telephone Encounter (Signed)
-----   Message from Mar Daring, [place] sent at 01/30/2018 12:51 PM EDT ----- Pap is normal, HPV negative.  Will repeat in 3-5 years.

## 2018-01-30 NOTE — Telephone Encounter (Signed)
Patient advised.

## 2018-02-01 ENCOUNTER — Encounter: Payer: Self-pay | Admitting: Physician Assistant

## 2018-02-20 ENCOUNTER — Telehealth: Payer: Self-pay | Admitting: Physician Assistant

## 2018-02-20 DIAGNOSIS — F321 Major depressive disorder, single episode, moderate: Secondary | ICD-10-CM

## 2018-02-20 MED ORDER — VORTIOXETINE HBR 10 MG PO TABS: 10.0000 mg | ORAL_TABLET | Freq: Every day | ORAL | 1 refills | Status: DC

## 2018-02-20 NOTE — Telephone Encounter (Signed)
Pt needs a refill on Trintelex 10 mg.  She only has a couple days left.  It is working.  She has taken the samples.  [facility]  Pt's CB# [phone]  Thanks Con Memos

## 2018-02-20 NOTE — Telephone Encounter (Signed)
Refilled

## 2018-03-26 ENCOUNTER — Other Ambulatory Visit: Payer: Self-pay | Admitting: Physician Assistant

## 2018-03-26 DIAGNOSIS — F3341 Major depressive disorder, recurrent, in partial remission: Secondary | ICD-10-CM

## 2018-03-26 DIAGNOSIS — F411 Generalized anxiety disorder: Secondary | ICD-10-CM

## 2018-05-30 ENCOUNTER — Ambulatory Visit: Payer: Self-pay | Admitting: Physician Assistant

## 2018-06-01 ENCOUNTER — Ambulatory Visit: Payer: BLUE CROSS/BLUE SHIELD | Admitting: Physician Assistant

## 2018-06-01 ENCOUNTER — Encounter: Payer: Self-pay | Admitting: Physician Assistant

## 2018-06-01 VITALS — BP 122/88 | HR 118 | Temp 98.0°F | Resp 16 | Ht 63.75 in | Wt 173.0 lb

## 2018-06-01 DIAGNOSIS — Z136 Encounter for screening for cardiovascular disorders: Secondary | ICD-10-CM

## 2018-06-01 DIAGNOSIS — Z113 Encounter for screening for infections with a predominantly sexual mode of transmission: Secondary | ICD-10-CM

## 2018-06-01 DIAGNOSIS — Z13220 Encounter for screening for lipoid disorders: Secondary | ICD-10-CM | POA: Diagnosis not present

## 2018-06-01 DIAGNOSIS — Z713 Dietary counseling and surveillance: Secondary | ICD-10-CM

## 2018-06-01 NOTE — Progress Notes (Signed)
Patient: Wendy [name] Female    DOB: 04/16/1976   42 y.o.   MRN: [id number] Visit Date: 06/01/2018  Today's Provider: Mar Daring, PA-C   Chief Complaint  Patient presents with  . Obesity   Subjective:     HPI Patient here today for labs due to being on KETO diet, patient reports being on KETO diet for about 3 months. Patient reports eating a lot of bacon, sausage and other fats. Patient reports she is not exercising.   Wt Readings from Last 3 Encounters:  06/01/18 173 lb (78.5 kg)  01/25/18 190 lb (86.2 kg)  08/29/17 190 lb 6.4 oz (86.4 kg)    Allergies  Allergen Reactions  . Peanuts [Peanut Oil] Shortness Of Breath  . Banana Itching  . Other Other (See Comments)    Walnuts- makes tongue blister  . Codeine Nausea And Vomiting  . Eggs Or Egg-Derived Products Nausea And Vomiting  . Vicodin [Hydrocodone-Acetaminophen] Nausea And Vomiting     Current Outpatient Medications:  .  ALPRAZolam (XANAX) 0.5 MG tablet, TAKE 1/2-1 TABLET BY MOUTH TWICE DAILY AS NEEDED FOR ANXIETY, Disp: 60 tablet, Rfl: 5 .  Cholecalciferol (VITAMIN D PO), Take by mouth., Disp: , Rfl:  .  MAGNESIUM CARBONATE PO, Take by mouth., Disp: , Rfl:  .  Multiple Vitamin (MULTI VITAMIN DAILY PO), Take by mouth., Disp: , Rfl:  .  vortioxetine HBr (TRINTELLIX) 10 MG TABS tablet, Take 1 tablet (10 mg total) by mouth daily., Disp: 90 tablet, Rfl: 1 .  Melatonin 5 MG CHEW, Chew by mouth., Disp: , Rfl:   Review of Systems  Constitutional: Negative.   Respiratory: Negative.   Cardiovascular: Negative.   Endocrine: Negative.   Neurological: Negative.     Social History   Tobacco Use  . Smoking status: Never Smoker  . Smokeless tobacco: Never Used  Substance Use Topics  . Alcohol use: Yes    Comment: occasional/rare      Objective:   BP 122/88 (BP Location: Left Arm, Patient Position: Sitting, Cuff Size: Normal)   Pulse (!) 118   Temp 98 F (36.7 C) (Oral)   Resp 16   Ht 5' 3.75"  (1.619 m)   Wt 173 lb (78.5 kg)   BMI 29.93 kg/m  Vitals:   06/01/18 0845  BP: 122/88  Pulse: (!) 118  Resp: 16  Temp: 98 F (36.7 C)  TempSrc: Oral  Weight: 173 lb (78.5 kg)  Height: 5' 3.75" (1.619 m)     Physical Exam Vitals signs reviewed.  Constitutional:      General: She is not in acute distress.    Appearance: She is well-developed. She is not diaphoretic.  Neck:     Musculoskeletal: Normal range of motion and neck supple.     Thyroid: No thyromegaly.     Vascular: No JVD.     Trachea: No tracheal deviation.  Cardiovascular:     Rate and Rhythm: Normal rate and regular rhythm.     Heart sounds: Normal heart sounds. No murmur. No friction rub. No gallop.   Pulmonary:     Effort: Pulmonary effort is normal. No respiratory distress.     Breath sounds: Normal breath sounds. No wheezing or rales.  Lymphadenopathy:     Cervical: No cervical adenopathy.        Assessment & Plan    1. Ketogenic diet monitoring encounter Doing keto over last 3 months with 17 pound weight loss. Will check  labs as below and f/u pending results. - CBC w/Diff/Platelet - Comprehensive Metabolic Panel (CMET) - Lipid Profile  2. Encounter for lipid screening for cardiovascular disease See above medical treatment plan. - CBC w/Diff/Platelet - Comprehensive Metabolic Panel (CMET) - Lipid Profile  3. Screen for STD (sexually transmitted disease) Patient request screen due to being sexually active occasionally, no one partner.  - STD Screen (6)     Mar Daring, PA-C  [place]

## 2018-06-02 LAB — COMPREHENSIVE METABOLIC PANEL
ALBUMIN: 4.4 g/dL (ref 3.5–5.5)
ALK PHOS: 69 IU/L (ref 39–117)
ALT: 11 IU/L (ref 0–32)
AST: 15 IU/L (ref 0–40)
Albumin/Globulin Ratio: 2 (ref 1.2–2.2)
BUN / CREAT RATIO: 16 (ref 9–23)
BUN: 10 mg/dL (ref 6–24)
Bilirubin Total: 0.4 mg/dL (ref 0.0–1.2)
CO2: 21 mmol/L (ref 20–29)
Calcium: 9.3 mg/dL (ref 8.7–10.2)
Chloride: 102 mmol/L (ref 96–106)
Creatinine, Ser: 0.63 mg/dL (ref 0.57–1.00)
GFR calc Af Amer: 128 mL/min/{1.73_m2} (ref >59)
GFR calc non Af Amer: 111 mL/min/{1.73_m2} (ref >59)
GLUCOSE: 85 mg/dL (ref 65–99)
Globulin, Total: 2.2 g/dL (ref 1.5–4.5)
Potassium: 4 mmol/L (ref 3.5–5.2)
Sodium: 138 mmol/L (ref 134–144)
Total Protein: 6.6 g/dL (ref 6.0–8.5)

## 2018-06-02 LAB — CBC WITH DIFFERENTIAL/PLATELET
BASOS ABS: 0 10*3/uL (ref 0.0–0.2)
Basos: 1 %
EOS (ABSOLUTE): 0.1 10*3/uL (ref 0.0–0.4)
Eos: 1 %
HEMOGLOBIN: 12.9 g/dL (ref 11.1–15.9)
Hematocrit: 40.3 % (ref 34.0–46.6)
Immature Grans (Abs): 0 10*3/uL (ref 0.0–0.1)
Immature Granulocytes: 0 %
LYMPHS: 18 %
Lymphocytes Absolute: 1.1 10*3/uL (ref 0.7–3.1)
MCH: 29.3 pg (ref 26.6–33.0)
MCHC: 32 g/dL (ref 31.5–35.7)
MCV: 91 fL (ref 79–97)
MONOCYTES: 10 %
Monocytes Absolute: 0.6 10*3/uL (ref 0.1–0.9)
NEUTROS PCT: 70 %
Neutrophils Absolute: 4.1 10*3/uL (ref 1.4–7.0)
Platelets: 235 10*3/uL (ref 150–450)
RBC: 4.41 x10E6/uL (ref 3.77–5.28)
RDW: 12.5 % (ref 12.3–15.4)
WBC: 5.8 10*3/uL (ref 3.4–10.8)

## 2018-06-02 LAB — STD SCREEN (6)
HIV Screen 4th Generation wRfx: NONREACTIVE
HSV 1 Glycoprotein G Ab, IgG: <0.91 index (ref 0.00–0.90)
Hepatitis B Surface Ag: NEGATIVE
RPR Ser Ql: NONREACTIVE

## 2018-06-02 LAB — LIPID PANEL
CHOLESTEROL TOTAL: 201 mg/dL — AB (ref 100–199)
Chol/HDL Ratio: 2.7 ratio (ref 0.0–4.4)
HDL: 75 mg/dL (ref >39)
LDL Calculated: 106 mg/dL — ABNORMAL HIGH (ref 0–99)
Triglycerides: 101 mg/dL (ref 0–149)
VLDL CHOLESTEROL CAL: 20 mg/dL (ref 5–40)

## 2018-06-02 LAB — HCV COMMENT:

## 2018-06-04 ENCOUNTER — Telehealth: Payer: Self-pay

## 2018-06-04 ENCOUNTER — Telehealth: Payer: Self-pay | Admitting: Physician Assistant

## 2018-06-04 DIAGNOSIS — F339 Major depressive disorder, recurrent, unspecified: Secondary | ICD-10-CM

## 2018-06-04 MED ORDER — VORTIOXETINE HBR 5 MG PO TABS: 5.0000 mg | ORAL_TABLET | Freq: Every day | ORAL | 1 refills | Status: DC

## 2018-06-04 NOTE — Telephone Encounter (Signed)
LVMTRC 

## 2018-06-04 NOTE — Telephone Encounter (Signed)
Patient returned call and was advised. 

## 2018-06-04 NOTE — Telephone Encounter (Signed)
Sent in

## 2018-06-04 NOTE — Telephone Encounter (Signed)
Patient is having issues cutting the Trintelllix 10 mg. In half.   She is asking you to please call in the 5 mg. Trintellix to [facility] on [address].

## 2018-06-04 NOTE — Telephone Encounter (Signed)
-----   Message from Mar Daring, PA-C sent at 06/04/2018  8:00 AM EST ----- Cholesterol did increase just slightly. Still in a normal range. Total cholesterol went from 170 to 201 and LDL went from 96 to 106. HDL (good) increased from 67 to 75 which is great. All other labs are normal and stable. Screenings done were negative.

## 2018-06-05 ENCOUNTER — Ambulatory Visit
Admission: RE | Admit: 2018-06-05 | Discharge: 2018-06-05 | Disposition: A | Discharge status: Home / Self Care | Payer: BLUE CROSS/BLUE SHIELD | Source: Ambulatory Visit | Attending: Physician Assistant | Admitting: Physician Assistant

## 2018-06-05 DIAGNOSIS — Z1239 Encounter for other screening for malignant neoplasm of breast: Secondary | ICD-10-CM | POA: Diagnosis present

## 2018-09-06 ENCOUNTER — Other Ambulatory Visit: Payer: Self-pay

## 2018-09-06 ENCOUNTER — Ambulatory Visit (INDEPENDENT_AMBULATORY_CARE_PROVIDER_SITE_OTHER): Payer: BLUE CROSS/BLUE SHIELD | Admitting: Physician Assistant

## 2018-09-06 ENCOUNTER — Encounter: Payer: Self-pay | Admitting: Physician Assistant

## 2018-09-06 VITALS — BP 102/70 | HR 86 | Temp 98.5°F | Resp 16 | Wt 170.8 lb

## 2018-09-06 DIAGNOSIS — H05011 Cellulitis of right orbit: Secondary | ICD-10-CM | POA: Diagnosis not present

## 2018-09-06 MED ORDER — AMOXICILLIN 875 MG PO TABS: 875.0000 mg | ORAL_TABLET | Freq: Two times a day (BID) | ORAL | 0 refills | Status: AC

## 2018-09-06 MED ORDER — ONDANSETRON HCL 4 MG PO TABS: 4.0000 mg | ORAL_TABLET | Freq: Three times a day (TID) | ORAL | 0 refills | Status: DC | PRN

## 2018-09-06 MED ORDER — SULFAMETHOXAZOLE-TRIMETHOPRIM 800-160 MG PO TABS: 1.0000 | ORAL_TABLET | Freq: Two times a day (BID) | ORAL | 0 refills | Status: AC

## 2018-09-06 NOTE — Progress Notes (Signed)
Patient: Wendy [name] Female    DOB: 1975/09/12   43 y.o.   MRN: [id number] Visit Date: 09/06/2018  Today's Provider: Trinna Post, PA-C   Chief Complaint  Patient presents with  . Stye   Subjective:     HPI  Patient here today with c/o right eye swollen and red.Reports that she has been putting warm compresses. And stye reliever. No fever. No nausea, vomiting. No photophobia or vision loss.   Allergies: Patient reports that she gets allergies flares up around this time ans she has been feeling sob and has needing to use her inhaler and this morning was one of them.  Allergies  Allergen Reactions  . Peanuts [Peanut Oil] Shortness Of Breath  . Banana Itching  . Other Other (See Comments)    Walnuts- makes tongue blister  . Codeine Nausea And Vomiting  . Eggs Or Egg-Derived Products Nausea And Vomiting  . Vicodin [Hydrocodone-Acetaminophen] Nausea And Vomiting     Current Outpatient Medications:  .  ALPRAZolam (XANAX) 0.5 MG tablet, TAKE 1/2-1 TABLET BY MOUTH TWICE DAILY AS NEEDED FOR ANXIETY, Disp: 60 tablet, Rfl: 5 .  Cholecalciferol (VITAMIN D PO), Take by mouth., Disp: , Rfl:  .  MAGNESIUM CARBONATE PO, Take by mouth., Disp: , Rfl:  .  Melatonin 5 MG CHEW, Chew by mouth., Disp: , Rfl:  .  Multiple Vitamin (MULTI VITAMIN DAILY PO), Take by mouth., Disp: , Rfl:  .  vortioxetine HBr (TRINTELLIX) 5 MG TABS tablet, Take 1 tablet (5 mg total) by mouth daily., Disp: 90 tablet, Rfl: 1  Review of Systems  Social History   Tobacco Use  . Smoking status: Never Smoker  . Smokeless tobacco: Never Used  Substance Use Topics  . Alcohol use: Yes    Comment: occasional/rare      Objective:   BP 102/70 (BP Location: Left Arm, Patient Position: Sitting, Cuff Size: Large)   Pulse 86   Temp 98.5 F (36.9 C) (Oral)   Resp 16   Wt 170 lb 12.8 oz (77.5 kg)   BMI 29.55 kg/m  Vitals:   09/06/18 1100  BP: 102/70  Pulse: 86  Resp: 16  Temp: 98.5 F (36.9 C)   TempSrc: Oral  Weight: 170 lb 12.8 oz (77.5 kg)     Physical Exam Constitutional:      Appearance: Normal appearance.  Eyes:     General:        Right eye: No discharge.        Left eye: No discharge.     Extraocular Movements: Extraocular movements intact.     Conjunctiva/sclera: Conjunctivae normal.     Right eye: Right conjunctiva is not injected. No exudate.    Left eye: Left conjunctiva is not injected. No exudate.    Pupils: Pupils are equal, round, and reactive to light.     Comments: Right upper eyelid swollen and red. There is also grossly visible swelling and redness around the lateral aspect of her eye.   Skin:    General: Skin is warm and dry.  Neurological:     Mental Status: She is alert and oriented to person, place, and time. Mental status is at baseline.  Psychiatric:        Mood and Affect: Mood normal.        Behavior: Behavior normal.         Assessment & Plan    1. Orbital cellulitis on right  Instructed patient that  she should witness improvement in 24 hours. If she does not or has severe worsening, she will need to be seen in the ER. Touch base with [place] on Monday, she can send [place] a picture through mychart.   - sulfamethoxazole-trimethoprim (BACTRIM DS,SEPTRA DS) 800-160 MG tablet; Take 1 tablet by mouth 2 (two) times daily for 5 days.  Dispense: 10 tablet; Refill: 0 - amoxicillin (AMOXIL) 875 MG tablet; Take 1 tablet (875 mg total) by mouth 2 (two) times daily for 10 days.  Dispense: 20 tablet; Refill: 0 - ondansetron (ZOFRAN) 4 MG tablet; Take 1 tablet (4 mg total) by mouth every 8 (eight) hours as needed for nausea or vomiting.  Dispense: 20 tablet; Refill: [place], PA-C  [facility]

## 2018-09-06 NOTE — Patient Instructions (Signed)
Orbital Cellulitis    Orbital cellulitis is an infection in the eye socket (orbit) and the tissues that surround the eye. The infection can spread to the eyelids, eyebrow area, and cheek. It can also cause a pocket of pus to develop around the eye (orbital abscess). In severe cases, the infection can spread to the brain. Orbital cellulitis is a medical emergency.  What are the causes?  The most common cause of this condition is a bacterial infection. The infection usually spreads to the eye socket from another part of the body. The infection may start in one of these places:   Nose or sinuses.   Eyelids.   Facial skin.   Bloodstream.  What increases the risk?  You are more likely to develop this condition if you recently had one of the following:   Upper respiratory infection. This affects the nose, throat, and upper air passages.   Sinus infection.   Eyelid or facial infection.   Tooth infection.   Eye injury or an object on the surface of the eye or in the eyeball that should not be there (foreign body).   Infection that affects the entire body or the bloodstream (systemic infection).  What are the signs or symptoms?  Symptoms of this condition usually start quickly. Symptoms may include:   Eye pain that gets worse with eye movement.   Swelling around the eye.   Eye redness.   Bulging of the eye.   Inability to move the eye.   Double vision or decreased vision.   Fever.  How is this diagnosed?  This condition may be diagnosed based on your symptoms and an eye exam. You may also have tests to confirm the diagnosis and to check for an orbital abscess. Other tests (cultures) may be done to find out which specific bacteria are causing the infection. Tests may include:   Complete blood count (CBC).   Blood culture.   Nose, sinus, or throat culture.   Imaging studies, such as a CT scan or MRI.  How is this treated?  This condition is usually treated in a hospital. Antibiotic medicines are given  directly into a vein through an IV.   At first, you may get IV antibiotics to kill bacteria that often cause orbital cellulitis (broad spectrum antibiotics).   Your medicine may be changed if the culture test results suggest that another antibiotic would be better.   If the IV antibiotics are working to treat your infection, you may be switched to oral antibiotics and allowed to go home.   In some cases, surgery may be needed to drain an orbital abscess.  Follow these instructions at home:   Take over-the-counter and prescription medicines only as told by your health care provider.   Take your antibiotic medicine as told by your health care provider. Do not stop taking the antibiotic even if you start to feel better.   Return to your normal activities as told by your health care provider. Ask your health care provider what activities are safe for you.   Keep all follow-up visits as told by your health care provider. This is important.  Contact a health care provider if:   You have questions about your medicines.   Your pain is not well-controlled.  Get help right away if:   Your eye pain or swelling returns or it gets worse.   You have any changes in your vision.   You develop a fever.   You have vomiting.     You develop a severe headache or numbness in your face.  Summary   Orbital cellulitis is an infection in the eye socket (orbit) and the tissues that surround the eye. The infection can spread to other areas.   Symptoms usually start quickly. Some of the symptoms include eye pain that gets worse with movement, redness and swelling around the eye, and double or decreased vision.   Orbital cellulitis is a medical emergency, and it requires IV antibiotics for treatment.   See your health care provider right away if your symptoms return or get worse.  This information is not intended to replace advice given to you by your health care provider. Make sure you discuss any questions you have with your  health care provider.  Document Released: 05/31/2001 Document Revised: 03/02/2017 Document Reviewed: 03/02/2017  [employer]vier Interactive Patient Education  2019 [employer]

## 2018-10-02 ENCOUNTER — Other Ambulatory Visit: Payer: Self-pay | Admitting: Physician Assistant

## 2018-10-02 DIAGNOSIS — J4520 Mild intermittent asthma, uncomplicated: Secondary | ICD-10-CM

## 2018-10-13 ENCOUNTER — Other Ambulatory Visit: Payer: Self-pay | Admitting: Physician Assistant

## 2018-10-13 DIAGNOSIS — F411 Generalized anxiety disorder: Secondary | ICD-10-CM

## 2018-10-13 DIAGNOSIS — F3341 Major depressive disorder, recurrent, in partial remission: Secondary | ICD-10-CM

## 2018-12-13 ENCOUNTER — Ambulatory Visit (INDEPENDENT_AMBULATORY_CARE_PROVIDER_SITE_OTHER): Payer: BLUE CROSS/BLUE SHIELD | Admitting: Physician Assistant

## 2018-12-13 ENCOUNTER — Other Ambulatory Visit: Payer: Self-pay

## 2018-12-13 ENCOUNTER — Encounter: Payer: Self-pay | Admitting: Physician Assistant

## 2018-12-13 ENCOUNTER — Telehealth: Payer: Self-pay | Admitting: *Deleted

## 2018-12-13 VITALS — Wt 182.0 lb

## 2018-12-13 DIAGNOSIS — J4530 Mild persistent asthma, uncomplicated: Secondary | ICD-10-CM

## 2018-12-13 DIAGNOSIS — R0981 Nasal congestion: Secondary | ICD-10-CM

## 2018-12-13 DIAGNOSIS — Z20822 Contact with and (suspected) exposure to covid-19: Secondary | ICD-10-CM

## 2018-12-13 DIAGNOSIS — R0602 Shortness of breath: Secondary | ICD-10-CM

## 2018-12-13 MED ORDER — BREO ELLIPTA 100-25 MCG/INH IN AEPB: 1.0000 | INHALATION_SPRAY | Freq: Every day | RESPIRATORY_TRACT | 0 refills | Status: DC

## 2018-12-13 NOTE — Progress Notes (Signed)
Patient: Wendy [name] Female    DOB: 06/27/1975   43 y.o.   MRN: [id number] Visit Date: 12/13/2018  Today's Provider: Mar Daring, PA-C   Chief Complaint  Patient presents with  . Shortness of Breath   Subjective:    I,Joseline E. Rosas,RMA am acting as a Education administrator for [employer], PA-C.  Virtual Visit via Video Note  I connected with Wendy [name] on 12/13/18 at  9:00 AM EDT by a video enabled telemedicine application and verified that I am speaking with the correct person using two identifiers.  Location: Patient: home Provider: [facility]   I discussed the limitations of evaluation and management by telemedicine and the availability of in person appointments. The patient expressed understanding and agreed to proceed.  Shortness of Breath This is a new problem. The current episode started in the past 7 days. The problem occurs daily. Associated symptoms include rhinorrhea. Pertinent negatives include no abdominal pain, chest pain, fever, sore throat or wheezing. Nothing aggravates the symptoms. Treatments tried: inhaler, and anxiety medication,Allergy medications. The treatment provided mild relief.    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 She does wear a mask at work and has been trying to stay safe but over the last week has noticed increased SOB. She does have mild history of asthma but has not had issues in a long time. She does have an albuterol inhaler on hand for acute issues and has had to use this more frequently. She has also had some nasal congestion and sinus pressure that she equated to allergies. This has all been worsening over the last week. She reports yesterday was the worst day, having to cancel her afternoon appointments and come home from work. She denies fever or cough.    Allergies  Allergen Reactions  . Peanuts [Peanut Oil] Shortness Of Breath  . Banana Itching  . Other Other (See Comments)    Walnuts- makes tongue blister   . Codeine Nausea And Vomiting  . Eggs Or Egg-Derived Products Nausea And Vomiting  . Vicodin [Hydrocodone-Acetaminophen] Nausea And Vomiting     Current Outpatient Medications:  .  ALPRAZolam (XANAX) 0.5 MG tablet, TAKE 1/2 TO 1 TABLET TWICE A DAY AS NEEDED FOR ANXIETY, Disp: 60 tablet, Rfl: 5 .  Cholecalciferol (VITAMIN D PO), Take by mouth., Disp: , Rfl:  .  Multiple Vitamin (MULTI VITAMIN DAILY PO), Take by mouth., Disp: , Rfl:  .  PROAIR HFA 108 (90 Base) MCG/ACT inhaler, INHALE 2 PUFFS INTO THE LUNGS EVERY 4-6 HOURS AS NEEDED, Disp: 18 g, Rfl: 1 .  vortioxetine HBr (TRINTELLIX) 5 MG TABS tablet, Take 1 tablet (5 mg total) by mouth daily., Disp: 90 tablet, Rfl: 1 .  MAGNESIUM CARBONATE PO, Take by mouth., Disp: , Rfl:  .  Melatonin 5 MG CHEW, Chew by mouth., Disp: , Rfl:  .  ondansetron (ZOFRAN) 4 MG tablet, Take 1 tablet (4 mg total) by mouth every 8 (eight) hours as needed for nausea or vomiting. (Patient not taking: Reported on 12/13/2018), Disp: 20 tablet, Rfl: 0  Review of Systems  Constitutional: Negative for fever.  HENT: Positive for congestion, rhinorrhea and sinus pressure. Negative for sore throat.   Respiratory: Positive for chest tightness and shortness of breath. Negative for cough and wheezing.   Cardiovascular: Negative for chest pain and palpitations.  Gastrointestinal: Negative for abdominal pain and nausea.  Neurological: Negative.     Social History   Tobacco  Use  . Smoking status: Never Smoker  . Smokeless tobacco: Never Used  Substance Use Topics  . Alcohol use: Yes    Comment: occasional/rare      Objective:   Wt 182 lb (82.6 kg)   BMI 31.49 kg/m  Vitals:   12/13/18 0828  Weight: 182 lb (82.6 kg)     Physical Exam Vitals signs reviewed.  Constitutional:      General: She is not in acute distress.    Appearance: She is well-developed. She is not ill-appearing.  HENT:     Head: Normocephalic and atraumatic.  Neck:     Musculoskeletal:  Normal range of motion and neck supple.  Pulmonary:     Effort: Pulmonary effort is normal. No respiratory distress.  Neurological:     Mental Status: She is alert.  Psychiatric:        Behavior: Behavior normal.        Thought Content: Thought content normal.        Judgment: Judgment normal.      No results found for any visits on 12/13/18.     Assessment & Plan     1. Mild persistent asthma without complication Will send her for Covid 19 testing since she works with the public and has had acute onset of symptoms of SOB and nasal congestion over the last week. Will also send in [facility] for her to try daily as it may just be exacerbation from wearing a 3 layered mask. Call if not improving.  - fluticasone furoate-vilanterol (BREO ELLIPTA) 100-25 MCG/INH AEPB; Inhale 1 puff into the lungs daily.  Dispense: 60 each; Refill: 0  2. SOB (shortness of breath) Massage therapist with unknown contact. Worsening SOB and nasal congestion over the last week. No fever or cough. Will refer for Covid testing.   3. Nasal congestion See above medical treatment plan.  I discussed the assessment and treatment plan with the patient. The patient was provided an opportunity to ask questions and all were answered. The patient agreed with the plan and demonstrated an understanding of the instructions.   The patient was advised to call back or seek an in-person evaluation if the symptoms worsen or if the condition fails to improve as anticipated.  I provided 18 minutes of non-face-to-face time during this encounter.    Mar Daring, PA-C  [facility]

## 2018-12-13 NOTE — Telephone Encounter (Signed)
Pt scheduled today for covid testing @ 12:15 @ [facility]. Instructions given and order placed

## 2018-12-13 NOTE — Patient Instructions (Signed)
This information is directly available on the CDC website: https://www.cdc.gov/coronavirus/2019-ncov/if-you-are-sick/steps-when-sick.html    Source:CDC Reference to specific commercial products, manufacturers, companies, or trademarks does not constitute its endorsement or recommendation by the U.S. Government, Department of Health and Human Services, or Centers for Disease Control and Prevention.  

## 2018-12-17 LAB — NOVEL CORONAVIRUS, NAA: SARS-CoV-2, NAA: NOT DETECTED

## 2018-12-19 ENCOUNTER — Other Ambulatory Visit: Payer: Self-pay | Admitting: Physician Assistant

## 2019-02-23 ENCOUNTER — Other Ambulatory Visit: Payer: Self-pay | Admitting: Physician Assistant

## 2019-02-23 ENCOUNTER — Encounter: Payer: Self-pay | Admitting: Physician Assistant

## 2019-02-23 DIAGNOSIS — F3341 Major depressive disorder, recurrent, in partial remission: Secondary | ICD-10-CM

## 2019-02-26 ENCOUNTER — Telehealth: Payer: Self-pay

## 2019-02-26 DIAGNOSIS — F411 Generalized anxiety disorder: Secondary | ICD-10-CM

## 2019-02-26 MED ORDER — VORTIOXETINE HBR 10 MG PO TABS: 10.0000 mg | ORAL_TABLET | Freq: Every day | ORAL | 3 refills | Status: DC

## 2019-02-26 NOTE — Telephone Encounter (Signed)
Patients mom was advised.

## 2019-02-26 NOTE — Telephone Encounter (Signed)
Patient's mother is requesting a refill on TRINTELLIX 10 MG TABS tablet. Patient has increase dose from 5 mg to 10 mg. Mother is requesting a call back when RX has been sent.  CB#[phone] Pharmacy- [facility] [address]

## 2019-02-26 NOTE — Telephone Encounter (Signed)
Sent in for her

## 2019-02-26 NOTE — Telephone Encounter (Signed)
Please advise refill? 

## 2019-03-13 NOTE — Progress Notes (Signed)
Patient: Wendy [name], Female    DOB: 19-Jan-1976, 43 y.o.   MRN: [id number] Visit Date: 03/14/2019  Today's Provider: Mar Daring, PA-C   Chief Complaint  Patient presents with  . Annual Exam   Subjective:    I,Joseline E. Rosas,RMA am acting as a Education administrator for [employer], PA-C.   Annual physical exam Wendy [name] is a 43 y.o. female who presents today for health maintenance and complete physical. She feels well. She reports exercising. She reports she is sleeping well. ----------------------------------------------------------------- 01/30/2018 -Pap is normal, HPV negative. Will repeat in 3-5 years. Mammogram:06/05/18 Normal  Review of Systems  Constitutional: Negative.   HENT: Negative.   Eyes: Negative.   Respiratory: Negative.   Cardiovascular: Negative.   Gastrointestinal: Negative.   Endocrine: Negative.   Genitourinary: Negative.   Musculoskeletal: Negative.   Skin: Negative.   Allergic/Immunologic: Positive for environmental allergies and food allergies.  Neurological: Positive for headaches.  Hematological: Negative.   Psychiatric/Behavioral: Negative.     Social History      She  reports that she has never smoked. She has never used smokeless tobacco. She reports current alcohol use. She reports that she does not use drugs.       Social History   Socioeconomic History  . Marital status: Married    Spouse name: Not on file  . Number of children: 2  . Years of education: Not on file  . Highest education level: Not on file  Occupational History  . Not on file  Social Needs  . Financial resource strain: Not on file  . Food insecurity    Worry: Not on file    Inability: Not on file  . Transportation needs    Medical: Not on file    Non-medical: Not on file  Tobacco Use  . Smoking status: Never Smoker  . Smokeless tobacco: Never Used  Substance and Sexual Activity  . Alcohol use: Yes    Comment: occasional/rare  . Drug use: No   . Sexual activity: Yes    Partners: Male    Birth control/protection: Surgical    Comment: Tubaligation  Lifestyle  . Physical activity    Days per week: Not on file    Minutes per session: Not on file  . Stress: Not on file  Relationships  . Social Herbalist on phone: Not on file    Gets together: Not on file    Attends religious service: Not on file    Active member of club or organization: Not on file    Attends meetings of clubs or organizations: Not on file    Relationship status: Not on file  Other Topics Concern  . Not on file  Social History Narrative  . Not on file    Past Medical History:  Diagnosis Date  . Abnormal Pap smear of cervix 10/2011  . Allergy   . Anxiety   . Depression   . Heart murmur    slight     Patient Active Problem List   Diagnosis Date Noted  . GAD (generalized anxiety disorder) 11/05/2015  . ADHD (attention deficit hyperactivity disorder) 11/05/2015  . Obese 11/05/2015  . Urinary incontinence in female 11/05/2015  . Constipation 11/05/2015  . Menstrual migraine 11/05/2015  . Contraception management 05/02/2012  . Depression 02/13/2012  . Abnormal Pap smear of cervix 10/19/2011    Past Surgical History:  Procedure Laterality Date  . CESAREAN SECTION  2004  x1  . LAPAROSCOPIC TUBAL LIGATION  05/02/2012   Procedure: LAPAROSCOPIC TUBAL LIGATION;  Surgeon: Emily Filbert, MD;  Location: [facility];  Service: Gynecology;  Laterality: Bilateral;  with filshie clips  . TUBAL LIGATION  04/2012  . WISDOM TOOTH EXTRACTION     x4  . WRIST SURGERY     rigth cyst removal.    Family History        Family Status  Relation Name Status  . Mother  Alive  . Father  Alive  . MGM  Alive  . MGF  Deceased  . PGM  Alive  . PGF  Deceased  . Daughter  Alive  . Daughter  Alive  . Neg Hx  (Not Specified)        Her family history includes Alcohol abuse in her father; Arthritis in her mother; Asthma in her paternal grandmother;  Emphysema in her maternal grandfather; Heart disease in her father, maternal grandfather, maternal grandmother, and paternal grandfather; Hypothyroidism in her mother. There is no history of Breast cancer.      Allergies  Allergen Reactions  . Peanuts [Peanut Oil] Shortness Of Breath  . Banana Itching  . Other Other (See Comments)    Walnuts- makes tongue blister  . Codeine Nausea And Vomiting  . Eggs Or Egg-Derived Products Nausea And Vomiting  . Vicodin [Hydrocodone-Acetaminophen] Nausea And Vomiting     Current Outpatient Medications:  .  ALPRAZolam (XANAX) 0.5 MG tablet, TAKE 1/2 TO 1 TABLET TWICE A DAY AS NEEDED FOR ANXIETY, Disp: 60 tablet, Rfl: 5 .  Cholecalciferol (VITAMIN D PO), Take by mouth., Disp: , Rfl:  .  fluticasone furoate-vilanterol (BREO ELLIPTA) 100-25 MCG/INH AEPB, Inhale 1 puff into the lungs daily., Disp: 60 each, Rfl: 0 .  Multiple Vitamin (MULTI VITAMIN DAILY PO), Take by mouth., Disp: , Rfl:  .  PROAIR HFA 108 (90 Base) MCG/ACT inhaler, INHALE 2 PUFFS INTO THE LUNGS EVERY 4-6 HOURS AS NEEDED, Disp: 18 g, Rfl: 1 .  vortioxetine HBr (TRINTELLIX) 10 MG TABS tablet, Take 1 tablet (10 mg total) by mouth daily., Disp: 90 tablet, Rfl: 3 .  MAGNESIUM CARBONATE PO, Take by mouth., Disp: , Rfl:  .  Melatonin 5 MG CHEW, Chew by mouth., Disp: , Rfl:  .  ondansetron (ZOFRAN) 4 MG tablet, Take 1 tablet (4 mg total) by mouth every 8 (eight) hours as needed for nausea or vomiting. (Patient not taking: Reported on 12/13/2018), Disp: 20 tablet, Rfl: 0   Patient Care Team: Mar Daring, PA-C as PCP - General (Family Medicine)    Objective:    Vitals: BP 107/73 (BP Location: Left Arm, Patient Position: Sitting, Cuff Size: Large)   Pulse 92   Temp (!) 97.3 F (36.3 C) (Other (Comment))   Resp 16   Ht 5\' 3"  (1.6 m)   Wt 193 lb (87.5 kg)   BMI 34.19 kg/m    Vitals:   03/14/19 1415  BP: 107/73  Pulse: 92  Resp: 16  Temp: (!) 97.3 F (36.3 C)  TempSrc: Other  (Comment)  Weight: 193 lb (87.5 kg)  Height: 5\' 3"  (1.6 m)     Physical Exam Vitals signs reviewed.  Constitutional:      General: She is not in acute distress.    Appearance: Normal appearance. She is well-developed. She is not ill-appearing or diaphoretic.  HENT:     Head: Normocephalic and atraumatic.     Right Ear: Hearing, tympanic membrane, ear canal and external  ear normal.     Left Ear: Hearing, tympanic membrane, ear canal and external ear normal.     Nose: Nose normal.     Mouth/Throat:     Mouth: Mucous membranes are moist.     Pharynx: Oropharynx is clear. Uvula midline. No oropharyngeal exudate.  Eyes:     General: No scleral icterus.       Right eye: No discharge.        Left eye: No discharge.     Extraocular Movements: Extraocular movements intact.     Conjunctiva/sclera: Conjunctivae normal.     Pupils: Pupils are equal, round, and reactive to light.  Neck:     Musculoskeletal: Normal range of motion and neck supple.     Thyroid: No thyromegaly.     Vascular: No JVD.     Trachea: No tracheal deviation.  Cardiovascular:     Rate and Rhythm: Normal rate and regular rhythm.     Pulses: Normal pulses.     Heart sounds: Normal heart sounds. No murmur. No friction rub. No gallop.   Pulmonary:     Effort: Pulmonary effort is normal. No respiratory distress.     Breath sounds: Normal breath sounds. No wheezing or rales.  Chest:     Chest wall: No tenderness.     Breasts: Breasts are symmetrical.        Right: No inverted nipple, mass, nipple discharge, skin change or tenderness.        Left: No inverted nipple, mass, nipple discharge, skin change or tenderness.  Abdominal:     General: Abdomen is flat. Bowel sounds are normal. There is no distension.     Palpations: Abdomen is soft. There is no mass.     Tenderness: There is no abdominal tenderness. There is no guarding or rebound.     Hernia: There is no hernia in the left inguinal area.  Genitourinary:     Exam position: Supine.     Labia:        Right: No rash, tenderness, lesion or injury.        Left: No rash, tenderness, lesion or injury.      Vagina: Normal. No signs of injury. No vaginal discharge, erythema, tenderness or bleeding.     Cervix: No cervical motion tenderness, discharge or friability.     Adnexa:        Right: No mass, tenderness or fullness.         Left: No mass, tenderness or fullness.       Rectum: Normal.     Comments: Small cystocele noted Musculoskeletal: Normal range of motion.        General: No tenderness.  Lymphadenopathy:     Cervical: No cervical adenopathy.  Skin:    General: Skin is warm and dry.     Capillary Refill: Capillary refill takes less than 2 seconds.     Findings: No rash.  Neurological:     General: No focal deficit present.     Mental Status: She is alert and oriented to person, place, and time. Mental status is at baseline.     Cranial Nerves: No cranial nerve deficit.     Coordination: Coordination normal.     Deep Tendon Reflexes: Reflexes are normal and symmetric.  Psychiatric:        Mood and Affect: Mood normal.        Behavior: Behavior normal.        Thought Content: Thought content normal.  Judgment: Judgment normal.      Depression Screen PHQ 2/9 Scores 03/14/2019 01/25/2018 08/29/2017 11/18/2016  PHQ - 2 Score 1 2 6  0  PHQ- 9 Score 10 9 15  0       Assessment & Plan:     Routine Health Maintenance and Physical Exam  Exercise Activities and Dietary recommendations Goals   None     Immunization History  Administered Date(s) Administered  . Tdap 11/18/2016    Health Maintenance  Topic Date Due  . INFLUENZA VACCINE  01/19/2019  . PAP SMEAR-Modifier  01/26/2019  . TETANUS/TDAP  11/19/2026  . HIV Screening  Completed     Discussed health benefits of physical activity, and encouraged her to engage in regular exercise appropriate for her age and condition.    1. Annual physical exam Normal physical  exam today. Will check labs as below and f/u pending lab results. If labs are stable and WNL she will not need to have these rechecked for one year at her next annual physical exam. She is to call the office in the meantime if she has any acute issue, questions or concerns. - CBC with Differential/Platelet - Comprehensive metabolic panel - Lipid panel - TSH  2. Breast cancer screening Breast exam today was normal. There is no family history of breast cancer. She does perform regular self breast exams. Mammogram was ordered as below. Information for [facility] was given to patient so she may schedule her mammogram at her convenience. - MM 3D SCREEN BREAST BILATERAL; Future  3. Cervical cancer screening Pap collected today. Will send as below and f/u pending results. - Cytology - PAP  4. Need for influenza vaccination Flu vaccine given today without complication. Patient sat upright for 15 minutes to check for adverse reaction before being released. - Flu Vaccine QUAD 36+ mos IM  5. Female cystocele Discussed options. Since she is young we discussed trying pelvic floor PT as this may help before it becomes a more severe issue.  - Ambulatory referral to Physical Therapy  --------------------------------------------------------------------    Mar Daring, PA-C  [facility]

## 2019-03-14 ENCOUNTER — Other Ambulatory Visit: Payer: Self-pay

## 2019-03-14 ENCOUNTER — Encounter: Payer: Self-pay | Admitting: Physician Assistant

## 2019-03-14 ENCOUNTER — Other Ambulatory Visit (HOSPITAL_COMMUNITY): Admission: RE | Admit: 2019-03-14 | Payer: BLUE CROSS/BLUE SHIELD | Source: Ambulatory Visit

## 2019-03-14 ENCOUNTER — Ambulatory Visit (INDEPENDENT_AMBULATORY_CARE_PROVIDER_SITE_OTHER): Payer: BLUE CROSS/BLUE SHIELD | Admitting: Physician Assistant

## 2019-03-14 VITALS — BP 107/73 | HR 92 | Temp 97.3°F | Resp 16 | Ht 63.0 in | Wt 193.0 lb

## 2019-03-14 DIAGNOSIS — N8110 Cystocele, unspecified: Secondary | ICD-10-CM

## 2019-03-14 DIAGNOSIS — Z0000 Encounter for general adult medical examination without abnormal findings: Secondary | ICD-10-CM | POA: Diagnosis not present

## 2019-03-14 DIAGNOSIS — Z23 Encounter for immunization: Secondary | ICD-10-CM

## 2019-03-14 DIAGNOSIS — Z124 Encounter for screening for malignant neoplasm of cervix: Secondary | ICD-10-CM

## 2019-03-14 DIAGNOSIS — Z1239 Encounter for other screening for malignant neoplasm of breast: Secondary | ICD-10-CM

## 2019-03-14 NOTE — Patient Instructions (Signed)
Health Maintenance, Female Adopting a healthy lifestyle and getting preventive care are important in promoting health and wellness. Ask your health care provider about:  The right schedule for you to have regular tests and exams.  Things you can do on your own to prevent diseases and keep yourself healthy. What should I know about diet, weight, and exercise? Eat a healthy diet   Eat a diet that includes plenty of vegetables, fruits, low-fat dairy products, and lean protein.  Do not eat a lot of foods that are high in solid fats, added sugars, or sodium. Maintain a healthy weight Body mass index (BMI) is used to identify weight problems. It estimates body fat based on height and weight. Your health care provider can help determine your BMI and help you achieve or maintain a healthy weight. Get regular exercise Get regular exercise. This is one of the most important things you can do for your health. Most adults should:  Exercise for at least 150 minutes each week. The exercise should increase your heart rate and make you sweat (moderate-intensity exercise).  Do strengthening exercises at least twice a week. This is in addition to the moderate-intensity exercise.  Spend less time sitting. Even light physical activity can be beneficial. Watch cholesterol and blood lipids Have your blood tested for lipids and cholesterol at 43 years of age, then have this test every 5 years. Have your cholesterol levels checked more often if:  Your lipid or cholesterol levels are high.  You are older than 43 years of age.  You are at high risk for heart disease. What should I know about cancer screening? Depending on your health history and family history, you may need to have cancer screening at various ages. This may include screening for:  Breast cancer.  Cervical cancer.  Colorectal cancer.  Skin cancer.  Lung cancer. What should I know about heart disease, diabetes, and high blood  pressure? Blood pressure and heart disease  High blood pressure causes heart disease and increases the risk of stroke. This is more likely to develop in people who have high blood pressure readings, are of African descent, or are overweight.  Have your blood pressure checked: ? Every 3-5 years if you are 18-39 years of age. ? Every year if you are 40 years old or older. Diabetes Have regular diabetes screenings. This checks your fasting blood sugar level. Have the screening done:  Once every three years after age 40 if you are at a normal weight and have a low risk for diabetes.  More often and at a younger age if you are overweight or have a high risk for diabetes. What should I know about preventing infection? Hepatitis B If you have a higher risk for hepatitis B, you should be screened for this virus. Talk with your health care provider to find out if you are at risk for hepatitis B infection. Hepatitis C Testing is recommended for:  Everyone born from 1945 through 1965.  Anyone with known risk factors for hepatitis C. Sexually transmitted infections (STIs)  Get screened for STIs, including gonorrhea and chlamydia, if: ? You are sexually active and are younger than 43 years of age. ? You are older than 43 years of age and your health care provider tells you that you are at risk for this type of infection. ? Your sexual activity has changed since you were last screened, and you are at increased risk for chlamydia or gonorrhea. Ask your health care provider if   you are at risk.  Ask your health care provider about whether you are at high risk for HIV. Your health care provider may recommend a prescription medicine to help prevent HIV infection. If you choose to take medicine to prevent HIV, you should first get tested for HIV. You should then be tested every 3 months for as long as you are taking the medicine. Pregnancy  If you are about to stop having your period (premenopausal) and  you may become pregnant, seek counseling before you get pregnant.  Take 400 to 800 micrograms (mcg) of folic acid every day if you become pregnant.  Ask for birth control (contraception) if you want to prevent pregnancy. Osteoporosis and menopause Osteoporosis is a disease in which the bones lose minerals and strength with aging. This can result in bone fractures. If you are 65 years old or older, or if you are at risk for osteoporosis and fractures, ask your health care provider if you should:  Be screened for bone loss.  Take a calcium or vitamin D supplement to lower your risk of fractures.  Be given hormone replacement therapy (HRT) to treat symptoms of menopause. Follow these instructions at home: Lifestyle  Do not use any products that contain nicotine or tobacco, such as cigarettes, e-cigarettes, and chewing tobacco. If you need help quitting, ask your health care provider.  Do not use street drugs.  Do not share needles.  Ask your health care provider for help if you need support or information about quitting drugs. Alcohol use  Do not drink alcohol if: ? Your health care provider tells you not to drink. ? You are pregnant, may be pregnant, or are planning to become pregnant.  If you drink alcohol: ? Limit how much you use to 0-1 drink a day. ? Limit intake if you are breastfeeding.  Be aware of how much alcohol is in your drink. In the [place] one drink equals one 12 oz bottle of beer (355 mL), one 5 oz glass of wine (148 mL), or one 1 oz glass of hard liquor (44 mL). General instructions  Schedule regular health, dental, and eye exams.  Stay current with your vaccines.  Tell your health care provider if: ? You often feel depressed. ? You have ever been abused or do not feel safe at home. Summary  Adopting a healthy lifestyle and getting preventive care are important in promoting health and wellness.  Follow your health care provider's instructions about healthy  diet, exercising, and getting tested or screened for diseases.  Follow your health care provider's instructions on monitoring your cholesterol and blood pressure. This information is not intended to replace advice given to you by your health care provider. Make sure you discuss any questions you have with your health care provider. Document Released: 12/20/2010 Document Revised: 05/30/2018 Document Reviewed: 05/30/2018 Elsevier Patient Education  2020 [employer] Inc[employer]  

## 2019-03-15 LAB — CBC WITH DIFFERENTIAL/PLATELET
Basophils Absolute: 0 10*3/uL (ref 0.0–0.2)
Basos: 0 %
EOS (ABSOLUTE): 0.1 10*3/uL (ref 0.0–0.4)
Eos: 1 %
Hematocrit: 38.8 % (ref 34.0–46.6)
Hemoglobin: 13.1 g/dL (ref 11.1–15.9)
Immature Grans (Abs): 0 10*3/uL (ref 0.0–0.1)
Immature Granulocytes: 0 %
Lymphocytes Absolute: 2.1 10*3/uL (ref 0.7–3.1)
Lymphs: 23 %
MCH: 31.3 pg (ref 26.6–33.0)
MCHC: 33.8 g/dL (ref 31.5–35.7)
MCV: 93 fL (ref 79–97)
Monocytes Absolute: 0.9 10*3/uL (ref 0.1–0.9)
Monocytes: 10 %
Neutrophils Absolute: 5.8 10*3/uL (ref 1.4–7.0)
Neutrophils: 66 %
Platelets: 232 10*3/uL (ref 150–450)
RBC: 4.18 x10E6/uL (ref 3.77–5.28)
RDW: 13 % (ref 11.7–15.4)
WBC: 8.9 10*3/uL (ref 3.4–10.8)

## 2019-03-15 LAB — LIPID PANEL
Chol/HDL Ratio: 1.9 ratio (ref 0.0–4.4)
Cholesterol, Total: 181 mg/dL (ref 100–199)
HDL: 97 mg/dL (ref >39)
LDL Chol Calc (NIH): 75 mg/dL (ref 0–99)
Triglycerides: 43 mg/dL (ref 0–149)
VLDL Cholesterol Cal: 9 mg/dL (ref 5–40)

## 2019-03-15 LAB — TSH: TSH: 0.984 u[IU]/mL (ref 0.450–4.500)

## 2019-03-15 LAB — COMPREHENSIVE METABOLIC PANEL
ALT: 14 IU/L (ref 0–32)
AST: 18 IU/L (ref 0–40)
Albumin/Globulin Ratio: 2.2 (ref 1.2–2.2)
Albumin: 4.3 g/dL (ref 3.8–4.8)
Alkaline Phosphatase: 80 IU/L (ref 39–117)
BUN/Creatinine Ratio: 22 (ref 9–23)
BUN: 16 mg/dL (ref 6–24)
Bilirubin Total: 0.3 mg/dL (ref 0.0–1.2)
CO2: 23 mmol/L (ref 20–29)
Calcium: 9.8 mg/dL (ref 8.7–10.2)
Chloride: 103 mmol/L (ref 96–106)
Creatinine, Ser: 0.74 mg/dL (ref 0.57–1.00)
GFR calc Af Amer: 115 mL/min/{1.73_m2} (ref >59)
GFR calc non Af Amer: 100 mL/min/{1.73_m2} (ref >59)
Globulin, Total: 2 g/dL (ref 1.5–4.5)
Glucose: 95 mg/dL (ref 65–99)
Potassium: 4.3 mmol/L (ref 3.5–5.2)
Sodium: 140 mmol/L (ref 134–144)
Total Protein: 6.3 g/dL (ref 6.0–8.5)

## 2019-03-19 LAB — CYTOLOGY - PAP
Diagnosis: NEGATIVE
High risk HPV: NEGATIVE
Molecular Disclaimer: 56
Molecular Disclaimer: NORMAL

## 2019-03-20 ENCOUNTER — Telehealth: Payer: Self-pay

## 2019-03-20 NOTE — Telephone Encounter (Signed)
LMTCB 03/20/2019  Thanks,   -Mickel Baas

## 2019-03-20 NOTE — Telephone Encounter (Signed)
Viewed by Paulita Cradle on 03/20/2019 8:58 AM

## 2019-03-20 NOTE — Telephone Encounter (Signed)
-----   Message from Mar Daring, [place] sent at 03/20/2019  8:39 AM EDT ----- Pap is normal, HPV negative.  Will repeat in 3-5 years.

## 2019-03-31 ENCOUNTER — Encounter: Payer: Self-pay | Admitting: Physician Assistant

## 2019-04-04 NOTE — Progress Notes (Signed)
Patient: Wendy [name] Female    DOB: 07/18/1975   43 y.o.   MRN: [id number] Visit Date: 04/05/2019  Today's Provider: Mar Daring, PA-C   Chief Complaint  Patient presents with  . Vaginal Discharge   Subjective:     Vaginal Discharge The patient's primary symptoms include a genital odor and vaginal discharge. This is a recurrent problem. The current episode started in the past 7 days. The problem occurs daily. The problem has been unchanged. The patient is experiencing no pain. The vaginal discharge was white. She is sexually active.  new partner  Allergies  Allergen Reactions  . Peanuts [Peanut Oil] Shortness Of Breath  . Banana Itching  . Other Other (See Comments)    Walnuts- makes tongue blister  . Codeine Nausea And Vomiting  . Eggs Or Egg-Derived Products Nausea And Vomiting  . Vicodin [Hydrocodone-Acetaminophen] Nausea And Vomiting     Current Outpatient Medications:  .  ALPRAZolam (XANAX) 0.5 MG tablet, TAKE 1/2 TO 1 TABLET TWICE A DAY AS NEEDED FOR ANXIETY, Disp: 60 tablet, Rfl: 5 .  Cholecalciferol (VITAMIN D PO), Take by mouth., Disp: , Rfl:  .  fluticasone furoate-vilanterol (BREO ELLIPTA) 100-25 MCG/INH AEPB, Inhale 1 puff into the lungs daily., Disp: 60 each, Rfl: 0 .  MAGNESIUM CARBONATE PO, Take by mouth., Disp: , Rfl:  .  Multiple Vitamin (MULTI VITAMIN DAILY PO), Take by mouth., Disp: , Rfl:  .  PROAIR HFA 108 (90 Base) MCG/ACT inhaler, INHALE 2 PUFFS INTO THE LUNGS EVERY 4-6 HOURS AS NEEDED, Disp: 18 g, Rfl: 1 .  tretinoin (RETIN-A) 0.1 % cream, , Disp: , Rfl:  .  vortioxetine HBr (TRINTELLIX) 10 MG TABS tablet, Take 1 tablet (10 mg total) by mouth daily., Disp: 90 tablet, Rfl: 3  Review of Systems  Constitutional: Negative.   Respiratory: Negative.   Cardiovascular: Negative.   Gastrointestinal: Negative.   Genitourinary: Positive for vaginal discharge.  Neurological: Negative.     Social History   Tobacco Use  . Smoking status:  Never Smoker  . Smokeless tobacco: Never Used  Substance Use Topics  . Alcohol use: Yes    Comment: occasional/rare      Objective:   BP 100/70   Pulse 90   Temp (!) 97.1 F (36.2 C) (Temporal)   Resp 18   Ht 5\' 3"  (1.6 m)   Wt 190 lb (86.2 kg)   LMP 03/18/2019 (Approximate)   SpO2 98%   BMI 33.66 kg/m  Vitals:   04/05/19 0846  BP: 100/70  Pulse: 90  Resp: 18  Temp: (!) 97.1 F (36.2 C)  TempSrc: Temporal  SpO2: 98%  Weight: 190 lb (86.2 kg)  Height: 5\' 3"  (1.6 m)  Body mass index is 33.66 kg/m.   Physical Exam Vitals signs reviewed.  Constitutional:      General: She is not in acute distress.    Appearance: Normal appearance. She is well-developed. She is not ill-appearing or diaphoretic.  Neck:     Musculoskeletal: Normal range of motion and neck supple.     Thyroid: No thyromegaly.     Vascular: No JVD.     Trachea: No tracheal deviation.  Cardiovascular:     Rate and Rhythm: Normal rate and regular rhythm.     Heart sounds: Normal heart sounds. No murmur. No friction rub. No gallop.   Pulmonary:     Effort: Pulmonary effort is normal. No respiratory distress.     Breath  sounds: Normal breath sounds. No wheezing or rales.  Genitourinary:    General: Normal vulva.     Exam position: Supine.     Labia:        Right: No rash, tenderness, lesion or injury.        Left: No rash, tenderness, lesion or injury.      Urethra: No prolapse or urethral swelling.     Vagina: Vaginal discharge (thin, white, milky discharge noted) present. No erythema, tenderness or bleeding.     Cervix: Normal.     Uterus: Normal.      Adnexa: Right adnexa normal and left adnexa normal.  Lymphadenopathy:     Cervical: No cervical adenopathy.  Neurological:     Mental Status: She is alert.      No results found for any visits on 04/05/19.     Assessment & Plan    1. Vaginal odor Swab collected today but suspect BV. Will treat empirically as below. Will f/u pending lab  results.  - Cervicovaginal ancillary only  2. BV (bacterial vaginosis) Metronidazole given as below for possible BV.  - metroNIDAZOLE (FLAGYL) 500 MG tablet; Take 1 tablet (500 mg total) by mouth 2 (two) times daily.  Dispense: 14 tablet; Refill: 0  3. Screen for STD (sexually transmitted disease) Will check labs as below and f/u pending results. - Cervicovaginal ancillary only - HIV antibody (with reflex) - RPR     Mar Daring, PA-C  [facility]

## 2019-04-05 ENCOUNTER — Ambulatory Visit (INDEPENDENT_AMBULATORY_CARE_PROVIDER_SITE_OTHER): Payer: BLUE CROSS/BLUE SHIELD | Admitting: Physician Assistant

## 2019-04-05 ENCOUNTER — Encounter: Payer: Self-pay | Admitting: Physician Assistant

## 2019-04-05 ENCOUNTER — Other Ambulatory Visit (HOSPITAL_COMMUNITY): Admission: RE | Admit: 2019-04-05 | Payer: BLUE CROSS/BLUE SHIELD | Source: Ambulatory Visit

## 2019-04-05 ENCOUNTER — Other Ambulatory Visit: Payer: Self-pay

## 2019-04-05 VITALS — BP 100/70 | HR 90 | Temp 97.1°F | Resp 18 | Ht 63.0 in | Wt 190.0 lb

## 2019-04-05 DIAGNOSIS — N898 Other specified noninflammatory disorders of vagina: Secondary | ICD-10-CM

## 2019-04-05 DIAGNOSIS — Z113 Encounter for screening for infections with a predominantly sexual mode of transmission: Secondary | ICD-10-CM | POA: Diagnosis not present

## 2019-04-05 DIAGNOSIS — B9689 Other specified bacterial agents as the cause of diseases classified elsewhere: Secondary | ICD-10-CM | POA: Diagnosis not present

## 2019-04-05 DIAGNOSIS — N760 Acute vaginitis: Secondary | ICD-10-CM | POA: Diagnosis not present

## 2019-04-05 MED ORDER — METRONIDAZOLE 500 MG PO TABS: 500.0000 mg | ORAL_TABLET | Freq: Two times a day (BID) | ORAL | 0 refills | Status: DC

## 2019-04-05 NOTE — Patient Instructions (Signed)
Bacterial Vaginosis  Bacterial vaginosis is a vaginal infection that occurs when the normal balance of bacteria in the vagina is disrupted. It results from an overgrowth of certain bacteria. This is the most common vaginal infection among women ages 15-44. Because bacterial vaginosis increases your risk for STIs (sexually transmitted infections), getting treated can help reduce your risk for chlamydia, gonorrhea, herpes, and HIV (human immunodeficiency virus). Treatment is also important for preventing complications in pregnant women, because this condition can cause an early (premature) delivery. What are the causes? This condition is caused by an increase in harmful bacteria that are normally present in small amounts in the vagina. However, the reason that the condition develops is not fully understood. What increases the risk? The following factors may make you more likely to develop this condition:  Having a new sexual partner or multiple sexual partners.  Having unprotected sex.  Douching.  Having an intrauterine device (IUD).  Smoking.  Drug and alcohol abuse.  Taking certain antibiotic medicines.  Being pregnant. You cannot get bacterial vaginosis from toilet seats, bedding, swimming pools, or contact with objects around you. What are the signs or symptoms? Symptoms of this condition include:  Grey or white vaginal discharge. The discharge can also be watery or foamy.  A fish-like odor with discharge, especially after sexual intercourse or during menstruation.  Itching in and around the vagina.  Burning or pain with urination. Some women with bacterial vaginosis have no signs or symptoms. How is this diagnosed? This condition is diagnosed based on:  Your medical history.  A physical exam of the vagina.  Testing a sample of vaginal fluid under a microscope to look for a large amount of bad bacteria or abnormal cells. Your health care provider may use a cotton swab or  a small wooden spatula to collect the sample. How is this treated? This condition is treated with antibiotics. These may be given as a pill, a vaginal cream, or a medicine that is put into the vagina (suppository). If the condition comes back after treatment, a second round of antibiotics may be needed. Follow these instructions at home: Medicines  Take over-the-counter and prescription medicines only as told by your health care provider.  Take or use your antibiotic as told by your health care provider. Do not stop taking or using the antibiotic even if you start to feel better. General instructions  If you have a female sexual partner, tell her that you have a vaginal infection. She should see her health care provider and be treated if she has symptoms. If you have a female sexual partner, he does not need treatment.  During treatment: ? Avoid sexual activity until you finish treatment. ? Do not douche. ? Avoid alcohol as directed by your health care provider. ? Avoid breastfeeding as directed by your health care provider.  Drink enough water and fluids to keep your urine clear or pale yellow.  Keep the area around your vagina and rectum clean. ? Wash the area daily with warm water. ? Wipe yourself from front to back after using the toilet.  Keep all follow-up visits as told by your health care provider. This is important. How is this prevented?  Do not douche.  Wash the outside of your vagina with warm water only.  Use protection when having sex. This includes latex condoms and dental dams.  Limit how many sexual partners you have. To help prevent bacterial vaginosis, it is best to have sex with just one partner (  monogamous).  Make sure you and your sexual partner are tested for STIs.  Wear cotton or cotton-lined underwear.  Avoid wearing tight pants and pantyhose, especially during summer.  Limit the amount of alcohol that you drink.  Do not use any products that contain  nicotine or tobacco, such as cigarettes and e-cigarettes. If you need help quitting, ask your health care provider.  Do not use illegal drugs. Where to find more information  Centers for Disease Control and Prevention: www.cdc.gov/std  American Sexual Health Association (ASHA): www.ashastd.org  U.S. Department of Health and Human Services, Office on Women's Health: www.womenshealth.gov/ or https://www.womenshealth.gov/a-z-topics/bacterial-vaginosis Contact a health care provider if:  Your symptoms do not improve, even after treatment.  You have more discharge or pain when urinating.  You have a fever.  You have pain in your abdomen.  You have pain during sex.  You have vaginal bleeding between periods. Summary  Bacterial vaginosis is a vaginal infection that occurs when the normal balance of bacteria in the vagina is disrupted.  Because bacterial vaginosis increases your risk for STIs (sexually transmitted infections), getting treated can help reduce your risk for chlamydia, gonorrhea, herpes, and HIV (human immunodeficiency virus). Treatment is also important for preventing complications in pregnant women, because the condition can cause an early (premature) delivery.  This condition is treated with antibiotic medicines. These may be given as a pill, a vaginal cream, or a medicine that is put into the vagina (suppository). This information is not intended to replace advice given to you by your health care provider. Make sure you discuss any questions you have with your health care provider. Document Released: 06/06/2005 Document Revised: 05/19/2017 Document Reviewed: 02/20/2016 Elsevier Patient Education  2020 [employer]  

## 2019-04-06 LAB — HIV ANTIBODY (ROUTINE TESTING W REFLEX): HIV Screen 4th Generation wRfx: NONREACTIVE

## 2019-04-06 LAB — RPR: RPR Ser Ql: NONREACTIVE

## 2019-04-08 ENCOUNTER — Telehealth: Payer: Self-pay

## 2019-04-08 NOTE — Telephone Encounter (Signed)
lmtcb

## 2019-04-08 NOTE — Telephone Encounter (Signed)
-----   Message from Mar Daring, PA-C sent at 04/08/2019 11:12 AM EDT ----- STD screen (blood) is negative.

## 2019-04-08 NOTE — Telephone Encounter (Signed)
Patient advised as below.  

## 2019-04-10 ENCOUNTER — Telehealth: Payer: Self-pay

## 2019-04-10 LAB — CERVICOVAGINAL ANCILLARY ONLY
Bacterial Vaginitis (gardnerella): POSITIVE — AB
Candida Glabrata: NEGATIVE
Candida Vaginitis: NEGATIVE
Chlamydia: NEGATIVE
Comment: NEGATIVE
Comment: NEGATIVE
Comment: NEGATIVE
Comment: NEGATIVE
Comment: NEGATIVE
Comment: NORMAL
Neisseria Gonorrhea: NEGATIVE
Trichomonas: NEGATIVE

## 2019-04-10 NOTE — Telephone Encounter (Signed)
-----   Message from Mar Daring, [place] sent at 04/10/2019  9:58 AM EDT ----- Swab was negative for everything except BV which you were treated for.

## 2019-04-10 NOTE — Telephone Encounter (Signed)
LMTCB or to view message in My chart

## 2019-04-11 ENCOUNTER — Other Ambulatory Visit: Payer: Self-pay

## 2019-04-11 ENCOUNTER — Ambulatory Visit (INDEPENDENT_AMBULATORY_CARE_PROVIDER_SITE_OTHER): Payer: BLUE CROSS/BLUE SHIELD | Admitting: Family Medicine

## 2019-04-11 ENCOUNTER — Encounter: Payer: Self-pay | Admitting: Family Medicine

## 2019-04-11 VITALS — BP 95/70 | HR 78 | Temp 97.2°F | Wt 192.0 lb

## 2019-04-11 DIAGNOSIS — Z30430 Encounter for insertion of intrauterine contraceptive device: Secondary | ICD-10-CM

## 2019-04-11 DIAGNOSIS — N920 Excessive and frequent menstruation with regular cycle: Secondary | ICD-10-CM | POA: Diagnosis not present

## 2019-04-11 LAB — POCT URINE PREGNANCY: Preg Test, Ur: NEGATIVE

## 2019-04-11 MED ORDER — LEVONORGESTREL 20 MCG/24HR IU IUD
1.0000 | INTRAUTERINE_SYSTEM | Freq: Once | INTRAUTERINE | Status: AC
  Administered 2019-04-11: 1 via INTRAUTERINE

## 2019-04-11 NOTE — Patient Instructions (Signed)

## 2019-04-11 NOTE — Progress Notes (Signed)
Patient: Wendy [name] Female    DOB: 10/08/1975   43 y.o.   MRN: [id number] Visit Date: 04/11/2019  Today's Provider: Lavon Paganini, MD   Chief Complaint  Patient presents with  . Contraception   Subjective:     HPI   Patient is a 43 year old G2, P2 with menorrhagia.  She states that she had 2 Mirena IUDs for total about 9 years after the birth of her daughters.  She did not have any problems with the IUDs.  She had amenorrhea while on the IUD.  She discontinued them because she believed that she needed to be having menstrual cycles.  This was about 5 years ago.  She is now struggling with menorrhagia despite OCPs.  She desires Mirena IUD again if possible.   Allergies  Allergen Reactions  . Peanuts [Peanut Oil] Shortness Of Breath  . Banana Itching  . Other Other (See Comments)    Walnuts- makes tongue blister  . Codeine Nausea And Vomiting  . Eggs Or Egg-Derived Products Nausea And Vomiting  . Vicodin [Hydrocodone-Acetaminophen] Nausea And Vomiting     Current Outpatient Medications:  .  ALPRAZolam (XANAX) 0.5 MG tablet, TAKE 1/2 TO 1 TABLET TWICE A DAY AS NEEDED FOR ANXIETY, Disp: 60 tablet, Rfl: 5 .  Cholecalciferol (VITAMIN D PO), Take by mouth., Disp: , Rfl:  .  fluticasone furoate-vilanterol (BREO ELLIPTA) 100-25 MCG/INH AEPB, Inhale 1 puff into the lungs daily., Disp: 60 each, Rfl: 0 .  MAGNESIUM CARBONATE PO, Take by mouth., Disp: , Rfl:  .  metroNIDAZOLE (FLAGYL) 500 MG tablet, Take 1 tablet (500 mg total) by mouth 2 (two) times daily., Disp: 14 tablet, Rfl: 0 .  Multiple Vitamin (MULTI VITAMIN DAILY PO), Take by mouth., Disp: , Rfl:  .  PROAIR HFA 108 (90 Base) MCG/ACT inhaler, INHALE 2 PUFFS INTO THE LUNGS EVERY 4-6 HOURS AS NEEDED, Disp: 18 g, Rfl: 1 .  tretinoin (RETIN-A) 0.1 % cream, , Disp: , Rfl:  .  vortioxetine HBr (TRINTELLIX) 10 MG TABS tablet, Take 1 tablet (10 mg total) by mouth daily., Disp: 90 tablet, Rfl: 3  Review of Systems   Constitutional: Negative.   Respiratory: Negative.   Cardiovascular: Negative.   Genitourinary: Negative.   Neurological: Negative.   Psychiatric/Behavioral: Negative.     Social History   Tobacco Use  . Smoking status: Never Smoker  . Smokeless tobacco: Never Used  Substance Use Topics  . Alcohol use: Yes    Comment: occasional/rare      Objective:   BP 95/70 (BP Location: Left Arm, Patient Position: Sitting, Cuff Size: Large)   Pulse 78   Temp (!) 97.2 F (36.2 C) (Temporal)   Wt 192 lb (87.1 kg)   LMP 03/18/2019 (Approximate)   BMI 34.01 kg/m  Vitals:   04/11/19 0811  BP: 95/70  Pulse: 78  Temp: (!) 97.2 F (36.2 C)  TempSrc: Temporal  Weight: 192 lb (87.1 kg)  Body mass index is 34.01 kg/m.   Physical Exam Constitutional:      General: She is not in acute distress.    Appearance: Normal appearance. She is not diaphoretic.  HENT:     Head: Normocephalic and atraumatic.  Cardiovascular:     Rate and Rhythm: Normal rate and regular rhythm.  Pulmonary:     Effort: Pulmonary effort is normal. No respiratory distress.  Genitourinary:    Comments: GYN:  External genitalia within normal limits.  Vaginal mucosa pink, moist, normal rugae.  Nonfriable cervix without lesions, no discharge or bleeding noted on speculum exam.  Skin:    General: Skin is warm and dry.     Findings: No rash.  Neurological:     Mental Status: She is alert and oriented to person, place, and time. Mental status is at baseline.  Psychiatric:        Mood and Affect: Mood normal.        Behavior: Behavior normal.    Results for orders placed or performed in visit on 04/11/19  POCT urine pregnancy  Result Value Ref Range   Preg Test, Ur Negative Negative       Assessment & Plan   1. Encounter for IUD insertion -See procedure note for Mirena IUD insertion - levonorgestrel (MIRENA) 20 MCG/24HR IUD 1 each  2. Menorrhagia with regular cycle -Longstanding problem not improved with  OCPs -We discussed options for menorrhagia and patient decides to go with Mirena IUD -See procedure note for this    Meds ordered this encounter  Medications  . levonorgestrel (MIRENA) 20 MCG/24HR IUD 1 each     Return in about 4 weeks (around 05/09/2019) for IUD string check.   The entirety of the information documented in the History of Present Illness, Review of Systems and Physical Exam were personally obtained by me. Portions of this information were initially documented by Ashley Royalty, CMA and reviewed by me for thoroughness and accuracy.    Joeziah Voit, Dionne Bucy, MD MPH [facility]

## 2019-04-11 NOTE — Progress Notes (Signed)
IUD Insertion Procedure Note  Pre-operative Diagnosis: desire for contraception, menorrhagia  Post-operative Diagnosis: normal  Indications: contraception  Procedure Details  Urine pregnancy test was done and result was negative.  The risks (including infection, bleeding, pain, and uterine perforation) and benefits of the procedure were explained to the patient and Written informed consent was obtained.    Cervix cleansed with Betadine. Tenaculum applied to anterior cervix. Uterus sounded to 9 cm. IUD inserted without difficulty. String visible and trimmed. Patient tolerated procedure well.  IUD Information: Mirena, Lot # [id number], Expiration date Apr 2022.  Condition: Stable  Complications: None  Plan:  The patient was advised to call for any fever or for prolonged or severe pain or bleeding. She was advised to use OTC analgesics as needed for mild to moderate pain.    Wilba Mutz, Dionne Bucy, MD, MPH [facility]

## 2019-04-15 ENCOUNTER — Ambulatory Visit: Payer: BLUE CROSS/BLUE SHIELD | Attending: Physician Assistant | Admitting: Physical Therapy

## 2019-04-15 ENCOUNTER — Other Ambulatory Visit: Payer: Self-pay

## 2019-04-15 DIAGNOSIS — R278 Other lack of coordination: Secondary | ICD-10-CM | POA: Insufficient documentation

## 2019-04-15 DIAGNOSIS — M62838 Other muscle spasm: Secondary | ICD-10-CM

## 2019-04-15 DIAGNOSIS — M533 Sacrococcygeal disorders, not elsewhere classified: Secondary | ICD-10-CM | POA: Diagnosis not present

## 2019-04-15 NOTE — Patient Instructions (Signed)
Stretch   Cross thigh over other  towel under thigh  Inhale, exhale pull close  10 reps    ___   Avoid straining pelvic floor, abdominal muscles , spine  Use log rolling technique instead of getting out of bed with your neck or the sit-up     Log rolling into and out of bed   Log rolling into and out of bed If getting out of bed on R side, Bent knees, scoot hips/ shoulder to L  Raise R arm completely overhead, rolling onto armpit  Then lower bent knees to bed to get into complete side lying position  Then drop legs off bed, and push up onto R elbow/forearm, and use L hand to push onto the bed    ___   3- point taps   R hand on the wall to side   Soft bend in the L standing knee Starting position with feet under the width of hips  R [place] tap the ground:  Forward, back to center ( hip width position), Right,  back to center ( hip width position), Back, back to center ( hip width position),  __  Notice standing on equal weight on both legs

## 2019-04-16 NOTE — Therapy (Signed)
[place] MAIN [facility] SERVICES [address] [place], [place], [address] Phone: [phone]   Fax:  [phone]  Physical Therapy Evaluation  Patient Details  Name: Wendy [name] MRN: [id number] Date of Birth: Jul 16, 1975 Referring Provider (PT): Fenton Malling    Encounter Date: 04/15/2019  PT End of Session - 04/15/19 1609    Visit Number  1    Number of Visits  10    Date for PT Re-Evaluation  06/24/19    PT Start Time  1510    PT Stop Time  1605    PT Time Calculation (min)  55 min       Past Medical History:  Diagnosis Date  . Abnormal Pap smear of cervix 10/2011  . Allergy   . Anxiety   . Depression   . Heart murmur    slight    Past Surgical History:  Procedure Laterality Date  . CESAREAN SECTION  2004   x1  . LAPAROSCOPIC TUBAL LIGATION  05/02/2012   Procedure: LAPAROSCOPIC TUBAL LIGATION;  Surgeon: Emily Filbert, MD;  Location: [facility];  Service: Gynecology;  Laterality: Bilateral;  with filshie clips  . TUBAL LIGATION  04/2012  . WISDOM TOOTH EXTRACTION     x4  . WRIST SURGERY     rigth cyst removal.    There were no vitals filed for this visit.   Subjective Assessment - 04/15/19 1514    Subjective Cystocele/ nocturia/ Pt reports her bladder has fallen to the point where it is not outside the vaginal.  Pt does not strain with bowel movements which occur daily.  Pt does not empty out her bladder. Pt gets up 4 x a night to pee. Pt has not undergone a sleep study.  During the day, pt notices little and sometimes big leaks but not running down her leg. SUI does not happen. Pt does not wear a pad. Denied LBP, dyspareunia.   Pertinent Hx: During her childhood, pt had bladder spasms and wet her bed until she was 16 years. Took medications, tried shock underwent. Pt thinks she outgrew it. Pt had 1 vaginal delivery with a tear from front to back in addition to 1 C- section  18 and 16 year ago. Pt also reports her 1st child also  broke her tailbone during her delivery. Pt also had diastasis recti, abdominal hernia located L lateral region above umbilicus and hemorrhoids,  after L & D.    Her mom and her grandma also has had prolapse and bladder tack. 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  Getting back to running just in the past year 1 miles. Pt used run half marathons 2010- 2015. Pt ran since high school. Pt injured her gastroc with Halina Andreas. It has healed but after a while the L gastroc starts to pull. Pt works as a [employer] average of 6 hours  per day. .     Pertinent History  --    Patient Stated Goals  strengthen the area and prevent having her bladder tacked up and being able to empty her bladder         [facility] PT Assessment - 04/16/19 0949      Assessment   Medical Diagnosis  Cystocele     Referring Provider (PT)  Fenton Malling       Precautions   Precautions  None  Restrictions   Weight Bearing Restrictions  No      Balance Screen   Has the patient fallen in the past 6 months  No      Coordination   Gross Motor Movements are Fluid and Coordinated  --    diaphragmatic excursion, limited pelvic floor excursion     Strength   Overall Strength Comments  hip flexion B 4-/5, knee ext/ flex B 4/5 , hip abd 5/5 B       Palpation   Spinal mobility  WFL in rotation/ sideflexion    SI assessment   levelled     Palpation comment  coccyx deviated to R, tightness at sacrococcygeus/ coccygeus, R SIJ hypomobile, limited in nutation/ abd/ER . C- section scar restriction on R > L    post Tx: improved mobility     Bed Mobility   Bed Mobility  --   half crunch                Objective measurements completed on examination: See above findings.    Pelvic Floor Special Questions - 04/16/19 0950    Diastasis Recti  neg     External Perineal Exam  limited pelvic floor excursion        [facility] Adult PT Treatment/Exercise -  04/16/19 0949      Neuro Re-ed    Neuro Re-ed Details   see pt instructions to minimzie straining. worsening prolpase, technique for log rolling, and technique for HEP with feet propioception       Modalities   Modalities  Moist Heat      Moist Heat Therapy   Moist Heat Location  --   sacrum     Manual Therapy   Manual therapy comments  superior mob/ MWM/PA mob rotation m ob STM to promote nutation and mobility at R SIJ                   PT Long Term Goals - 04/15/19 1535      PT LONG TERM GOAL #1   Title  Pt will demo more medially aligned occyx, no tightness at R sacococcgygeal ligament/ coccygeus, increased R SIJ mobility and more nutation of sacrum in order to progress to pelvic floor and dep core strengthening    Time  2    Period  Weeks    Status  New    Target Date  04/29/19      PT LONG TERM GOAL #2   Title  Pt will demo proper deep core coordination, proper lengthening and upward lifting of anterior triangle of mm with contraction of pelvic floor in order to empty urine completely and promote more caudal position of bladder    Time  4    Period  Weeks    Status  New    Target Date  05/13/19      PT LONG TERM GOAL #3   Title  Pt will demo increased fascial and scar mobility at perineum and C-section scar in order to promote proper pelvic floor lengthening for complete urination    Time  6    Period  Weeks    Status  New    Target Date  05/27/19      PT LONG TERM GOAL #4   Title  Pt will be referred to PCP for sleep study to screen of OSA given her nocturia 4 x night and sometimes waking up with panic attacks    Time  4  Period  Weeks    Status  New    Target Date  05/13/19             Plan - 04/16/19 0954    Clinical Impression Statement  Pt is a 43 yo female who reports of prolapse Sx, urinary incontinence, and nocturia. Pt has Hx of birth traumas that include tailbone injury, diastasis recti, abdominal hernia, perineal tear, and C-  section related to both her children 16 and 18 years ago.     Upon assessment, the following deficits were noted:   -cocccyx deviated to R with hypomobility of sacrum in nutation and abduction/ER of iliac crest which is related to her Hx of coccyx injury during L & D of her 1st child 18 years ago  -fascial restrictions C-section (delivery method of 2nd child 16 years ago)  is associated with limited anterior pelvic floor lengthening and report of incomplete emptying and prolapse   -limited pelvic floor mobility and deep core coordination  Following Tx today, pt demo'd increased mobility at R SIJ, and more medially aligned coccyx. Discussed f/u with her PCP on sleep study screening which her PCP had already suggested to her and pt was provided education on the association between OSA and nocturia.Pt voiced understanding and agreement.  Pt also showed proper log rolling technique to mimimize worsening of prolapse.    Plan to further assess and treat C-section next session to promote more fascial mobility for deep core coordination.   Pt will also benefit from education on modification to fitness exercises to minimize worsening of prolapse.     Personal Factors and Comorbidities  Fitness;Other    Examination-Activity Limitations  Toileting    Stability/Clinical Decision Making  Evolving/Moderate complexity    Clinical Decision Making  Moderate    Rehab Potential  Good    PT Frequency  1x / week    PT Duration  --   10   PT Treatment/Interventions  Moist Heat;Neuromuscular re-education;Functional mobility training;Therapeutic activities;Patient/family education;Taping;Balance training;Scar mobilization;Energy conservation;Manual techniques;Gait training;Therapeutic exercise    Consulted and Agree with Plan of Care  Patient       Patient will benefit from skilled therapeutic intervention in order to improve the following deficits and impairments:  Decreased activity tolerance, Decreased range  of motion, Decreased endurance, Decreased strength, Decreased coordination, Decreased mobility, Hypomobility, Postural dysfunction, Increased muscle spasms, Improper body mechanics, Increased fascial restricitons  Visit Diagnosis: Sacrococcygeal disorders, not elsewhere classified  Other muscle spasm  Other lack of coordination     Problem List Patient Active Problem List   Diagnosis Date Noted  . GAD (generalized anxiety disorder) 11/05/2015  . ADHD (attention deficit hyperactivity disorder) 11/05/2015  . Obese 11/05/2015  . Urinary incontinence in female 11/05/2015  . Constipation 11/05/2015  . Menstrual migraine 11/05/2015  . Contraception management 05/02/2012  . Depression 02/13/2012  . Abnormal Pap smear of cervix 10/19/2011    Jerl Mina ,PT, DPT, E-RYT  04/16/2019, 9:57 AM  [place] MAIN [facility] SERVICES [address] [place], [place], [address] Phone: [phone]   Fax:  [phone]  Name: Wendy [name] MRN: [id number] Date of Birth: 28-Dec-1975

## 2019-04-22 ENCOUNTER — Ambulatory Visit: Payer: BLUE CROSS/BLUE SHIELD | Attending: Physician Assistant | Admitting: Physical Therapy

## 2019-04-22 DIAGNOSIS — R2689 Other abnormalities of gait and mobility: Secondary | ICD-10-CM | POA: Insufficient documentation

## 2019-04-22 DIAGNOSIS — M533 Sacrococcygeal disorders, not elsewhere classified: Secondary | ICD-10-CM | POA: Insufficient documentation

## 2019-04-22 DIAGNOSIS — R278 Other lack of coordination: Secondary | ICD-10-CM | POA: Insufficient documentation

## 2019-04-22 DIAGNOSIS — M62838 Other muscle spasm: Secondary | ICD-10-CM | POA: Insufficient documentation

## 2019-04-24 ENCOUNTER — Encounter: Payer: Self-pay | Admitting: Family Medicine

## 2019-04-24 ENCOUNTER — Other Ambulatory Visit: Payer: Self-pay

## 2019-04-24 ENCOUNTER — Ambulatory Visit (INDEPENDENT_AMBULATORY_CARE_PROVIDER_SITE_OTHER): Payer: BLUE CROSS/BLUE SHIELD | Admitting: Family Medicine

## 2019-04-24 VITALS — BP 107/77 | HR 70 | Temp 96.9°F | Wt 190.0 lb

## 2019-04-24 DIAGNOSIS — Z30431 Encounter for routine checking of intrauterine contraceptive device: Secondary | ICD-10-CM | POA: Diagnosis not present

## 2019-04-24 DIAGNOSIS — N926 Irregular menstruation, unspecified: Secondary | ICD-10-CM

## 2019-04-24 NOTE — Patient Instructions (Signed)

## 2019-04-24 NOTE — Progress Notes (Signed)
Patient: Wendy [name] Female    DOB: 24-Nov-1975   43 y.o.   MRN: [id number] Visit Date: 04/24/2019  Today's Provider: Lavon Paganini, MD   No chief complaint on file.  Subjective:     HPI   Pt is here for an IUD Check.  Pt reports she is still bleeding and having a lot of pelvic pressure and cramping. Initially light bleeding then steady flow. Felt like typical period with cramping period. Not heavy. Bright red blood, not spotting.  Couldn't feel strings when she felt for them which worried her    Allergies  Allergen Reactions  . Peanuts [Peanut Oil] Shortness Of Breath  . Banana Itching  . Other Other (See Comments)    Walnuts- makes tongue blister  . Codeine Nausea And Vomiting  . Eggs Or Egg-Derived Products Nausea And Vomiting  . Vicodin [Hydrocodone-Acetaminophen] Nausea And Vomiting     Current Outpatient Medications:  .  ALPRAZolam (XANAX) 0.5 MG tablet, TAKE 1/2 TO 1 TABLET TWICE A DAY AS NEEDED FOR ANXIETY, Disp: 60 tablet, Rfl: 5 .  Cholecalciferol (VITAMIN D PO), Take by mouth., Disp: , Rfl:  .  fluticasone furoate-vilanterol (BREO ELLIPTA) 100-25 MCG/INH AEPB, Inhale 1 puff into the lungs daily., Disp: 60 each, Rfl: 0 .  MAGNESIUM CARBONATE PO, Take by mouth., Disp: , Rfl:  .  metroNIDAZOLE (FLAGYL) 500 MG tablet, Take 1 tablet (500 mg total) by mouth 2 (two) times daily., Disp: 14 tablet, Rfl: 0 .  Multiple Vitamin (MULTI VITAMIN DAILY PO), Take by mouth., Disp: , Rfl:  .  PROAIR HFA 108 (90 Base) MCG/ACT inhaler, INHALE 2 PUFFS INTO THE LUNGS EVERY 4-6 HOURS AS NEEDED, Disp: 18 g, Rfl: 1 .  tretinoin (RETIN-A) 0.1 % cream, , Disp: , Rfl:  .  vortioxetine HBr (TRINTELLIX) 10 MG TABS tablet, Take 1 tablet (10 mg total) by mouth daily., Disp: 90 tablet, Rfl: 3  Review of Systems  Constitutional: Negative.   Respiratory: Negative.   Cardiovascular: Negative.   Gastrointestinal: Negative.   Genitourinary: Positive for vaginal bleeding.    Social  History   Tobacco Use  . Smoking status: Never Smoker  . Smokeless tobacco: Never Used  Substance Use Topics  . Alcohol use: Yes    Comment: occasional/rare      Objective:   There were no vitals taken for this visit. There were no vitals filed for this visit.There is no height or weight on file to calculate BMI.   Physical Exam Vitals signs reviewed.  Constitutional:      General: She is not in acute distress.    Appearance: Normal appearance. She is well-developed.  HENT:     Head: Normocephalic and atraumatic.  Eyes:     General: No scleral icterus.    Conjunctiva/sclera: Conjunctivae normal.  Cardiovascular:     Rate and Rhythm: Normal rate and regular rhythm.  Pulmonary:     Effort: Pulmonary effort is normal. No respiratory distress.  Genitourinary:    Comments: GYN:  External genitalia within normal limits.  Vaginal mucosa pink, moist, normal rugae.  Nonfriable cervix without lesions, no discharge, + bleeding noted on speculum exam.  IUD strings easily visualized from external os.    Skin:    General: Skin is warm and dry.     Capillary Refill: Capillary refill takes less than 2 seconds.     Findings: No rash.  Neurological:     Mental Status: She is alert and oriented  to person, place, and time. Mental status is at baseline.  Psychiatric:        Behavior: Behavior normal.      No results found for any visits on 04/24/19.     Assessment & Plan   1. Irregular bleeding 2. IUD check up - IUD in place - no expulsion - discussed that for first 3-6 months of IUD use, bleeding can be irregular  - as it is not heavy and cramping is minimal, no imaging at this time - if bleeding persists or gets heavier, could use temporary OCP to reset menses - return precautions discussed   Return if symptoms worsen or fail to improve.   The entirety of the information documented in the History of Present Illness, Review of Systems and Physical Exam were personally  obtained by me. Portions of this information were initially documented by Ashley Royalty, CMA and reviewed by me for thoroughness and accuracy.    Lysette Lindenbaum, Dionne Bucy, MD MPH [facility]

## 2019-04-29 ENCOUNTER — Ambulatory Visit: Payer: BLUE CROSS/BLUE SHIELD | Admitting: Physical Therapy

## 2019-04-29 ENCOUNTER — Other Ambulatory Visit: Payer: Self-pay

## 2019-04-29 DIAGNOSIS — R2689 Other abnormalities of gait and mobility: Secondary | ICD-10-CM | POA: Diagnosis not present

## 2019-04-29 DIAGNOSIS — M533 Sacrococcygeal disorders, not elsewhere classified: Secondary | ICD-10-CM | POA: Diagnosis not present

## 2019-04-29 DIAGNOSIS — R278 Other lack of coordination: Secondary | ICD-10-CM | POA: Diagnosis present

## 2019-04-29 DIAGNOSIS — M62838 Other muscle spasm: Secondary | ICD-10-CM

## 2019-04-29 NOTE — Therapy (Signed)
[place] MAIN [facility] SERVICES [address] [place], [place], [address] Phone: [phone]   Fax:  [phone]  Physical Therapy Treatment  Patient Details  Name: Wendy [name] MRN: [id number] Date of Birth: 28-May-1976 Referring Provider (PT): Fenton Malling    Encounter Date: 04/29/2019  PT End of Session - 04/29/19 1635    Visit Number  2    Number of Visits  10    Date for PT Re-Evaluation  06/24/19    PT Start Time  1505    PT Stop Time  1600    PT Time Calculation (min)  55 min       Past Medical History:  Diagnosis Date  . Abnormal Pap smear of cervix 10/2011  . Allergy   . Anxiety   . Depression   . Heart murmur    slight    Past Surgical History:  Procedure Laterality Date  . CESAREAN SECTION  2004   x1  . LAPAROSCOPIC TUBAL LIGATION  05/02/2012   Procedure: LAPAROSCOPIC TUBAL LIGATION;  Surgeon: Emily Filbert, MD;  Location: [facility];  Service: Gynecology;  Laterality: Bilateral;  with filshie clips  . TUBAL LIGATION  04/2012  . WISDOM TOOTH EXTRACTION     x4  . WRIST SURGERY     rigth cyst removal.    There were no vitals filed for this visit.  Subjective Assessment - 04/29/19 1507    Subjective  Pt did not feel sore nor pain from last manual Tx. Exericises are going ok. Going to the bathroom at night has not been as bad, it varies between 2-3 x night.    Patient Stated Goals  strengthen the area and prevent having her bladder tacked up and being able to empty her bladder         [facility] PT Assessment - 04/29/19 1533      Coordination   Gross Motor Movements are Fluid and Coordinated  --   excessive posterior pelvic tilt with ab overuse      Palpation   SI assessment   coccyx not deviated, no tightness at cocgyeus.                 Pelvic Floor Special Questions - 04/29/19 1541    External Perineal Exam  anterior triangle: tight ischiocavernosus/ bulbospongiosus R > L.         [facility] Adult PT  Treatment/Exercise - 04/29/19 1639      Neuro Re-ed    Neuro Re-ed Details   see pt instructions, cued for proper pelvic floor lengthening/ deep core coordination      Modalities   Modalities  Moist Heat      Moist Heat Therapy   Number Minutes Moist Heat  5 Minutes    Moist Heat Location  --   during guided relaxation     Manual Therapy   Manual therapy comments  STM/MWM at anterior pelvic floor mm ( R >L) through clothing                   PT Long Term Goals - 04/15/19 1535      PT LONG TERM GOAL #1   Title  Pt will demo more medially aligned occyx, no tightness at R sacococcgygeal ligament/ coccygeus, increased R SIJ mobility and more nutation of sacrum in order to progress to pelvic floor and dep core strengthening    Time  2    Period  Weeks    Status  New    Target Date  04/29/19      PT LONG TERM GOAL #2   Title  Pt will demo proper deep core coordination, proper lengthening and upward lifting of anterior triangle of mm with contraction of pelvic floor in order to empty urine completely and promote more caudal position of bladder    Time  4    Period  Weeks    Status  New    Target Date  05/13/19      PT LONG TERM GOAL #3   Title  Pt will demo increased fascial and scar mobility at perineum and C-section scar in order to promote proper pelvic floor lengthening for complete urination    Time  6    Period  Weeks    Status  New    Target Date  05/27/19      PT LONG TERM GOAL #4   Title  Pt will be referred to PCP for sleep study to screen of OSA given her nocturia 4 x night and sometimes waking up with panic attacks    Time  4    Period  Weeks    Status  New    Target Date  05/13/19            Plan - 04/29/19 1635    Clinical Impression Statement  Pt demo'd good carry over with no more coccyx deviation from her last session from 2 weeks ago.Pt reports less nocturia episodes as well.  Pt requried external manual Tx to decrease anterior pelvic  floor mm tightness R > L which pt tolerated without complaints. Pt achieved increased pelvic floor and deep core lengthening post Tx and progressed to deep core strengthening exercises. Anticipate these achievements will help with improving intraabdominal pressure for minimizing worsening of prolapse. Pt continues to benefit from skilled PT.    Personal Factors and Comorbidities  Fitness;Other    Examination-Activity Limitations  Toileting    Stability/Clinical Decision Making  Evolving/Moderate complexity    Rehab Potential  Good    PT Frequency  1x / week    PT Duration  --   10   PT Treatment/Interventions  Moist Heat;Neuromuscular re-education;Functional mobility training;Therapeutic activities;Patient/family education;Taping;Balance training;Scar mobilization;Energy conservation;Manual techniques;Gait training;Therapeutic exercise    Consulted and Agree with Plan of Care  Patient       Patient will benefit from skilled therapeutic intervention in order to improve the following deficits and impairments:  Decreased activity tolerance, Decreased range of motion, Decreased endurance, Decreased strength, Decreased coordination, Decreased mobility, Hypomobility, Postural dysfunction, Increased muscle spasms, Improper body mechanics, Increased fascial restricitons  Visit Diagnosis: Sacrococcygeal disorders, not elsewhere classified  Other muscle spasm  Other lack of coordination     Problem List Patient Active Problem List   Diagnosis Date Noted  . GAD (generalized anxiety disorder) 11/05/2015  . ADHD (attention deficit hyperactivity disorder) 11/05/2015  . Obese 11/05/2015  . Urinary incontinence in female 11/05/2015  . Constipation 11/05/2015  . Menstrual migraine 11/05/2015  . Contraception management 05/02/2012  . Depression 02/13/2012  . Abnormal Pap smear of cervix 10/19/2011    Jerl Mina ,PT, DPT, E-RYT  04/29/2019, 4:40 PM  [place] MAIN [facility] SERVICES [address] [place], [place], [address] Phone: [phone]   Fax:  [phone]  Name: Wendy [name] MRN: [id number] Date of Birth: 09-12-1975

## 2019-04-29 NOTE — Patient Instructions (Addendum)
Half butterfl;y slides  10 reps both sides    Deep core level 1 and 2  (handout)    Body scan practice

## 2019-05-06 ENCOUNTER — Ambulatory Visit: Payer: BLUE CROSS/BLUE SHIELD | Admitting: Physical Therapy

## 2019-05-06 ENCOUNTER — Other Ambulatory Visit: Payer: Self-pay

## 2019-05-06 DIAGNOSIS — M533 Sacrococcygeal disorders, not elsewhere classified: Secondary | ICD-10-CM | POA: Diagnosis not present

## 2019-05-06 DIAGNOSIS — M62838 Other muscle spasm: Secondary | ICD-10-CM

## 2019-05-06 DIAGNOSIS — R278 Other lack of coordination: Secondary | ICD-10-CM

## 2019-05-06 NOTE — Patient Instructions (Signed)
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

## 2019-05-06 NOTE — Therapy (Signed)
[place] MAIN [facility] SERVICES [address] [place], [place], [address] Phone: [phone]   Fax:  [phone]  Physical Therapy Treatment  Patient Details  Name: [place] [name] MRN: [id number] Date of Birth: Jan 31, 1976 Referring Provider (PT): Fenton Malling    Encounter Date: 05/06/2019  PT End of Session - 05/06/19 1515    Visit Number  3    Number of Visits  10    Date for PT Re-Evaluation  06/24/19    PT Start Time  [id number]    PT Stop Time  1605    PT Time Calculation (min)  54 min       Past Medical History:  Diagnosis Date  . Abnormal Pap smear of cervix 10/2011  . Allergy   . Anxiety   . Depression   . Heart murmur    slight    Past Surgical History:  Procedure Laterality Date  . CESAREAN SECTION  2004   x1  . LAPAROSCOPIC TUBAL LIGATION  05/02/2012   Procedure: LAPAROSCOPIC TUBAL LIGATION;  Surgeon: Emily Filbert, MD;  Location: [facility];  Service: Gynecology;  Laterality: Bilateral;  with filshie clips  . TUBAL LIGATION  04/2012  . WISDOM TOOTH EXTRACTION     x4  . WRIST SURGERY     rigth cyst removal.    There were no vitals filed for this visit.  Subjective Assessment - 05/06/19 1515    Subjective  Pt reported she did not get to doing her exercises. Pt noticed more leakage last week. Pt had 4 long days at work ( 8 hr)  this past week. 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    Patient Stated Goals  strengthen the area and prevent having her bladder tacked up and being able to empty her bladder         [facility] PT Assessment - 05/06/19 1745      Observation/Other Assessments   Observations  poor alignment of feet/ kne/ hip with poor stability, overuse of upper body, poor weight shifting in lower kinetic chain in simulated positions at work                 Pelvic Floor Special Questions - 05/06/19 1746    External Perineal Exam  no pelvic  floor tightness. 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 / better alignment in lower kinetic chain to minimize straining abdominal pelvic area       Neuro Re-ed    Neuro Re-ed Details   cued for more lower kinetic chain / thoracolumbar  co-activation                   PT Long Term Goals - 04/15/19 1535      PT LONG TERM GOAL #1   Title  Pt will demo more medially aligned occyx, no tightness at R sacococcgygeal ligament/ coccygeus, increased R SIJ mobility and more nutation of sacrum in order to progress to pelvic floor and dep core strengthening    Time  2    Period  Weeks    Status  New    Target Date  04/29/19      PT LONG TERM  GOAL #2   Title  Pt will demo proper deep core coordination, proper lengthening and upward lifting of anterior triangle of mm with contraction of pelvic floor in order to empty urine completely and promote more caudal position of bladder    Time  4    Period  Weeks    Status  New    Target Date  05/13/19      PT LONG TERM GOAL #3   Title  Pt will demo increased fascial and scar mobility at perineum and C-section scar in order to promote proper pelvic floor lengthening for complete urination    Time  6    Period  Weeks    Status  New    Target Date  05/27/19      PT LONG TERM GOAL #4   Title  Pt will be referred to PCP for sleep study to screen of OSA given her nocturia 4 x night and sometimes waking up with panic attacks    Time  4    Period  Weeks    Status  New    Target Date  05/13/19            Plan - 05/06/19 1715    Clinical Impression Statement  Pt demo'd good carry over with no more pelvic floor tightness and demo'd improved pelvic floor coordination with more deep core mm without straining. Pt also demo'd improved body mechanics in work positions to  minimize straining abdominopelvic area and optimize lower kinetic chain and thoracolumbar system co-activation. Anticipate pt's attention to body mechanics at work will help minimize worsening of prolapse. Pt continues to benefit from skilled PT   Personal Factors and Comorbidities  Fitness;Other    Examination-Activity Limitations  Toileting    Stability/Clinical Decision Making  Evolving/Moderate complexity    Rehab Potential  Good    PT Frequency  1x / week    PT Duration  --   10   PT Treatment/Interventions  Moist Heat;Neuromuscular re-education;Functional mobility training;Therapeutic activities;Patient/family education;Taping;Balance training;Scar mobilization;Energy conservation;Manual techniques;Gait training;Therapeutic exercise    Consulted and Agree with Plan of Care  Patient       Patient will benefit from skilled therapeutic intervention in order to improve the following deficits and impairments:  Decreased activity tolerance, Decreased range of motion, Decreased endurance, Decreased strength, Decreased coordination, Decreased mobility, Hypomobility, Postural dysfunction, Increased muscle spasms, Improper body mechanics, Increased fascial restricitons  Visit Diagnosis: Sacrococcygeal disorders, not elsewhere classified  Other muscle spasm  Other lack of coordination     Problem List Patient Active Problem List   Diagnosis Date Noted  . GAD (generalized anxiety disorder) 11/05/2015  . ADHD (attention deficit hyperactivity disorder) 11/05/2015  . Obese 11/05/2015  . Urinary incontinence in female 11/05/2015  . Constipation 11/05/2015  . Menstrual migraine 11/05/2015  . Contraception management 05/02/2012  . Depression 02/13/2012  . Abnormal Pap smear of cervix 10/19/2011    Jerl Mina ,PT, DPT, E-RYT  05/06/2019, 5:48 PM  [place] MAIN [facility] SERVICES [address] [place], [place], [address] Phone: [phone]    Fax:  [phone]  Name: [place] [name] MRN: [id number] Date of Birth: 08-28-1975

## 2019-05-09 ENCOUNTER — Other Ambulatory Visit: Payer: Self-pay | Admitting: Physician Assistant

## 2019-05-09 ENCOUNTER — Encounter: Payer: Self-pay | Admitting: Physician Assistant

## 2019-05-09 DIAGNOSIS — F3341 Major depressive disorder, recurrent, in partial remission: Secondary | ICD-10-CM

## 2019-05-09 DIAGNOSIS — F411 Generalized anxiety disorder: Secondary | ICD-10-CM

## 2019-05-09 MED ORDER — ALPRAZOLAM 0.5 MG PO TABS: ORAL_TABLET | ORAL | 5 refills | Status: DC

## 2019-05-09 NOTE — Telephone Encounter (Signed)
Requested medication (s) are due for refill today: yes  Requested medication (s) are on the active medication list: yes  Last refill:  02/26/2019  Future visit scheduled: yes  Notes to clinic:  Refill cannot be delegated    Requested Prescriptions  Pending Prescriptions Disp Refills   ALPRAZolam (XANAX) 0.5 MG tablet [Pharmacy Med Name: ALPRAZOLAM 0.5 MG TAB] 60 tablet     Sig: TAKE 1/2 TO 1 TABLET TWICE DAILY AS NEEDED FOR ANXIETY     Not Delegated - Psychiatry:  Anxiolytics/Hypnotics Failed - 05/09/2019 12:44 PM      Failed - This refill cannot be delegated      Failed - Urine Drug Screen completed in last 360 days.      Passed - Valid encounter within last 6 months    Recent Outpatient Visits          2 weeks ago Irregular bleeding   [facility] [place], Dionne Bucy, MD   4 weeks ago Encounter for IUD insertion   [facility], Dionne Bucy, MD   1 month ago Vaginal odor   [facility] [place], Clearnce Sorrel, [place]   1 month ago Annual physical exam   [place], [place]   4 months ago Mild persistent asthma without complication   [facility] [place], Clearnce Sorrel, [place]      Future Appointments            In 1 week [place], Dionne Bucy, MD [facility], [place]

## 2019-05-17 NOTE — Progress Notes (Deleted)
   {  Method of visit:23308}  Patient: [place] [name] Female    DOB: [birth year]   43 y.o.   MRN: [id number] Visit Date: 05/17/2019  Today's Provider: Lavon Paganini, MD   No chief complaint on file.  Subjective:     HPI Patient here today to follow up from 04/11/2019 for IUD insertion. Allergies  Allergen Reactions  . Peanuts [Peanut Oil] Shortness Of Breath  . Banana Itching  . Other Other (See Comments)    Walnuts- makes tongue blister  . Codeine Nausea And Vomiting  . Eggs Or Egg-Derived Products Nausea And Vomiting  . Vicodin [Hydrocodone-Acetaminophen] Nausea And Vomiting     Current Outpatient Medications:  .  ALPRAZolam (XANAX) 0.5 MG tablet, TAKE 1/2 TO 1 TABLET TWICE A DAY AS NEEDED FOR ANXIETY, Disp: 60 tablet, Rfl: 5 .  Cholecalciferol (VITAMIN D PO), Take by mouth., Disp: , Rfl:  .  fluticasone furoate-vilanterol (BREO ELLIPTA) 100-25 MCG/INH AEPB, Inhale 1 puff into the lungs daily., Disp: 60 each, Rfl: 0 .  MAGNESIUM CARBONATE PO, Take by mouth., Disp: , Rfl:  .  metroNIDAZOLE (FLAGYL) 500 MG tablet, Take 1 tablet (500 mg total) by mouth 2 (two) times daily., Disp: 14 tablet, Rfl: 0 .  Multiple Vitamin (MULTI VITAMIN DAILY PO), Take by mouth., Disp: , Rfl:  .  PROAIR HFA 108 (90 Base) MCG/ACT inhaler, INHALE 2 PUFFS INTO THE LUNGS EVERY 4-6 HOURS AS NEEDED, Disp: 18 g, Rfl: 1 .  tretinoin (RETIN-A) 0.1 % cream, , Disp: , Rfl:  .  vortioxetine HBr (TRINTELLIX) 10 MG TABS tablet, Take 1 tablet (10 mg total) by mouth daily., Disp: 90 tablet, Rfl: 3  Review of Systems  Social History   Tobacco Use  . Smoking status: Never Smoker  . Smokeless tobacco: Never Used  Substance Use Topics  . Alcohol use: Yes    Comment: occasional/rare      Objective:   There were no vitals taken for this visit. There were no vitals filed for this visit.There is no height or weight on file to calculate BMI.   Physical Exam   No results found for any visits on 05/20/19.      Assessment & Plan        Lavon Paganini, MD  [facility]

## 2019-05-20 ENCOUNTER — Ambulatory Visit: Payer: BLUE CROSS/BLUE SHIELD | Admitting: Physical Therapy

## 2019-05-20 ENCOUNTER — Other Ambulatory Visit: Payer: Self-pay

## 2019-05-20 ENCOUNTER — Ambulatory Visit: Payer: BLUE CROSS/BLUE SHIELD | Admitting: Family Medicine

## 2019-05-20 DIAGNOSIS — M533 Sacrococcygeal disorders, not elsewhere classified: Secondary | ICD-10-CM | POA: Diagnosis not present

## 2019-05-20 DIAGNOSIS — M62838 Other muscle spasm: Secondary | ICD-10-CM

## 2019-05-20 DIAGNOSIS — R278 Other lack of coordination: Secondary | ICD-10-CM

## 2019-05-20 NOTE — Therapy (Signed)
[place] MAIN [facility] SERVICES [address] [place], [place], [address] Phone: [phone]   Fax:  [phone]  Physical Therapy Treatment  Patient Details  Name: [place] [name] MRN: [id number] Date of Birth: Apr 25, 1976 Referring Provider (PT): Fenton Malling    Encounter Date: 05/20/2019  PT End of Session - 05/20/19 1517    Visit Number  4    Number of Visits  10    Date for PT Re-Evaluation  06/24/19    PT Start Time  1510    PT Stop Time  1600    PT Time Calculation (min)  50 min       Past Medical History:  Diagnosis Date  . Abnormal Pap smear of cervix 10/2011  . Allergy   . Anxiety   . Depression   . Heart murmur    slight    Past Surgical History:  Procedure Laterality Date  . CESAREAN SECTION  2004   x1  . LAPAROSCOPIC TUBAL LIGATION  05/02/2012   Procedure: LAPAROSCOPIC TUBAL LIGATION;  Surgeon: Emily Filbert, MD;  Location: [facility];  Service: Gynecology;  Laterality: Bilateral;  with filshie clips  . TUBAL LIGATION  04/2012  . WISDOM TOOTH EXTRACTION     x4  . WRIST SURGERY     rigth cyst removal.    There were no vitals filed for this visit.  Subjective Assessment - 05/20/19 1512    Subjective  Pt went back to her gym 2 x last week. Pt told her trainer that she is not wanting to do sit ups. Pt is more conscious throughout the day and changing how she gets up out of bed, how to relax the pelvic floor when walking. Pt has not done her " kegely exercises". PT notices less pressure feeling by  the end of her work days which is an improvement. Pt notices she is leaking more when she is wearing a tampon, like there is more pressure feeling.    Patient Stated Goals  strengthen the area and prevent having her bladder tacked up and being able to empty her bladder         [facility] PT Assessment - 05/20/19 1700      AROM   Overall AROM Comments  OKC: high arches: minimal DF B ( post Tx: increased DF)       Palpation   SI  assessment   equal alignment of pelvic girdle      Ambulation/Gait   Pre-Gait Activities  Pre Tx: short strides, decreased hip flexion, minimal DF in pre swing ( post Tx: cued for increased hip flexion, mlonger strides, more DF )                 Pelvic Floor Special Questions - 05/20/19 1701    External Perineal Exam  without clothing: signficant tightnesss L bulbospongiosus/ urethra compressae, R bulbospongiosus, B transverse perineal mm         [facility] Adult PT Treatment/Exercise - 05/20/19 1700      Therapeutic Activites    Work Economist  discussed show wear and relationship to pelvic floor tightness ( recommended more mobile soles, less high heels)       Neuro Re-ed    Neuro Re-ed Details   see cues ( to increased pelvic floor mobility, gait mechanics(      Modalities   Modalities  Moist Heat      Moist Heat Therapy   Number Minutes Moist Heat  5 Minutes  Moist Heat Location  --   perineum ( through pillow case/ sheets)      Manual Therapy   Manual therapy comments  STM/ MWM at areas noted at pelvic floor ( external techniques),.  AP/AP mob and distraction at midfoot joint to promote DF                    PT Long Term Goals - 04/15/19 1535      PT LONG TERM GOAL #1   Title  Pt will demo more medially aligned occyx, no tightness at R sacococcgygeal ligament/ coccygeus, increased R SIJ mobility and more nutation of sacrum in order to progress to pelvic floor and dep core strengthening    Time  2    Period  Weeks    Status  New    Target Date  04/29/19      PT LONG TERM GOAL #2   Title  Pt will demo proper deep core coordination, proper lengthening and upward lifting of anterior triangle of mm with contraction of pelvic floor in order to empty urine completely and promote more caudal position of bladder    Time  4    Period  Weeks    Status  New    Target Date  05/13/19      PT LONG TERM GOAL #3   Title  Pt will demo increased fascial and scar  mobility at perineum and C-section scar in order to promote proper pelvic floor lengthening for complete urination    Time  6    Period  Weeks    Status  New    Target Date  05/27/19      PT LONG TERM GOAL #4   Title  Pt will be referred to PCP for sleep study to screen of OSA given her nocturia 4 x night and sometimes waking up with panic attacks    Time  4    Period  Weeks    Status  New    Target Date  05/13/19            Plan - 05/20/19 1704    Clinical Impression Statement Pt shows good carry over with improved body mechanics practices at home and at work. Pt has maintained equal pelvic girdle alignment and decreased hip/ sacral mm tightness. Today  Pt demo'd decreased anterior pelvic floor mm tightness post manual technique applied externally. Pt demo'd increased pelvic floor excursion and upward lift w/ exhalation post Tx.  Withholding kegel squeezes due to overactivity.  Applied regional interdependent approaches today to yield greater outcomes. Pt demo'd high arches and limited DF AROM. Following manual Tx on the feet and ankles, pt achieved increased DF AROM which improved her stride length and pre swing.  Discussed proper shoe wear to maintain more flexibility of feet, ( less high heels and inflexible shoe) . Anticipate improved feet mobility will help minimize relapse of pelvic floor tightness. Pt continues to benefit from skilled PT.    Personal Factors and Comorbidities  Fitness;Other    Examination-Activity Limitations  Toileting    Stability/Clinical Decision Making  Evolving/Moderate complexity    Rehab Potential  Good    PT Frequency  1x / week    PT Duration  --   10   PT Treatment/Interventions  Moist Heat;Neuromuscular re-education;Functional mobility training;Therapeutic activities;Patient/family education;Taping;Balance training;Scar mobilization;Energy conservation;Manual techniques;Gait training;Therapeutic exercise    Consulted and Agree with Plan of Care   Patient       Patient  will benefit from skilled therapeutic intervention in order to improve the following deficits and impairments:  Decreased activity tolerance, Decreased range of motion, Decreased endurance, Decreased strength, Decreased coordination, Decreased mobility, Hypomobility, Postural dysfunction, Increased muscle spasms, Improper body mechanics, Increased fascial restricitons  Visit Diagnosis: Sacrococcygeal disorders, not elsewhere classified  Other muscle spasm  Other lack of coordination     Problem List Patient Active Problem List   Diagnosis Date Noted  . GAD (generalized anxiety disorder) 11/05/2015  . ADHD (attention deficit hyperactivity disorder) 11/05/2015  . Obese 11/05/2015  . Urinary incontinence in female 11/05/2015  . Constipation 11/05/2015  . Menstrual migraine 11/05/2015  . Contraception management 05/02/2012  . Depression 02/13/2012  . Abnormal Pap smear of cervix 10/19/2011    Jerl Mina ,PT, DPT, E-RYT  05/20/2019, 7:17 PM  [place] MAIN [facility] SERVICES [address] [place], [place], [address] Phone: [phone]   Fax:  [phone]  Name: Prest[place] Jann MRN: [id number] Date of Birth: 08-09-1975

## 2019-05-20 NOTE — Patient Instructions (Signed)
Walk with higher knees , longer strides   Pelvic breath   Switch out to tennis shoes,  less time in [employer] and high heeled boots   for more flexible feet

## 2019-05-27 ENCOUNTER — Ambulatory Visit: Payer: BLUE CROSS/BLUE SHIELD | Admitting: Physical Therapy

## 2019-06-03 ENCOUNTER — Other Ambulatory Visit: Payer: Self-pay

## 2019-06-03 ENCOUNTER — Ambulatory Visit: Payer: BLUE CROSS/BLUE SHIELD | Attending: Physician Assistant | Admitting: Physical Therapy

## 2019-06-03 DIAGNOSIS — M62838 Other muscle spasm: Secondary | ICD-10-CM | POA: Insufficient documentation

## 2019-06-03 DIAGNOSIS — M533 Sacrococcygeal disorders, not elsewhere classified: Secondary | ICD-10-CM

## 2019-06-03 DIAGNOSIS — R278 Other lack of coordination: Secondary | ICD-10-CM | POA: Diagnosis present

## 2019-06-03 NOTE — Therapy (Signed)
[place] MAIN [facility] SERVICES [address] [place], [place], [address] Phone: [phone]   Fax:  [phone]  Physical Therapy Treatment  Patient Details  Name: [place] [name] MRN: [id number] Date of Birth: 03/18/1976 Referring Provider (PT): Fenton Malling    Encounter Date: 06/03/2019  PT End of Session - 06/03/19 1627    Visit Number  5    Number of Visits  10    Date for PT Re-Evaluation  06/24/19    PT Start Time  [id number]    PT Stop Time  [id number]    PT Time Calculation (min)  58 min       Past Medical History:  Diagnosis Date  . Abnormal Pap smear of cervix 10/2011  . Allergy   . Anxiety   . Depression   . Heart murmur    slight    Past Surgical History:  Procedure Laterality Date  . CESAREAN SECTION  2004   x1  . LAPAROSCOPIC TUBAL LIGATION  05/02/2012   Procedure: LAPAROSCOPIC TUBAL LIGATION;  Surgeon: Emily Filbert, MD;  Location: [facility];  Service: Gynecology;  Laterality: Bilateral;  with filshie clips  . TUBAL LIGATION  04/2012  . WISDOM TOOTH EXTRACTION     x4  . WRIST SURGERY     rigth cyst removal.    There were no vitals filed for this visit.  Subjective Assessment - 06/03/19 1520    Subjective  Pt did her exercises more than the previous week. Pt practiced picking up her feet when walking. Pt feels the pressure sensation is getting better by 10%.         [facility] PT Assessment - 06/03/19 1606      Palpation   Palpation comment  C section scar restrictions , R upper and lower quadrant tightness > L                 Pelvic Floor Special Questions - 06/03/19 1606    External Perineal Exam  without clothing: signficant tightness urethra compressa L > R     Pelvic Floor Internal Exam  pt provided verbal consent without contraindications     Exam Type  Vaginal    Palpation  tightness 5, 7 o'clock 1-3 rd layers, R obt int more tightness > L          [facility] Adult PT Treatment/Exercise - 06/03/19 1618       Exercises   Exercises  --   pelvic floor stretches      Modalities   Modalities  Moist Heat      Moist Heat Therapy   Number Minutes Moist Heat  5 Minutes    Moist Heat Location  --   perineum through pillow case/ sheet      Manual Therapy   Manual therapy comments  STM/ MWM at areas noted at pelvic floor ( external / internal techniques. fascial release over abdomen with MWM                     PT Long Term Goals - 04/15/19 1535      PT LONG TERM GOAL #1   Title  Pt will demo more medially aligned occyx, no tightness at R sacococcgygeal ligament/ coccygeus, increased R SIJ mobility and more nutation of sacrum in order to progress to pelvic floor and dep core strengthening    Time  2    Period  Weeks    Status  New  Target Date  04/29/19      PT LONG TERM GOAL #2   Title  Pt will demo proper deep core coordination, proper lengthening and upward lifting of anterior triangle of mm with contraction of pelvic floor in order to empty urine completely and promote more caudal position of bladder    Time  4    Period  Weeks    Status  New    Target Date  05/13/19      PT LONG TERM GOAL #3   Title  Pt will demo increased fascial and scar mobility at perineum and C-section scar in order to promote proper pelvic floor lengthening for complete urination    Time  6    Period  Weeks    Status  New    Target Date  05/27/19      PT LONG TERM GOAL #4   Title  Pt will be referred to PCP for sleep study to screen of OSA given her nocturia 4 x night and sometimes waking up with panic attacks    Time  4    Period  Weeks    Status  New    Target Date  05/13/19            Plan - 06/03/19 1627    Clinical Impression Statement  Pt is progressing well with decreasing pelvic floor tightness. Internal pelvic assessment showed posterior tightness which decreased with manual Tx. Anterior pelvic triangle mm tightness also showed increased mobility. Pt demo'd more sequential  lengthening and contraction postTx. PerFECT scheme scale increased from 2/5 to 3/5 but kegels were not added to HEP due to overactivity presentation. Pt also benefited from C-section scar releases to promote more abdominal wall tensigrity. Pt continues to benefit from skilled PT.     Personal Factors and Comorbidities  Fitness;Other    Examination-Activity Limitations  Toileting    Stability/Clinical Decision Making  Evolving/Moderate complexity    Rehab Potential  Good    PT Frequency  1x / week    PT Duration  --   10   PT Treatment/Interventions  Moist Heat;Neuromuscular re-education;Functional mobility training;Therapeutic activities;Patient/family education;Taping;Balance training;Scar mobilization;Energy conservation;Manual techniques;Gait training;Therapeutic exercise    Consulted and Agree with Plan of Care  Patient       Patient will benefit from skilled therapeutic intervention in order to improve the following deficits and impairments:  Decreased activity tolerance, Decreased range of motion, Decreased endurance, Decreased strength, Decreased coordination, Decreased mobility, Hypomobility, Postural dysfunction, Increased muscle spasms, Improper body mechanics, Increased fascial restricitons  Visit Diagnosis: Other muscle spasm  Sacrococcygeal disorders, not elsewhere classified  Other lack of coordination     Problem List Patient Active Problem List   Diagnosis Date Noted  . GAD (generalized anxiety disorder) 11/05/2015  . ADHD (attention deficit hyperactivity disorder) 11/05/2015  . Obese 11/05/2015  . Urinary incontinence in female 11/05/2015  . Constipation 11/05/2015  . Menstrual migraine 11/05/2015  . Contraception management 05/02/2012  . Depression 02/13/2012  . Abnormal Pap smear of cervix 10/19/2011    Jerl Mina  ,PT, DPT, E-RYT  06/03/2019, 4:28 PM  [place] MAIN [facility] SERVICES [address]  [place], [place], [address] Phone: [phone]   Fax:  [phone]  Name: [place] [name] MRN: [id number] Date of Birth: 1975/09/20

## 2019-06-03 NOTE — Patient Instructions (Signed)
Stretch for pelvic floor   "v heels slide away and then back toward buttocks and then rock knee to slight ,  slide heel along at 11 o clock away from buttocks   10 reps

## 2019-06-07 ENCOUNTER — Other Ambulatory Visit: Payer: Self-pay | Admitting: Physician Assistant

## 2019-06-10 ENCOUNTER — Ambulatory Visit: Payer: BLUE CROSS/BLUE SHIELD | Admitting: Physical Therapy

## 2019-07-15 ENCOUNTER — Ambulatory Visit
Admission: RE | Admit: 2019-07-15 | Discharge: 2019-07-15 | Disposition: A | Discharge status: Home / Self Care | Payer: 59 | Source: Ambulatory Visit | Attending: Physician Assistant | Admitting: Physician Assistant

## 2019-07-15 DIAGNOSIS — Z1231 Encounter for screening mammogram for malignant neoplasm of breast: Secondary | ICD-10-CM | POA: Diagnosis not present

## 2019-07-15 DIAGNOSIS — Z1239 Encounter for other screening for malignant neoplasm of breast: Secondary | ICD-10-CM

## 2019-07-17 ENCOUNTER — Telehealth: Payer: Self-pay

## 2019-07-17 NOTE — Telephone Encounter (Signed)
-----   Message from Mar D[place], [place] sent at 07/17/2019  2:08 PM EST ----- Normal mammogram. Repeat screening in one year.'

## 2019-07-17 NOTE — Telephone Encounter (Signed)
Left message advising pt.   Thanks,   -Jentry Warnell  

## 2019-07-18 ENCOUNTER — Telehealth: Payer: Self-pay

## 2019-07-18 DIAGNOSIS — F411 Generalized anxiety disorder: Secondary | ICD-10-CM

## 2019-07-18 DIAGNOSIS — F3341 Major depressive disorder, recurrent, in partial remission: Secondary | ICD-10-CM

## 2019-07-18 NOTE — Telephone Encounter (Signed)
PA done today waiting on response.

## 2019-07-18 NOTE — Telephone Encounter (Signed)
Copied from [place] [id number]. Topic: Quick Communication - Rx Refill/Question >> Jul 18, 2019 12:48 PM Erick Blinks wrote: Pt called to report that PA is needed for vortioxetine HBr (TRINTELLIX) 10 MG TABS tablet   [id number] [place] [address] [place] [address] Phone: [phone] Fax: [phone]

## 2019-07-19 NOTE — Telephone Encounter (Signed)
PA denied we will do an appeal for the medication.

## 2019-07-25 NOTE — Telephone Encounter (Signed)
PA questionnaires faxed with the appeal form

## 2019-07-25 NOTE — Telephone Encounter (Addendum)
[facility] called regarding a PA for pt. Stated they needed to following information: Clarification of diagnosis, Submission of Journal articles Medical records Alternative medications that failed.  Please advise.  [phone]

## 2019-08-05 MED ORDER — VILAZODONE HCL 20 MG PO TABS: 20.0000 mg | ORAL_TABLET | Freq: Every day | ORAL | 0 refills | Status: DC

## 2019-08-05 NOTE — Telephone Encounter (Signed)
Please see appeal denial letter. They want her to try Viibryd.

## 2019-08-05 NOTE — Telephone Encounter (Signed)
Pt mother calling to check status. She would like a call back from the office.

## 2019-08-05 NOTE — Addendum Note (Signed)
Addended by: Mar Daring on: 08/05/2019 07:12 PM   Modules accepted: Orders

## 2019-08-14 ENCOUNTER — Telehealth: Payer: Self-pay | Admitting: Physician Assistant

## 2019-08-14 DIAGNOSIS — F411 Generalized anxiety disorder: Secondary | ICD-10-CM

## 2019-08-14 DIAGNOSIS — F3341 Major depressive disorder, recurrent, in partial remission: Secondary | ICD-10-CM

## 2019-08-14 NOTE — Telephone Encounter (Signed)
Pt's mother  Shea Stakes (Mother)  [phone]    Dropped off a form for the pt regarding the Rx pt wants to continue taking.   Needing Tawanna Sat to fill it out and call Shelia back to pick it up. Please call Adela Lank if any questions.  Form was placed in Jenni's box.  Pt only has 5 left to take.  Thanks, [employer]

## 2019-08-15 MED ORDER — VORTIOXETINE HBR 20 MG PO TABS: 20.0000 mg | ORAL_TABLET | Freq: Every day | ORAL | 3 refills | Status: DC

## 2019-08-15 NOTE — Telephone Encounter (Signed)
Form completed and given to laura

## 2019-08-20 ENCOUNTER — Telehealth (INDEPENDENT_AMBULATORY_CARE_PROVIDER_SITE_OTHER): Payer: 59 | Admitting: Physician Assistant

## 2019-08-20 ENCOUNTER — Ambulatory Visit: Payer: 59 | Attending: Internal Medicine

## 2019-08-20 DIAGNOSIS — Z20822 Contact with and (suspected) exposure to covid-19: Secondary | ICD-10-CM

## 2019-08-20 DIAGNOSIS — J0110 Acute frontal sinusitis, unspecified: Secondary | ICD-10-CM

## 2019-08-20 MED ORDER — AMOXICILLIN-POT CLAVULANATE 875-125 MG PO TABS: 1.0000 | ORAL_TABLET | Freq: Two times a day (BID) | ORAL | 0 refills | Status: AC

## 2019-08-20 NOTE — Progress Notes (Signed)
Patient: Wendy [name] Female    DOB: [birth year]   44 y.o.   MRN: [id number] Visit Date: 08/20/2019  Today's Provider: Trinna Post, PA-C   Chief Complaint  Patient presents with  . Sinus Problem   Subjective:    Virtual Visit via Video Note  I connected with Paulita Cradle on 08/20/19 at  1:40 PM EST by a video enabled telemedicine application and verified that I am speaking with the correct person using two identifiers.  Location: Patient: Home Provider: Office    I discussed the limitations of evaluation and management by telemedicine and the availability of in person appointments. The patient expressed understanding and agreed to proceed.  Sinus Problem This is a new problem. The current episode started in the past 7 days. The problem has been gradually worsening since onset. There has been no fever. Her pain is at a severity of 4/10. The pain is mild. Associated symptoms include congestion, headaches, sinus pressure and sneezing. Pertinent negatives include no chills, hoarse voice, shortness of breath or sore throat. Past treatments include acetaminophen. The treatment provided mild relief.   Patient reports fatigue and sleeping until noon yesterday which is unusual for her. Reports she has a broken tooth and this is about to be pulled this upcoming week. Symptoms have been worsening over the past week. She reports her left side is worse. Some drainage out of her left eye which she reports is clear. Denies fever and SOB. She has not been tested for COVID since several months ago.    Allergies  Allergen Reactions  . Peanuts [Peanut Oil] Shortness Of Breath  . Banana Itching  . Other Other (See Comments)    Walnuts- makes tongue blister  . Codeine Nausea And Vomiting  . Eggs Or Egg-Derived Products Nausea And Vomiting  . Vicodin [Hydrocodone-Acetaminophen] Nausea And Vomiting     Current Outpatient Medications:  .  ALPRAZolam (XANAX) 0.5 MG tablet, TAKE 1/2 TO 1  TABLET TWICE A DAY AS NEEDED FOR ANXIETY, Disp: 60 tablet, Rfl: 5 .  Cholecalciferol (VITAMIN D PO), Take by mouth., Disp: , Rfl:  .  fluticasone furoate-vilanterol (BREO ELLIPTA) 100-25 MCG/INH AEPB, Inhale 1 puff into the lungs daily., Disp: 60 each, Rfl: 0 .  MAGNESIUM CARBONATE PO, Take by mouth., Disp: , Rfl:  .  metroNIDAZOLE (FLAGYL) 500 MG tablet, Take 1 tablet (500 mg total) by mouth 2 (two) times daily., Disp: 14 tablet, Rfl: 0 .  Multiple Vitamin (MULTI VITAMIN DAILY PO), Take by mouth., Disp: , Rfl:  .  PROAIR HFA 108 (90 Base) MCG/ACT inhaler, INHALE 2 PUFFS INTO THE LUNGS EVERY 4-6 HOURS AS NEEDED, Disp: 18 g, Rfl: 1 .  tretinoin (RETIN-A) 0.1 % cream, , Disp: , Rfl:  .  vortioxetine HBr (TRINTELLIX) 20 MG TABS tablet, Take 1 tablet (20 mg total) by mouth daily., Disp: 90 tablet, Rfl: 3  Review of Systems  Constitutional: Negative for chills.  HENT: Positive for congestion, dental problem, nosebleeds, sinus pressure, sinus pain and sneezing. Negative for hoarse voice and sore throat.   Respiratory: Negative for shortness of breath.   Neurological: Positive for headaches.    Social History   Tobacco Use  . Smoking status: Never Smoker  . Smokeless tobacco: Never Used  Substance Use Topics  . Alcohol use: Yes    Comment: occasional/rare      Objective:   There were no vitals taken for this visit. There were no vitals filed for  this visit.There is no height or weight on file to calculate BMI.   Physical Exam Constitutional:      Appearance: Normal appearance. She is not ill-appearing or toxic-appearing.  HENT:     Head: No right periorbital erythema or left periorbital erythema.     Comments: Left cheek appears slightly swollen.  Eyes:     General:        Right eye: No discharge or hordeolum.        Left eye: Discharge present.No hordeolum.  Pulmonary:     Effort: Pulmonary effort is normal. No respiratory distress.  Neurological:     Mental Status: She is  alert.      No results found for any visits on 08/20/19.     Assessment & Plan    1. Acute non-recurrent frontal sinusitis  Prescribe as below for worsening one sided sinus pain and pressure.   - amoxicillin-clavulanate (AUGMENTIN) 875-125 MG tablet; Take 1 tablet by mouth 2 (two) times daily for 7 days.  Dispense: 14 tablet; Refill: 0  I discussed the assessment and treatment plan with the patient. The patient was provided an opportunity to ask questions and all were answered. The patient agreed with the plan and demonstrated an understanding of the instructions.   The patient was advised to call back or seek an in-person evaluation if the symptoms worsen or if the condition fails to improve as anticipated.  I provided 15 minutes of non-face-to-face time during this encounter.  The entirety of the information documented in the History of Present Illness, Review of Systems and Physical Exam were personally obtained by me. Portions of this information were initially documented by [facility] and reviewed by me for thoroughness and accuracy.       Trinna Post, PA-C  [facility]

## 2019-08-21 LAB — SPECIMEN STATUS REPORT

## 2019-08-21 LAB — NOVEL CORONAVIRUS, NAA: SARS-CoV-2, NAA: NOT DETECTED

## 2019-09-20 ENCOUNTER — Telehealth (INDEPENDENT_AMBULATORY_CARE_PROVIDER_SITE_OTHER): Payer: 59 | Admitting: Physician Assistant

## 2019-09-20 ENCOUNTER — Encounter: Payer: Self-pay | Admitting: Physician Assistant

## 2019-09-20 DIAGNOSIS — F900 Attention-deficit hyperactivity disorder, predominantly inattentive type: Secondary | ICD-10-CM

## 2019-09-20 DIAGNOSIS — F4320 Adjustment disorder, unspecified: Secondary | ICD-10-CM

## 2019-09-20 DIAGNOSIS — R6889 Other general symptoms and signs: Secondary | ICD-10-CM

## 2019-09-20 DIAGNOSIS — R5382 Chronic fatigue, unspecified: Secondary | ICD-10-CM | POA: Diagnosis not present

## 2019-09-20 DIAGNOSIS — F4321 Adjustment disorder with depressed mood: Secondary | ICD-10-CM | POA: Diagnosis not present

## 2019-09-20 MED ORDER — AMPHETAMINE-DEXTROAMPHET ER 15 MG PO CP24: 15.0000 mg | ORAL_CAPSULE | ORAL | 0 refills | Status: DC

## 2019-09-20 NOTE — Patient Instructions (Signed)
Managing Loss, Adult People experience loss in many different ways throughout their lives. Events such as moving, changing jobs, and losing friends can create a sense of loss. The loss may be as serious as a major health change, divorce, death of a pet, or death of a loved one. All of these types of loss are likely to create a physical and emotional reaction known as grief. Grief is the result of a major change or an absence of something or someone that you count on. Grief is a normal reaction to loss. A variety of factors can affect your grieving experience, including:  The nature of your loss.  Your relationship to what or whom you lost.  Your understanding of grief and how to manage it.  Your support system. How to manage lifestyle changes Keep to your normal routine as much as possible.  If you have trouble focusing or doing normal activities, it is acceptable to take some time away from your normal routine.  Spend time with friends and loved ones.  Eat a healthy diet, get plenty of sleep, and rest when you feel tired. How to recognize changes  The way that you deal with your grief will affect your ability to function as you normally do. When grieving, you may experience these changes:  Numbness, shock, sadness, anxiety, anger, denial, and guilt.  Thoughts about death.  Unexpected crying.  A physical sensation of emptiness in your stomach.  Problems sleeping and eating.  Tiredness (fatigue).  Loss of interest in normal activities.  Dreaming about or imagining seeing the person who died.  A need to remember what or whom you lost.  Difficulty thinking about anything other than your loss for a period of time.  Relief. If you have been expecting the loss for a while, you may feel a sense of relief when it happens. Follow these instructions at home:  Activity Express your feelings in healthy ways, such as:  Talking with others about your loss. It may be helpful to find  others who have had a similar loss, such as a support group.  Writing down your feelings in a journal.  Doing physical activities to release stress and emotional energy.  Doing creative activities like painting, sculpting, or playing or listening to music.  Practicing resilience. This is the ability to recover and adjust after facing challenges. Reading some resources that encourage resilience may help you to learn ways to practice those behaviors. General instructions  Be patient with yourself and others. Allow the grieving process to happen, and remember that grieving takes time. ? It is likely that you may never feel completely done with some grief. You may find a way to move on while still cherishing memories and feelings about your loss. ? Accepting your loss is a process. It can take months or longer to adjust.  Keep all follow-up visits as told by your health care provider. This is important. Where to find support To get support for managing loss:  Ask your health care provider for help and recommendations, such as grief counseling or therapy.  Think about joining a support group for people who are managing a loss. Where to find more information You can find more information about managing loss from:  American Society of Clinical Oncology: www.cancer.net  American Psychological Association: www.apa.org Contact a health care provider if:  Your grief is extreme and keeps getting worse.  You have ongoing grief that does not improve.  Your body shows symptoms of grief, such   as illness.  You feel depressed, anxious, or lonely. Get help right away if:  You have thoughts about hurting yourself or others. If you ever feel like you may hurt yourself or others, or have thoughts about taking your own life, get help right away. You can go to your nearest emergency department or call:  Your local emergency services (911 in the [place]  A suicide crisis helpline, such as the  National Suicide Prevention Lifeline at 1-800-273-8255. This is open 24 hours a day. Summary  Grief is the result of a major change or an absence of someone or something that you count on. Grief is a normal reaction to loss.  The depth of grief and the period of recovery depend on the type of loss and your ability to adjust to the change and process your feelings.  Processing grief requires patience and a willingness to accept your feelings and talk about your loss with people who are supportive.  It is important to find resources that work for you and to realize that people experience grief differently. There is not one grieving process that works for everyone in the same way.  Be aware that when grief becomes extreme, it can lead to more severe issues like isolation, depression, anxiety, or suicidal thoughts. Talk with your health care provider if you have any of these issues. This information is not intended to replace advice given to you by your health care provider. Make sure you discuss any questions you have with your health care provider. Document Revised: 08/10/2018 Document Reviewed: 10/20/2016 Elsevier Patient Education  2020 Elsevier Inc.  

## 2019-09-20 NOTE — Progress Notes (Signed)
Patient: Wendy [name] Female    DOB: July 28, 1975   44 y.o.   MRN: [id number] Visit Date: 09/20/2019  Today's Provider: Mar Daring, PA-C   Chief Complaint  Patient presents with  . ADD  . Depression   Subjective:    Virtual Visit via Video Note  I connected with Paulita Cradle on 09/20/19 at  5:20 PM EDT by a video enabled telemedicine application and verified that I am speaking with the correct person using two identifiers.  Location: Patient: Home Provider: [facility]   I discussed the limitations of evaluation and management by telemedicine and the availability of in person appointments. The patient expressed understanding and agreed to proceed.  HPI  Depression: Patient reports that her symptoms are worsening. She lost her father the end of January due to covid 19. She is feeling very tired and doesn't want to talk to people at work. She just feels an emptiness. She is overeating.  ADHD: Patient wants to be put back on her medication. Feels like she is all over the place. She is having trouble concentrating.  Thyroid: Patient reports that when she had a thyroid check her mother noticed it was on the low side of normal and she stays cold all the time and she just wants to talk about it.  Allergies  Allergen Reactions  . Peanuts [Peanut Oil] Shortness Of Breath  . Banana Itching  . Other Other (See Comments)    Walnuts- makes tongue blister  . Codeine Nausea And Vomiting  . Eggs Or Egg-Derived Products Nausea And Vomiting  . Vicodin [Hydrocodone-Acetaminophen] Nausea And Vomiting     Current Outpatient Medications:  .  ALPRAZolam (XANAX) 0.5 MG tablet, TAKE 1/2 TO 1 TABLET TWICE A DAY AS NEEDED FOR ANXIETY, Disp: 60 tablet, Rfl: 5 .  Cholecalciferol (VITAMIN D PO), Take by mouth., Disp: , Rfl:  .  fluticasone furoate-vilanterol (BREO ELLIPTA) 100-25 MCG/INH AEPB, Inhale 1 puff into the lungs daily., Disp: 60 each, Rfl: 0 .  MAGNESIUM CARBONATE PO, Take by mouth.,  Disp: , Rfl:  .  Multiple Vitamin (MULTI VITAMIN DAILY PO), Take by mouth., Disp: , Rfl:  .  PROAIR HFA 108 (90 Base) MCG/ACT inhaler, INHALE 2 PUFFS INTO THE LUNGS EVERY 4-6 HOURS AS NEEDED, Disp: 18 g, Rfl: 1 .  tretinoin (RETIN-A) 0.1 % cream, , Disp: , Rfl:  .  vortioxetine HBr (TRINTELLIX) 20 MG TABS tablet, Take 1 tablet (20 mg total) by mouth daily., Disp: 90 tablet, Rfl: 3 .  metroNIDAZOLE (FLAGYL) 500 MG tablet, Take 1 tablet (500 mg total) by mouth 2 (two) times daily., Disp: 14 tablet, Rfl: 0  Review of Systems  Constitutional: Positive for appetite change, fatigue and unexpected weight change.  HENT: Negative.   Respiratory: Negative.   Cardiovascular: Negative.   Gastrointestinal: Positive for nausea.  Neurological: Negative.   Psychiatric/Behavioral: Positive for agitation, decreased concentration, dysphoric mood and sleep disturbance. The patient is nervous/anxious.     Social History   Tobacco Use  . Smoking status: Never Smoker  . Smokeless tobacco: Never Used  Substance Use Topics  . Alcohol use: Yes    Comment: occasional/rare      Objective:   There were no vitals taken for this visit. There were no vitals filed for this visit.There is no height or weight on file to calculate BMI.   Physical Exam Vitals reviewed.  Constitutional:      General: She is not in acute distress.  Appearance: Normal appearance. She is well-developed. She is not ill-appearing.  HENT:     Head: Normocephalic and atraumatic.  Pulmonary:     Effort: Pulmonary effort is normal. No respiratory distress.  Musculoskeletal:     Cervical back: Normal range of motion and neck supple.  Neurological:     Mental Status: She is alert.  Psychiatric:        Attention and Perception: Attention and perception normal.        Mood and Affect: Mood is depressed. Affect is tearful.        Speech: Speech normal.        Behavior: Behavior normal. Behavior is cooperative.        Thought  Content: Thought content normal.        Cognition and Memory: Cognition and memory normal.        Judgment: Judgment normal.      No results found for any visits on 09/20/19.     Assessment & Plan     1. Attention deficit hyperactivity disorder (ADHD), predominantly inattentive type Will restart adderall XR 15mg  as below. She had been stable on this dose for many years but had stopped taking in 2018 because she was feeling stable at the time.  - amphetamine-dextroamphetamine (ADDERALL XR) 15 MG 24 hr capsule; Take 1 capsule by mouth every morning.  Dispense: 30 capsule; Refill: 0  2. Cold intolerance Worsening. Will check labs as below and f/u pending results. - CBC w/Diff/Platelet - TSH + free T4 - Fe+TIBC+Fer - Vitamin D (25 hydroxy) - B12 and Folate Panel  3. Chronic fatigue Suspect multifactorial with grief/depression playing a large role. Will check labs as below to r/o other organic factors.  - CBC w/Diff/Platelet - TSH + free T4 - Fe+TIBC+Fer - Vitamin D (25 hydroxy) - B12 and Folate Panel  4. Grief reaction Lost her father to Covid 17 in January. Has had a lot of grief and worsening of her depression since then. Is on Trintellix and feels that has been helping some. Had only been taking 10mg . Will increase to 20mg  of trintellix. F/U in 4 weeks    I discussed the assessment and treatment plan with the patient. The patient was provided an opportunity to ask questions and all were answered. The patient agreed with the plan and demonstrated an understanding of the instructions.   The patient was advised to call back or seek an in-person evaluation if the symptoms worsen or if the condition fails to improve as anticipated.  I provided 30 minutes of non-face-to-face time during this encounter.    Mar Daring, PA-C  [facility]

## 2019-10-03 ENCOUNTER — Encounter: Payer: Self-pay | Admitting: Physician Assistant

## 2019-10-04 ENCOUNTER — Telehealth (INDEPENDENT_AMBULATORY_CARE_PROVIDER_SITE_OTHER): Payer: 59 | Admitting: Physician Assistant

## 2019-10-04 ENCOUNTER — Encounter: Payer: Self-pay | Admitting: Physician Assistant

## 2019-10-04 DIAGNOSIS — J189 Pneumonia, unspecified organism: Secondary | ICD-10-CM

## 2019-10-04 DIAGNOSIS — J4530 Mild persistent asthma, uncomplicated: Secondary | ICD-10-CM | POA: Diagnosis not present

## 2019-10-04 MED ORDER — PREDNISONE 10 MG (21) PO TBPK: ORAL_TABLET | ORAL | 0 refills | Status: DC

## 2019-10-04 MED ORDER — AZITHROMYCIN 250 MG PO TABS: ORAL_TABLET | ORAL | 0 refills | Status: DC

## 2019-10-04 NOTE — Patient Instructions (Signed)
Community-Acquired Pneumonia, Adult Pneumonia is an infection of the lungs. It causes swelling in the airways of the lungs. Mucus and fluid may also build up inside the airways. One type of pneumonia can happen while a person is in a hospital. A different type can happen when a person is not in a hospital (community-acquired pneumonia).  What are the causes?  This condition is caused by germs (viruses, bacteria, or fungi). Some types of germs can be passed from one person to another. This can happen when you breathe in droplets from the cough or sneeze of an infected person. What increases the risk? You are more likely to develop this condition if you:  Have a long-term (chronic) disease, such as: ? Chronic obstructive pulmonary disease (COPD). ? Asthma. ? Cystic fibrosis. ? Congestive heart failure. ? Diabetes. ? Kidney disease.  Have HIV.  Have sickle cell disease.  Have had your spleen removed.  Do not take good care of your teeth and mouth (poor dental hygiene).  Have a medical condition that increases the risk of breathing in droplets from your own mouth and nose.  Have a weakened body defense system (immune system).  Are a smoker.  Travel to areas where the germs that cause this illness are common.  Are around certain animals or the places they live. What are the signs or symptoms?  A dry cough.  A wet (productive) cough.  Fever.  Sweating.  Chest pain. This often happens when breathing deeply or coughing.  Fast breathing or trouble breathing.  Shortness of breath.  Shaking chills.  Feeling tired (fatigue).  Muscle aches. How is this treated? Treatment for this condition depends on many things. Most adults can be treated at home. In some cases, treatment must happen in a hospital. Treatment may include:  Medicines given by mouth or through an IV tube.  Being given extra oxygen.  Respiratory therapy. In rare cases, treatment for very bad pneumonia  may include:  Using a machine to help you breathe.  Having a procedure to remove fluid from around your lungs. Follow these instructions at home: Medicines  Take over-the-counter and prescription medicines only as told by your doctor. ? Only take cough medicine if you are losing sleep.  If you were prescribed an antibiotic medicine, take it as told by your doctor. Do not stop taking the antibiotic even if you start to feel better. General instructions   Sleep with your head and neck raised (elevated). You can do this by sleeping in a recliner or by putting a few pillows under your head.  Rest as needed. Get at least 8 hours of sleep each night.  Drink enough water to keep your pee (urine) pale yellow.  Eat a healthy diet that includes plenty of vegetables, fruits, whole grains, low-fat dairy products, and lean protein.  Do not use any products that contain nicotine or tobacco. These include cigarettes, e-cigarettes, and chewing tobacco. If you need help quitting, ask your doctor.  Keep all follow-up visits as told by your doctor. This is important. How is this prevented? A shot (vaccine) can help prevent pneumonia. Shots are often suggested for:  People older than 44 years of age.  People older than 44 years of age who: ? Are having cancer treatment. ? Have long-term (chronic) lung disease. ? Have problems with their body's defense system. You may also prevent pneumonia if you take these actions:  Get the flu (influenza) shot every year.  Go to the dentist as   often as told.  Wash your hands often. If you cannot use soap and water, use hand sanitizer. Contact a doctor if:  You have a fever.  You lose sleep because your cough medicine does not help. Get help right away if:  You are short of breath and it gets worse.  You have more chest pain.  Your sickness gets worse. This is very serious if: ? You are an older adult. ? Your body's defense system is weak.  You  cough up blood. Summary  Pneumonia is an infection of the lungs.  Most adults can be treated at home. Some will need treatment in a hospital.  Drink enough water to keep your pee pale yellow.  Get at least 8 hours of sleep each night. This information is not intended to replace advice given to you by your health care provider. Make sure you discuss any questions you have with your health care provider. Document Revised: 09/26/2018 Document Reviewed: 02/01/2018 Elsevier Patient Education  2020 Elsevier Inc.  

## 2019-10-04 NOTE — Progress Notes (Signed)
MyChart Video Visit    Virtual Visit via Video Note   This visit type was conducted due to national recommendations for restrictions regarding the COVID-19 Pandemic (e.g. social distancing) in an effort to limit this patient's exposure and mitigate transmission in our community. This patient is at least at moderate risk for complications without adequate follow up. This format is felt to be most appropriate for this patient at this time. Physical exam was limited by quality of the video and audio technology used for the visit.   Patient location: Home Provider location: [facility]   Patient: Wendy [name]   DOB: 17-Jan-1976   44 y.o. Female  MRN: [id number] Visit Date: 10/04/2019  Today's Provider: Mar Daring, PA-C  Subjective:    Chief Complaint  Patient presents with  . URI   URI  This is a new problem. The current episode started in the past 7 days. The problem has been gradually worsening. There has been no fever. Associated symptoms include congestion, coughing, headaches and rhinorrhea. Pertinent negatives include no abdominal pain, chest pain, diarrhea, ear pain, nausea, sinus pain, sneezing or wheezing. She has tried antihistamine and inhaler use for the symptoms. The treatment provided mild relief.  Her mother is a Marine scientist and auscultated her lungs. Heard crackles in RLL.   Patient Active Problem List   Diagnosis Date Noted  . GAD (generalized anxiety disorder) 11/05/2015  . ADHD (attention deficit hyperactivity disorder) 11/05/2015  . Obese 11/05/2015  . Urinary incontinence in female 11/05/2015  . Constipation 11/05/2015  . Menstrual migraine 11/05/2015  . Contraception management 05/02/2012  . Depression 02/13/2012  . Abnormal Pap smear of cervix 10/19/2011   Past Medical History:  Diagnosis Date  . Abnormal Pap smear of cervix 10/2011  . Allergy   . Anxiety   . Depression   . Heart murmur    slight      Medications: Outpatient Medications Prior to Visit   Medication Sig  . ALPRAZolam (XANAX) 0.5 MG tablet TAKE 1/2 TO 1 TABLET TWICE A DAY AS NEEDED FOR ANXIETY  . amphetamine-dextroamphetamine (ADDERALL XR) 15 MG 24 hr capsule Take 1 capsule by mouth every morning.  . Cholecalciferol (VITAMIN D PO) Take by mouth.  . fluticasone furoate-vilanterol (BREO ELLIPTA) 100-25 MCG/INH AEPB Inhale 1 puff into the lungs daily.  Marland Kitchen MAGNESIUM CARBONATE PO Take by mouth.  . Multiple Vitamin (MULTI VITAMIN DAILY PO) Take by mouth.  Marland Kitchen PROAIR HFA 108 (90 Base) MCG/ACT inhaler INHALE 2 PUFFS INTO THE LUNGS EVERY 4-6 HOURS AS NEEDED  . tretinoin (RETIN-A) 0.1 % cream   . vortioxetine HBr (TRINTELLIX) 20 MG TABS tablet Take 1 tablet (20 mg total) by mouth daily.   No facility-administered medications prior to visit.    Last CBC Lab Results  Component Value Date   WBC 8.9 03/14/2019   HGB 13.1 03/14/2019   HCT 38.8 03/14/2019   MCV 93 03/14/2019   MCH 31.3 03/14/2019   RDW 13.0 03/14/2019   PLT 232 [id number]   Last metabolic panel Lab Results  Component Value Date   GLUCOSE 95 03/14/2019   NA 140 03/14/2019   K 4.3 03/14/2019   CL 103 03/14/2019   CO2 23 03/14/2019   BUN 16 03/14/2019   CREATININE 0.74 03/14/2019   GFRNONAA 100 03/14/2019   GFRAA 115 03/14/2019   CALCIUM 9.8 03/14/2019   PROT 6.3 03/14/2019   ALBUMIN 4.3 03/14/2019   LABGLOB 2.0 03/14/2019   AGRATIO 2.2 03/14/2019  BILITOT 0.3 03/14/2019   ALKPHOS 80 03/14/2019   AST 18 03/14/2019   ALT 14 03/14/2019   Last lipids Lab Results  Component Value Date   CHOL 181 03/14/2019   HDL 97 03/14/2019   LDLCALC 75 03/14/2019   TRIG 43 03/14/2019   CHOLHDL 1.9 03/14/2019   Last hemoglobin A1c No results found for: HGBA1C Last thyroid functions Lab Results  Component Value Date   TSH 0.984 03/14/2019      Review of Systems  Constitutional: Negative for chills, diaphoresis, fatigue and fever.  HENT: Positive for congestion and rhinorrhea. Negative for ear pain,  postnasal drip, sinus pressure, sinus pain and sneezing.   Respiratory: Positive for cough and chest tightness. Negative for shortness of breath and wheezing.   Cardiovascular: Negative for chest pain, palpitations and leg swelling.  Gastrointestinal: Negative for abdominal pain, diarrhea and nausea.  Neurological: Positive for headaches.         Objective:    There were no vitals taken for this visit. BP Readings from Last 3 Encounters:  04/24/19 107/77  04/11/19 95/70  04/05/19 100/70   Wt Readings from Last 3 Encounters:  04/24/19 190 lb (86.2 kg)  04/11/19 192 lb (87.1 kg)  04/05/19 190 lb (86.2 kg)      Physical Exam Vitals reviewed.  Constitutional:      General: She is not in acute distress.    Appearance: Normal appearance. She is well-developed. She is not ill-appearing.  HENT:     Head: Normocephalic and atraumatic.  Pulmonary:     Effort: Pulmonary effort is normal. No respiratory distress.  Musculoskeletal:     Cervical back: Normal range of motion and neck supple.  Neurological:     Mental Status: She is alert.  Psychiatric:        Mood and Affect: Mood normal.        Behavior: Behavior normal.        Thought Content: Thought content normal.        Judgment: Judgment normal.         Assessment & Plan:    1. Pneumonia of right lower lobe due to infectious organism Suspect possible atypical pneumonia. Will treat with Zpak and prednisone as below. Continue inhalers prn. Rest. Push fluids. Call if worsening.  - azithromycin (ZITHROMAX) 250 MG tablet; Take 2 tablets PO on day one, and one tablet PO daily thereafter until completed.  Dispense: 6 tablet; Refill: 0 - predniSONE (STERAPRED UNI-PAK 21 TAB) 10 MG (21) TBPK tablet; 6 day taper; take as directed on package instructions  Dispense: 21 tablet; Refill: 0  2. Mild persistent asthma without complication See above medical treatment plan. - predniSONE (STERAPRED UNI-PAK 21 TAB) 10 MG (21) TBPK tablet; 6  day taper; take as directed on package instructions  Dispense: 21 tablet; Refill: 0  No follow-ups on file.     I discussed the assessment and treatment plan with the patient. The patient was provided an opportunity to ask questions and all were answered. The patient agreed with the plan and demonstrated an understanding of the instructions.   The patient was advised to call back or seek an in-person evaluation if the symptoms worsen or if the condition fails to improve as anticipated.  I provided 10 minutes of non-face-to-face time during this encounter.  Reynolds Bowl, PA-C, have reviewed all documentation for this visit. The documentation on 10/04/19 for the exam, diagnosis, procedures, and orders are all accurate and complete.  [place] [facility] [phone] (phone) [phone] (fax)  [employer]

## 2019-10-21 ENCOUNTER — Other Ambulatory Visit: Payer: Self-pay | Admitting: Physician Assistant

## 2019-10-21 DIAGNOSIS — F900 Attention-deficit hyperactivity disorder, predominantly inattentive type: Secondary | ICD-10-CM

## 2019-10-21 MED ORDER — AMPHETAMINE-DEXTROAMPHET ER 15 MG PO CP24: 15.0000 mg | ORAL_CAPSULE | ORAL | 0 refills | Status: DC

## 2019-10-21 NOTE — Telephone Encounter (Signed)
Copied from [place] [id number]. Topic: Quick Communication - Rx Refill/Question >> Oct 21, 2019  9:36 AM Rainey Pines A wrote: Medication:amphetamine-dextroamphetamine (ADDERALL XR) 15 MG 24 hr capsule  Has the patient contacted their pharmacy? yes (Agent: If no, request that the patient contact the pharmacy for the refill.) (Agent: If yes, when and what did the pharmacy advise?)contact PCP  Preferred Pharmacy (with phone number or street name):[facility], [place] - [place]  Phone:  [phone] Fax:  [phone]     Agent: Please be advised that RX refills may take up to 3 business days. We ask that you follow-up with your pharmacy.

## 2019-10-21 NOTE — Telephone Encounter (Signed)
Requested medication (s) are due for refill today -yes  Requested medication (s) are on the active medication list -yes  Future visit scheduled -no  Last refill: 09/20/19  Notes to clinic: Request for non delegated Rx  Requested Prescriptions  Pending Prescriptions Disp Refills   amphetamine-dextroamphetamine (ADDERALL XR) 15 MG 24 hr capsule 30 capsule 0    Sig: Take 1 capsule by mouth every morning.      Not Delegated - Psychiatry:  Stimulants/ADHD Failed - 10/21/2019  9:38 AM      Failed - This refill cannot be delegated      Failed - Urine Drug Screen completed in last 360 days.      Passed - Valid encounter within last 3 months    Recent Outpatient Visits           2 weeks ago Pneumonia of right lower lobe due to infectious organism   [place], [place], [place]   1 month ago Attention deficit hyperactivity disorder (ADHD), predominantly inattentive type   [facility], [place], [place]   2 months ago Acute non-recurrent frontal sinusitis   [facility] [place], [place]   6 months ago Irregular bleeding   [facility] [place], Dionne Bucy, [place]   6 months ago Encounter for IUD insertion   [facility], Dionne Bucy, MD                  Requested Prescriptions  Pending Prescriptions Disp Refills   amphetamine-dextroamphetamine (ADDERALL XR) 15 MG 24 hr capsule 30 capsule 0    Sig: Take 1 capsule by mouth every morning.      Not Delegated - Psychiatry:  Stimulants/ADHD Failed - 10/21/2019  9:38 AM      Failed - This refill cannot be delegated      Failed - Urine Drug Screen completed in last 360 days.      Passed - Valid encounter within last 3 months    Recent Outpatient Visits           2 weeks ago Pneumonia of right lower lobe due to infectious organism   [place], [place], [place]   1 month ago Attention deficit hyperactivity  disorder (ADHD), predominantly inattentive type   [place], [place]   2 months ago Acute non-recurrent frontal sinusitis   [facility] [place], [place]   6 months ago Irregular bleeding   [facility] [place], Dionne Bucy, MD   6 months ago Encounter for IUD insertion   [facility], Dionne Bucy, MD

## 2019-11-23 LAB — CBC WITH DIFFERENTIAL/PLATELET
Basophils Absolute: 0 10*3/uL (ref 0.0–0.2)
Basos: 0 %
EOS (ABSOLUTE): 0.1 10*3/uL (ref 0.0–0.4)
Eos: 1 %
Hematocrit: 40.7 % (ref 34.0–46.6)
Hemoglobin: 13.7 g/dL (ref 11.1–15.9)
Immature Grans (Abs): 0 10*3/uL (ref 0.0–0.1)
Immature Granulocytes: 0 %
Lymphocytes Absolute: 1.8 10*3/uL (ref 0.7–3.1)
Lymphs: 26 %
MCH: 31.6 pg (ref 26.6–33.0)
MCHC: 33.7 g/dL (ref 31.5–35.7)
MCV: 94 fL (ref 79–97)
Monocytes Absolute: 0.6 10*3/uL (ref 0.1–0.9)
Monocytes: 8 %
Neutrophils Absolute: 4.3 10*3/uL (ref 1.4–7.0)
Neutrophils: 65 %
Platelets: 195 10*3/uL (ref 150–450)
RBC: 4.34 x10E6/uL (ref 3.77–5.28)
RDW: 12.2 % (ref 11.7–15.4)
WBC: 6.8 10*3/uL (ref 3.4–10.8)

## 2019-11-23 LAB — IRON,TIBC AND FERRITIN PANEL
Ferritin: 72 ng/mL (ref 15–150)
Iron Saturation: 38 % (ref 15–55)
Iron: 113 ug/dL (ref 27–159)
Total Iron Binding Capacity: 301 ug/dL (ref 250–450)
UIBC: 188 ug/dL (ref 131–425)

## 2019-11-23 LAB — VITAMIN D 25 HYDROXY (VIT D DEFICIENCY, FRACTURES): Vit D, 25-Hydroxy: 20.7 ng/mL — ABNORMAL LOW (ref 30.0–100.0)

## 2019-11-23 LAB — TSH+FREE T4
Free T4: 1.38 ng/dL (ref 0.82–1.77)
TSH: 1.35 u[IU]/mL (ref 0.450–4.500)

## 2019-11-23 LAB — B12 AND FOLATE PANEL
Folate: 11.9 ng/mL (ref >3.0)
Vitamin B-12: 335 pg/mL (ref 232–1245)

## 2019-11-25 ENCOUNTER — Telehealth: Payer: Self-pay

## 2019-11-25 NOTE — Telephone Encounter (Signed)
Patient advised as directed below. 

## 2019-11-25 NOTE — Telephone Encounter (Signed)
-----   Message from Mar Daring, [place] sent at 11/25/2019  1:40 PM EDT ----- Blood count is normal. Thyroid is normal. Iron levels are normal. Vit D is borderline low. Would recommend Vit D OTC 2000 IU daily. B12 is borderline low. Could benefit from oral B12 OTC supplement also.

## 2019-12-05 ENCOUNTER — Other Ambulatory Visit: Payer: Self-pay | Admitting: Physician Assistant

## 2019-12-05 DIAGNOSIS — F411 Generalized anxiety disorder: Secondary | ICD-10-CM

## 2019-12-05 DIAGNOSIS — F3341 Major depressive disorder, recurrent, in partial remission: Secondary | ICD-10-CM

## 2019-12-05 NOTE — Telephone Encounter (Signed)
Requested medication (s) are due for refill today - yes  Requested medication (s) are on the active medication list -yes  Future visit scheduled -no  Last refill: 07/08/18 RF5  Notes to clinic: Request for non delegated Rx  Requested Prescriptions  Pending Prescriptions Disp Refills   ALPRAZolam (XANAX) 0.5 MG tablet [Pharmacy Med Name: ALPRAZOLAM 0.5 MG TAB] 60 tablet     Sig: TAKE 1/2 TO 1 TABLET TWICE DAILY AS NEEDED FOR ANXIETY      Not Delegated - Psychiatry:  Anxiolytics/Hypnotics Failed - 12/05/2019 12:26 PM      Failed - This refill cannot be delegated      Failed - Urine Drug Screen completed in last 360 days.      Passed - Valid encounter within last 6 months    Recent Outpatient Visits           2 months ago Pneumonia of right lower lobe due to infectious organism   [place], [place], [place]   2 months ago Attention deficit hyperactivity disorder (ADHD), predominantly inattentive type   [facility], [place], [place]   3 months ago Acute non-recurrent frontal sinusitis   [facility] [place] [place], [place]   7 months ago Irregular bleeding   [facility] [place], Dionne Bucy, MD   7 months ago Encounter for IUD insertion   [facility], Dionne Bucy, MD                  Requested Prescriptions  Pending Prescriptions Disp Refills   ALPRAZolam (XANAX) 0.5 MG tablet [Pharmacy Med Name: ALPRAZOLAM 0.5 MG TAB] 60 tablet     Sig: TAKE 1/2 TO 1 TABLET TWICE DAILY AS NEEDED FOR ANXIETY      Not Delegated - Psychiatry:  Anxiolytics/Hypnotics Failed - 12/05/2019 12:26 PM      Failed - This refill cannot be delegated      Failed - Urine Drug Screen completed in last 360 days.      Passed - Valid encounter within last 6 months    Recent Outpatient Visits           2 months ago Pneumonia of right lower lobe due to infectious organism   [place], [place], [place]   2 months ago Attention deficit hyperactivity disorder (ADHD), predominantly inattentive type   [place], [place]   3 months ago Acute non-recurrent frontal sinusitis   [place], [place]   7 months ago Irregular bleeding   [place], Dionne Bucy, [place]   7 months ago Encounter for IUD insertion   [facility], Dionne Bucy, MD

## 2019-12-17 ENCOUNTER — Other Ambulatory Visit: Payer: Self-pay | Admitting: Physician Assistant

## 2019-12-17 DIAGNOSIS — F900 Attention-deficit hyperactivity disorder, predominantly inattentive type: Secondary | ICD-10-CM

## 2019-12-17 NOTE — Telephone Encounter (Signed)
Requested medication (s) are due for refill today: yes  Requested medication (s) are on the active medication list: yes  Last refill: 10/21/19  Future visit scheduled: no  Notes to clinic:  not delegated    Requested Prescriptions  Pending Prescriptions Disp Refills   amphetamine-dextroamphetamine (ADDERALL XR) 15 MG 24 hr capsule [Pharmacy Med Name: AMPHETAMINE-DEXTROAMPHET ER 15 MG C] 30 capsule     Sig: Take 1 capsule by mouth every morning.      Not Delegated - Psychiatry:  Stimulants/ADHD Failed - 12/17/2019  2:27 PM      Failed - This refill cannot be delegated      Failed - Urine Drug Screen completed in last 360 days.      Passed - Valid encounter within last 3 months    Recent Outpatient Visits           2 months ago Pneumonia of right lower lobe due to infectious organism   [place], [place], [place]   2 months ago Attention deficit hyperactivity disorder (ADHD), predominantly inattentive type   [place], [place]   3 months ago Acute non-recurrent frontal sinusitis   [place], [place]   7 months ago Irregular bleeding   [facility], Dionne Bucy, MD   8 months ago Encounter for IUD insertion   [facility], Dionne Bucy, MD

## 2020-02-10 ENCOUNTER — Other Ambulatory Visit: Payer: Self-pay | Admitting: Physician Assistant

## 2020-02-10 DIAGNOSIS — F900 Attention-deficit hyperactivity disorder, predominantly inattentive type: Secondary | ICD-10-CM

## 2020-02-10 NOTE — Telephone Encounter (Signed)
Requested medication (s) are due for refill today   Yes  Requested medication (s) are on the active medication list   Yes.  Last ordered 12/17/19  Future visit scheduled No. Last visit 10/04/19. Last visit medication was reviewed 09/20/19.  Note to clinic-This medication not delegated for [facility] to refill.   Requested Prescriptions  Pending Prescriptions Disp Refills   amphetamine-dextroamphetamine (ADDERALL XR) 15 MG 24 hr capsule 30 capsule 0    Sig: Take 1 capsule by mouth every morning.      Not Delegated - Psychiatry:  Stimulants/ADHD Failed - 02/10/2020  4:24 PM      Failed - This refill cannot be delegated      Failed - Urine Drug Screen completed in last 360 days.      Failed - Valid encounter within last 3 months    Recent Outpatient Visits           4 months ago Pneumonia of right lower lobe due to infectious organism   [place], [place], [place]   4 months ago Attention deficit hyperactivity disorder (ADHD), predominantly inattentive type   [place], [place]   5 months ago Acute non-recurrent frontal sinusitis   [place], [place]   9 months ago Irregular bleeding   [facility] [place], Dionne Bucy, MD   10 months ago Encounter for IUD insertion   [facility], Dionne Bucy, MD

## 2020-02-10 NOTE — Telephone Encounter (Signed)
Medication Refill - Medication: amphetamine-dextroamphetamine (ADDERALL XR) 15 MG 24 hr capsule   Has the patient contacted their pharmacy? Yes.   (Agent: If no, request that the patient contact the pharmacy for the refill.) (Agent: If yes, when and what did the pharmacy advise?)  Preferred Pharmacy (with phone number or street name):  [place], [place] - [place]  [place] [address]  Phone: [phone] Fax: [phone]     Agent: Please be advised that RX refills may take up to 3 business days. We ask that you follow-up with your pharmacy.

## 2020-02-11 MED ORDER — AMPHETAMINE-DEXTROAMPHET ER 15 MG PO CP24: 15.0000 mg | ORAL_CAPSULE | ORAL | 0 refills | Status: DC

## 2020-04-07 NOTE — Progress Notes (Signed)
MyChart Video Visit    Virtual Visit via Video Note   This visit type was conducted due to national recommendations for restrictions regarding the COVID-19 Pandemic (e.g. social distancing) in an effort to limit this patient's exposure and mitigate transmission in our community. This patient is at least at moderate risk for complications without adequate follow up. This format is felt to be most appropriate for this patient at this time. Physical exam was limited by quality of the video and audio technology used for the visit.   Patient location: Car safely parked Provider location: [facility]  I discussed the limitations of evaluation and management by telemedicine and the availability of in person appointments. The patient expressed understanding and agreed to proceed.  Patient: Wendy [name]   DOB: [birth year]   44 y.o. Female  MRN: [id number] Visit Date: 04/08/2020  Today's healthcare provider: Mar Daring, PA-C   No chief complaint on file.  Subjective    URI  This is a new problem. The current episode started 1 to 4 weeks ago. The problem has been gradually worsening. There has been no fever. Associated symptoms include congestion, coughing, ear pain, headaches, a plugged ear sensation, rhinorrhea, sinus pain, a sore throat and swollen glands. Pertinent negatives include no abdominal pain, chest pain, diarrhea, dysuria, joint pain, joint swelling, nausea, neck pain, sneezing, vomiting or wheezing. She has tried acetaminophen, antihistamine, increased fluids and decongestant for the symptoms. The treatment provided mild relief.     Patient Active Problem List   Diagnosis Date Noted  . GAD (generalized anxiety disorder) 11/05/2015  . ADHD (attention deficit hyperactivity disorder) 11/05/2015  . Obese 11/05/2015  . Urinary incontinence in female 11/05/2015  . Constipation 11/05/2015  . Menstrual migraine 11/05/2015  . Contraception management 05/02/2012  . Depression  02/13/2012  . Abnormal Pap smear of cervix 10/19/2011   Past Medical History:  Diagnosis Date  . Abnormal Pap smear of cervix 10/2011  . Allergy   . Anxiety   . Depression   . Heart murmur    slight      Medications: Outpatient Medications Prior to Visit  Medication Sig  . ALPRAZolam (XANAX) 0.5 MG tablet TAKE 1/2 TO 1 TABLET TWICE DAILY AS NEEDED FOR ANXIETY  . amphetamine-dextroamphetamine (ADDERALL XR) 15 MG 24 hr capsule Take 1 capsule by mouth every morning.  Marland Kitchen azithromycin (ZITHROMAX) 250 MG tablet Take 2 tablets PO on day one, and one tablet PO daily thereafter until completed.  . Cholecalciferol (VITAMIN D PO) Take by mouth.  . fluticasone furoate-vilanterol (BREO ELLIPTA) 100-25 MCG/INH AEPB Inhale 1 puff into the lungs daily.  Marland Kitchen MAGNESIUM CARBONATE PO Take by mouth.  . Multiple Vitamin (MULTI VITAMIN DAILY PO) Take by mouth.  . predniSONE (STERAPRED UNI-PAK 21 TAB) 10 MG (21) TBPK tablet 6 day taper; take as directed on package instructions  . PROAIR HFA 108 (90 Base) MCG/ACT inhaler INHALE 2 PUFFS INTO THE LUNGS EVERY 4-6 HOURS AS NEEDED  . tretinoin (RETIN-A) 0.1 % cream   . vortioxetine HBr (TRINTELLIX) 20 MG TABS tablet Take 1 tablet (20 mg total) by mouth daily.   No facility-administered medications prior to visit.    Review of Systems  HENT: Positive for congestion, ear pain, rhinorrhea, sinus pain and sore throat. Negative for sneezing.   Respiratory: Positive for cough. Negative for wheezing.   Cardiovascular: Negative for chest pain.  Gastrointestinal: Negative for abdominal pain, diarrhea, nausea and vomiting.  Genitourinary: Negative for dysuria.  Musculoskeletal: Negative  for joint pain and neck pain.  Neurological: Positive for headaches.    Last CBC Lab Results  Component Value Date   WBC 6.8 11/22/2019   HGB 13.7 11/22/2019   HCT 40.7 11/22/2019   MCV 94 11/22/2019   MCH 31.6 11/22/2019   RDW 12.2 11/22/2019   PLT 195 33/35/4562   Last  metabolic panel Lab Results  Component Value Date   GLUCOSE 95 03/14/2019   NA 140 03/14/2019   K 4.3 03/14/2019   CL 103 03/14/2019   CO2 23 03/14/2019   BUN 16 03/14/2019   CREATININE 0.74 03/14/2019   GFRNONAA 100 03/14/2019   GFRAA 115 03/14/2019   CALCIUM 9.8 03/14/2019   PROT 6.3 03/14/2019   ALBUMIN 4.3 03/14/2019   LABGLOB 2.0 03/14/2019   AGRATIO 2.2 03/14/2019   BILITOT 0.3 03/14/2019   ALKPHOS 80 03/14/2019   AST 18 03/14/2019   ALT 14 03/14/2019      Objective    There were no vitals taken for this visit. BP Readings from Last 3 Encounters:  04/24/19 107/77  04/11/19 95/70  04/05/19 100/70   Wt Readings from Last 3 Encounters:  04/24/19 190 lb (86.2 kg)  04/11/19 192 lb (87.1 kg)  04/05/19 190 lb (86.2 kg)      Physical Exam     Assessment & Plan     1. Acute non-recurrent pansinusitis Worsening symptoms that have not responded to OTC medications. Will give augmentin as below. Continue allergy medications. Stay well hydrated and get plenty of rest. Call if no symptom improvement or if symptoms worsen. - amoxicillin-clavulanate (AUGMENTIN) 875-125 MG tablet; Take 1 tablet by mouth 2 (two) times daily.  Dispense: 20 tablet; Refill: 0  2. Mild persistent asthma without complication Will give prednisone as below for asthma exacerbation with post nasal drainage.  - predniSONE (STERAPRED UNI-PAK 21 TAB) 10 MG (21) TBPK tablet; 6 day taper; take as directed on package instructions  Dispense: 21 tablet; Refill: 0  3. Antibiotic-induced yeast infection Gets yeast infections with antibiotics. Diflucan given as below.  - fluconazole (DIFLUCAN) 150 MG tablet; Take 1 tablet (150 mg total) by mouth once for 1 dose.  Dispense: 1 tablet; Refill: 0   No follow-ups on file.     I discussed the assessment and treatment plan with the patient. The patient was provided an opportunity to ask questions and all were answered. The patient agreed with the plan and  demonstrated an understanding of the instructions.   The patient was advised to call back or seek an in-person evaluation if the symptoms worsen or if the condition fails to improve as anticipated.  I provided 13 minutes of face-to-face time during this encounter via MyChart Video enabled encounter.  Reynolds Bowl, PA-C, have reviewed all documentation for this visit. The documentation on 04/09/20 for the exam, diagnosis, procedures, and orders are all accurate and complete.  [place] [facility] [phone] (phone) [phone] (fax)  [place]

## 2020-04-08 ENCOUNTER — Telehealth (INDEPENDENT_AMBULATORY_CARE_PROVIDER_SITE_OTHER): Payer: 59 | Admitting: Physician Assistant

## 2020-04-08 DIAGNOSIS — J4530 Mild persistent asthma, uncomplicated: Secondary | ICD-10-CM

## 2020-04-08 DIAGNOSIS — B379 Candidiasis, unspecified: Secondary | ICD-10-CM | POA: Diagnosis not present

## 2020-04-08 DIAGNOSIS — T3695XA Adverse effect of unspecified systemic antibiotic, initial encounter: Secondary | ICD-10-CM

## 2020-04-08 DIAGNOSIS — J0140 Acute pansinusitis, unspecified: Secondary | ICD-10-CM

## 2020-04-08 MED ORDER — AMOXICILLIN-POT CLAVULANATE 875-125 MG PO TABS: 1.0000 | ORAL_TABLET | Freq: Two times a day (BID) | ORAL | 0 refills | Status: DC

## 2020-04-08 MED ORDER — FLUCONAZOLE 150 MG PO TABS: 150.0000 mg | ORAL_TABLET | Freq: Once | ORAL | 0 refills | Status: AC

## 2020-04-08 MED ORDER — PREDNISONE 10 MG (21) PO TBPK: ORAL_TABLET | ORAL | 0 refills | Status: DC

## 2020-04-09 ENCOUNTER — Encounter: Payer: Self-pay | Admitting: Physician Assistant

## 2020-04-11 ENCOUNTER — Other Ambulatory Visit: Payer: Self-pay | Admitting: Physician Assistant

## 2020-04-11 DIAGNOSIS — J4520 Mild intermittent asthma, uncomplicated: Secondary | ICD-10-CM

## 2020-04-11 NOTE — Telephone Encounter (Signed)
Requested Prescriptions  Pending Prescriptions Disp Refills   PROAIR HFA 108 (90 Base) MCG/ACT inhaler [Pharmacy Med Name: PROAIR HFA 108 (90 BASE) MCG/ACT IN] 8.5 g 0    Sig: INHALE 2 PUFFS INTO THE LUNGS EVERY 4-6 HOURS AS NEEDED     Pulmonology:  Beta Agonists Failed - 04/11/2020  1:25 PM      Failed - One inhaler should last at least one month. If the patient is requesting refills earlier, contact the patient to check for uncontrolled symptoms.      Passed - Valid encounter within last 12 months    Recent Outpatient Visits          3 days ago Acute non-recurrent pansinusitis   [place], [place]   6 months ago Pneumonia of right lower lobe due to infectious organism   [facility], [place], [place]   6 months ago Attention deficit hyperactivity disorder (ADHD), predominantly inattentive type   [place], [place]   7 months ago Acute non-recurrent frontal sinusitis   [place], [place]   11 months ago Irregular bleeding   [facility] [place], Dionne Bucy, MD      Future Appointments            In 1 month [place], Clearnce Sorrel, PA-C [employer], [facility]

## 2020-04-20 ENCOUNTER — Telehealth: Payer: Self-pay

## 2020-04-20 DIAGNOSIS — J4530 Mild persistent asthma, uncomplicated: Secondary | ICD-10-CM

## 2020-04-20 DIAGNOSIS — J189 Pneumonia, unspecified organism: Secondary | ICD-10-CM

## 2020-04-20 MED ORDER — PREDNISONE 10 MG (21) PO TBPK: ORAL_TABLET | ORAL | 0 refills | Status: DC

## 2020-04-20 MED ORDER — BENZONATATE 200 MG PO CAPS: 200.0000 mg | ORAL_CAPSULE | Freq: Three times a day (TID) | ORAL | 0 refills | Status: DC | PRN

## 2020-04-20 MED ORDER — AZITHROMYCIN 250 MG PO TABS: ORAL_TABLET | ORAL | 0 refills | Status: DC

## 2020-04-20 NOTE — Telephone Encounter (Signed)
Copied from [place] [id number] [id number]. Topic: General - Other >> Apr 20, 2020 12:00 PM Leward Quan A wrote: Reason for CRM: Patient mom Wendy [name] called in to inform Fenton Malling that patient has taken all the prescribed medication but still have a cough that has not gone worst at night but consistent through out the day. Please advise call Wendy [name] Ph# [phone]

## 2020-04-20 NOTE — Telephone Encounter (Signed)
Patient's mother advised as directed below.

## 2020-04-20 NOTE — Addendum Note (Signed)
Addended by: Mar Daring on: 04/20/2020 04:44 PM   Modules accepted: Orders

## 2020-04-20 NOTE — Telephone Encounter (Signed)
Sent in [place], another round of prednisone and tessalon perles to [employer].

## 2020-06-02 NOTE — Progress Notes (Signed)
Complete physical exam   Patient: Wendy [name]   DOB: 01-12-1976   44 y.o. Female  MRN: [id number] Visit Date: 06/03/2020  Today's healthcare provider: Mar Daring, PA-C   Chief Complaint  Patient presents with  . Annual Exam   Subjective    Wendy [name] is a 44 y.o. female who presents today for a complete physical exam.  She reports consuming a general diet. Gym/ health club routine includes cardio and light weights. She generally feels well. She reports sleeping fairly well. She does have additional problems to discuss today.  HPI  03/14/19-Pap is normal, HPV negative. Will repeat in 3-5 years. 07/15/2019-Normal mammogram. Repeat screening in one year  Declined Influenza Vaccine.  Having issues with weight gain. Wants to have IUD removed but had placed due to heavy menstrual cycles. Wants to have removed and see if she may be a candidate for ablation or other non-hormonal treatment.   Also had been noticing her asthma was getting a little worse. She realized she had been using her Breo inhaler empty. She also converted to a face shield over using a face mask at work. Those changes have been helping. She is also taking voice lessons and doing breathing exercises.   Past Medical History:  Diagnosis Date  . Abnormal Pap smear of cervix 10/2011  . Allergy   . Anxiety   . Depression   . Heart murmur    slight   Past Surgical History:  Procedure Laterality Date  . CESAREAN SECTION  2004   x1  . LAPAROSCOPIC TUBAL LIGATION  05/02/2012   Procedure: LAPAROSCOPIC TUBAL LIGATION;  Surgeon: Emily Filbert, MD;  Location: [facility];  Service: Gynecology;  Laterality: Bilateral;  with filshie clips  . TUBAL LIGATION  04/2012  . WISDOM TOOTH EXTRACTION     x4  . WRIST SURGERY     rigth cyst removal.   Social History   Socioeconomic History  . Marital status: Married    Spouse name: Not on file  . Number of children: 2  . Years of education: Not on file  . Highest  education level: Not on file  Occupational History  . Not on file  Tobacco Use  . Smoking status: Never Smoker  . Smokeless tobacco: Never Used  Vaping Use  . Vaping Use: Never used  Substance and Sexual Activity  . Alcohol use: Yes    Comment: occasional/rare  . Drug use: No  . Sexual activity: Yes    Partners: Male    Birth control/protection: Surgical    Comment: Tubaligation  Other Topics Concern  . Not on file  Social History Narrative  . Not on file   Social Determinants of Health   Financial Resource Strain: Not on file  Food Insecurity: Not on file  Transportation Needs: Not on file  Physical Activity: Not on file  Stress: Not on file  Social Connections: Not on file  Intimate Partner Violence: Not on file   Family Status  Relation Name Status  . Mother  Alive  . Father  Deceased       07-15-2019 from COVID-19  . MGM  Alive  . MGF  Deceased  . PGM  Alive  . PGF  Deceased  . Daughter  Alive  . Daughter  Alive  . Neg Hx  (Not Specified)   Family History  Problem Relation Age of Onset  . Arthritis Mother   . Hypothyroidism Mother   . Heart disease  Father        heart attack  . Alcohol abuse Father   . Heart disease Maternal Grandmother   . Emphysema Maternal Grandfather   . Heart disease Maternal Grandfather   . Asthma Paternal Grandmother   . Heart disease Paternal Grandfather   . Breast cancer Neg Hx    Allergies  Allergen Reactions  . Peanuts [Peanut Oil] Shortness Of Breath  . Banana Itching  . Other Other (See Comments)    Walnuts- makes tongue blister  . Codeine Nausea And Vomiting  . Eggs Or Egg-Derived Products Nausea And Vomiting  . Vicodin [Hydrocodone-Acetaminophen] Nausea And Vomiting    Patient Care Team: Rubye Beach as PCP - General (Family Medicine)   Medications: Outpatient Medications Prior to Visit  Medication Sig  . ALPRAZolam (XANAX) 0.5 MG tablet TAKE 1/2 TO 1 TABLET TWICE DAILY AS NEEDED FOR ANXIETY  .  amphetamine-dextroamphetamine (ADDERALL XR) 15 MG 24 hr capsule Take 1 capsule by mouth every morning.  Marland Kitchen azithromycin (ZITHROMAX) 250 MG tablet Take 2 tablets PO on day one, and one tablet PO daily thereafter until completed.  . benzonatate (TESSALON) 200 MG capsule Take 1 capsule (200 mg total) by mouth 3 (three) times daily as needed.  . Cholecalciferol (VITAMIN D PO) Take by mouth.  . fluticasone furoate-vilanterol (BREO ELLIPTA) 100-25 MCG/INH AEPB Inhale 1 puff into the lungs daily.  Marland Kitchen MAGNESIUM CARBONATE PO Take by mouth.  . Multiple Vitamin (MULTI VITAMIN DAILY PO) Take by mouth.  . predniSONE (STERAPRED UNI-PAK 21 TAB) 10 MG (21) TBPK tablet 6 day taper; take as directed on package instructions  . PROAIR HFA 108 (90 Base) MCG/ACT inhaler INHALE 2 PUFFS INTO THE LUNGS EVERY 4-6 HOURS AS NEEDED  . tretinoin (RETIN-A) 0.1 % cream   . vortioxetine HBr (TRINTELLIX) 20 MG TABS tablet Take 1 tablet (20 mg total) by mouth daily.   No facility-administered medications prior to visit.    Review of Systems  Constitutional: Negative.   HENT: Negative.   Eyes: Negative.   Respiratory: Positive for shortness of breath.   Cardiovascular: Negative.   Gastrointestinal: Negative.   Endocrine: Negative.   Genitourinary: Negative.   Musculoskeletal: Negative.   Skin: Negative.   Allergic/Immunologic: Positive for environmental allergies and food allergies.  Neurological: Positive for headaches.  Hematological: Negative.   Psychiatric/Behavioral: Negative.     Last CBC Lab Results  Component Value Date   WBC 6.8 11/22/2019   HGB 13.7 11/22/2019   HCT 40.7 11/22/2019   MCV 94 11/22/2019   MCH 31.6 11/22/2019   RDW 12.2 11/22/2019   PLT 195 26/71/2458   Last metabolic panel Lab Results  Component Value Date   GLUCOSE 95 03/14/2019   NA 140 03/14/2019   K 4.3 03/14/2019   CL 103 03/14/2019   CO2 23 03/14/2019   BUN 16 03/14/2019   CREATININE 0.74 03/14/2019   GFRNONAA 100  03/14/2019   GFRAA 115 03/14/2019   CALCIUM 9.8 03/14/2019   PROT 6.3 03/14/2019   ALBUMIN 4.3 03/14/2019   LABGLOB 2.0 03/14/2019   AGRATIO 2.2 03/14/2019   BILITOT 0.3 03/14/2019   ALKPHOS 80 03/14/2019   AST 18 03/14/2019   ALT 14 03/14/2019      Objective    BP 107/77 (BP Location: Right Arm, Patient Position: Sitting, Cuff Size: Large)   Pulse 80   Temp 97.7 F (36.5 C) (Oral)   Resp 16   Ht 5\' 3"  (1.6 m)  Wt 209 lb 3.2 oz (94.9 kg)   BMI 37.06 kg/m  BP Readings from Last 3 Encounters:  06/03/20 107/77  04/24/19 107/77  04/11/19 95/70   Wt Readings from Last 3 Encounters:  06/03/20 209 lb 3.2 oz (94.9 kg)  04/24/19 190 lb (86.2 kg)  04/11/19 192 lb (87.1 kg)      Physical Exam Vitals reviewed.  Constitutional:      General: She is not in acute distress.    Appearance: Normal appearance. She is well-developed and well-nourished. She is obese. She is not ill-appearing or diaphoretic.  HENT:     Head: Normocephalic and atraumatic.     Right Ear: Tympanic membrane, ear canal and external ear normal.     Left Ear: Tympanic membrane, ear canal and external ear normal.     Nose: Nose normal.     Mouth/Throat:     Mouth: Oropharynx is clear and moist. Mucous membranes are moist.     Pharynx: Oropharynx is clear. No oropharyngeal exudate.  Eyes:     General: No scleral icterus.       Right eye: No discharge.        Left eye: No discharge.     Extraocular Movements: Extraocular movements intact and EOM normal.     Conjunctiva/sclera: Conjunctivae normal.     Pupils: Pupils are equal, round, and reactive to light.  Neck:     Thyroid: No thyromegaly.     Vascular: No JVD.     Trachea: No tracheal deviation.  Cardiovascular:     Rate and Rhythm: Normal rate and regular rhythm.     Pulses: Normal pulses and intact distal pulses.     Heart sounds: Normal heart sounds. No murmur heard. No friction rub. No gallop.   Pulmonary:     Effort: Pulmonary effort is  normal. No respiratory distress.     Breath sounds: Normal breath sounds. No wheezing or rales.  Chest:     Chest wall: No tenderness.  Breasts:     Right: Normal. No mass, skin change, tenderness, axillary adenopathy or supraclavicular adenopathy.     Left: Normal. No mass, skin change, tenderness, axillary adenopathy or supraclavicular adenopathy.    Abdominal:     General: Abdomen is flat. Bowel sounds are normal. There is no distension.     Palpations: Abdomen is soft. There is no mass.     Tenderness: There is no abdominal tenderness. There is no guarding or rebound.  Musculoskeletal:        General: No tenderness or edema. Normal range of motion.     Cervical back: Normal range of motion and neck supple. No tenderness.     Right lower leg: No edema.     Left lower leg: No edema.  Lymphadenopathy:     Cervical: No cervical adenopathy.     Upper Body:     Right upper body: No supraclavicular, axillary or pectoral adenopathy.     Left upper body: No supraclavicular, axillary or pectoral adenopathy.  Skin:    General: Skin is warm and dry.     Capillary Refill: Capillary refill takes less than 2 seconds.     Findings: No rash.  Neurological:     General: No focal deficit present.     Mental Status: She is alert and oriented to person, place, and time.  Psychiatric:        Mood and Affect: Mood and affect and mood normal.  Behavior: Behavior normal.        Thought Content: Thought content normal.        Judgment: Judgment normal.       Last depression screening scores PHQ 2/9 Scores 06/03/2020 09/20/2019 03/14/2019  PHQ - 2 Score 2 6 1   PHQ- 9 Score 6 20 10    Last fall risk screening Fall Risk  01/25/2018  Falls in the past year? No   Last Audit-C alcohol use screening Alcohol Use Disorder Test (AUDIT) 06/03/2020  1. How often do you have a drink containing alcohol? 3  2. How many drinks containing alcohol do you have on a typical day when you are drinking? 0  3.  How often do you have six or more drinks on one occasion? 0  AUDIT-C Score 3  Alcohol Brief Interventions/Follow-up AUDIT Score <7 follow-up not indicated   A score of 3 or more in women, and 4 or more in men indicates increased risk for alcohol abuse, EXCEPT if all of the points are from question 1   No results found for any visits on 06/03/20.  Assessment & Plan    Routine Health Maintenance and Physical Exam  Exercise Activities and Dietary recommendations Goals   None     Immunization History  Administered Date(s) Administered  . Influenza,inj,Quad PF,6+ Mos 03/14/2019  . Influenza-Unspecified 03/20/2018  . Tdap 11/18/2016    Health Maintenance  Topic Date Due  . COVID-19 Vaccine (1) Never done  . INFLUENZA VACCINE  01/19/2020  . PAP SMEAR-Modifier  03/13/2020  . TETANUS/TDAP  11/19/2026  . Hepatitis C Screening  Completed  . HIV Screening  Completed    Discussed health benefits of physical activity, and encouraged her to engage in regular exercise appropriate for her age and condition.  1. Annual physical exam Normal physical exam today. Will check labs as below and f/u pending lab results. If labs are stable and WNL she will not need to have these rechecked for one year at her next annual physical exam. She is to call the office in the meantime if she has any acute issue, questions or concerns. - CBC with Differential/Platelet - Comprehensive metabolic panel - Hemoglobin A1c - Lipid panel - FSH/LH - T4 AND TSH  2. Encounter for breast cancer screening using non-mammogram modality Breast exam today was normal. There is no family history of breast cancer. She does perform regular self breast exams. Mammogram was ordered as below. Information for [facility] Breast clinic was given to patient so she may schedule her mammogram at her convenience. - MM 3D SCREEN BREAST BILATERAL; Future  3. Class 2 severe obesity due to excess calories with serious comorbidity and body  mass index (BMI) of 37.0 to 37.9 in adult [facility]) Counseled patient on healthy lifestyle modifications including dieting and exercise. Will check labs as below and f/u pending results. - CBC with Differential/Platelet - Comprehensive metabolic panel - Hemoglobin A1c - Lipid panel - FSH/LH - T4 AND TSH  4. Recurrent major depressive disorder, in partial remission ([place]) Patient stopped Trintellix. Wanting to try an herbal supplement. Will check labs as below and f/u pending results. - FSH/LH - T4 AND TSH  5. Slurred speech Patient reports her daughter has mentioned her slurring her words more than once. Will check for vitamin deficiency as below.  - Vitamin D (25 hydroxy) - B12 and Folate Panel  6. Menorrhagia with irregular cycle Has IUD but interested in removal. Referral placed to GYN for removal and consideration of  other treatments, such as ablation, for cessation of menstrual cycles.  - Ambulatory referral to Gynecology   No follow-ups on file.     Reynolds Bowl, PA-C, have reviewed all documentation for this visit. The documentation on 06/03/20 for the exam, diagnosis, procedures, and orders are all accurate and complete.   Rubye Beach  [facility] [phone] (phone) [phone] (fax)  [place]

## 2020-06-03 ENCOUNTER — Encounter: Payer: Self-pay | Admitting: Physician Assistant

## 2020-06-03 ENCOUNTER — Ambulatory Visit (INDEPENDENT_AMBULATORY_CARE_PROVIDER_SITE_OTHER): Payer: 59 | Admitting: Physician Assistant

## 2020-06-03 ENCOUNTER — Other Ambulatory Visit: Payer: Self-pay

## 2020-06-03 VITALS — BP 107/77 | HR 80 | Temp 97.7°F | Resp 16 | Ht 63.0 in | Wt 209.2 lb

## 2020-06-03 DIAGNOSIS — N921 Excessive and frequent menstruation with irregular cycle: Secondary | ICD-10-CM

## 2020-06-03 DIAGNOSIS — Z1239 Encounter for other screening for malignant neoplasm of breast: Secondary | ICD-10-CM

## 2020-06-03 DIAGNOSIS — R4781 Slurred speech: Secondary | ICD-10-CM

## 2020-06-03 DIAGNOSIS — F3341 Major depressive disorder, recurrent, in partial remission: Secondary | ICD-10-CM

## 2020-06-03 DIAGNOSIS — Z6837 Body mass index (BMI) 37.0-37.9, adult: Secondary | ICD-10-CM

## 2020-06-03 DIAGNOSIS — Z0000 Encounter for general adult medical examination without abnormal findings: Secondary | ICD-10-CM | POA: Diagnosis not present

## 2020-06-03 NOTE — Patient Instructions (Signed)
[facility] [address] [place],  [place]  [address] Main: [phone]   Preventive Care 40-44 Years Old, Female Preventive care refers to visits with your health care provider and lifestyle choices that can promote health and wellness. This includes:  A yearly physical exam. This may also be called an annual well check.  Regular dental visits and eye exams.  Immunizations.  Screening for certain conditions.  Healthy lifestyle choices, such as eating a healthy diet, getting regular exercise, not using drugs or products that contain nicotine and tobacco, and limiting alcohol use. What can I expect for my preventive care visit? Physical exam Your health care provider will check your:  Height and weight. This may be used to calculate body mass index (BMI), which tells if you are at a healthy weight.  Heart rate and blood pressure.  Skin for abnormal spots. Counseling Your health care provider may ask you questions about your:  Alcohol, tobacco, and drug use.  Emotional well-being.  Home and relationship well-being.  Sexual activity.  Eating habits.  Work and work environment.  Method of birth control.  Menstrual cycle.  Pregnancy history. What immunizations do I need?  Influenza (flu) vaccine  This is recommended every year. Tetanus, diphtheria, and pertussis (Tdap) vaccine  You may need a Td booster every 10 years. Varicella (chickenpox) vaccine  You may need this if you have not been vaccinated. Zoster (shingles) vaccine  You may need this after age 60. Measles, mumps, and rubella (MMR) vaccine  You may need at least one dose of MMR if you were born in 1957 or later. You may also need a second dose. Pneumococcal conjugate (PCV13) vaccine  You may need this if you have certain conditions and were not previously vaccinated. Pneumococcal polysaccharide (PPSV23) vaccine  You may need one or two doses if you smoke  cigarettes or if you have certain conditions. Meningococcal conjugate (MenACWY) vaccine  You may need this if you have certain conditions. Hepatitis A vaccine  You may need this if you have certain conditions or if you travel or work in places where you may be exposed to hepatitis A. Hepatitis B vaccine  You may need this if you have certain conditions or if you travel or work in places where you may be exposed to hepatitis B. Haemophilus influenzae type b (Hib) vaccine  You may need this if you have certain conditions. Human papillomavirus (HPV) vaccine  If recommended by your health care provider, you may need three doses over 6 months. You may receive vaccines as individual doses or as more than one vaccine together in one shot (combination vaccines). Talk with your health care provider about the risks and benefits of combination vaccines. What tests do I need? Blood tests  Lipid and cholesterol levels. These may be checked every 5 years, or more frequently if you are over 50 years old.  Hepatitis C test.  Hepatitis B test. Screening  Lung cancer screening. You may have this screening every year starting at age 55 if you have a 30-pack-year history of smoking and currently smoke or have quit within the past 15 years.  Colorectal cancer screening. All adults should have this screening starting at age 50 and continuing until age 75. Your health care provider may recommend screening at age 45 if you are at increased risk. You will have tests every 1-10 years, depending on your results and the type of screening test.  Diabetes screening. This is   done by checking your blood sugar (glucose) after you have not eaten for a while (fasting). You may have this done every 1-3 years.  Mammogram. This may be done every 1-2 years. Talk with your health care provider about when you should start having regular mammograms. This may depend on whether you have a family history of breast  cancer.  BRCA-related cancer screening. This may be done if you have a family history of breast, ovarian, tubal, or peritoneal cancers.  Pelvic exam and Pap test. This may be done every 3 years starting at age 73. Starting at age 37, this may be done every 5 years if you have a Pap test in combination with an HPV test. Other tests  Sexually transmitted disease (STD) testing.  Bone density scan. This is done to screen for osteoporosis. You may have this scan if you are at high risk for osteoporosis. Follow these instructions at home: Eating and drinking  Eat a diet that includes fresh fruits and vegetables, whole grains, lean protein, and low-fat dairy.  Take vitamin and mineral supplements as recommended by your health care provider.  Do not drink alcohol if: ? Your health care provider tells you not to drink. ? You are pregnant, may be pregnant, or are planning to become pregnant.  If you drink alcohol: ? Limit how much you have to 0-1 drink a day. ? Be aware of how much alcohol is in your drink. In the [place] one drink equals one 12 oz bottle of beer (355 mL), one 5 oz glass of wine (148 mL), or one 1 oz glass of hard liquor (44 mL). Lifestyle  Take daily care of your teeth and gums.  Stay active. Exercise for at least 30 minutes on 5 or more days each week.  Do not use any products that contain nicotine or tobacco, such as cigarettes, e-cigarettes, and chewing tobacco. If you need help quitting, ask your health care provider.  If you are sexually active, practice safe sex. Use a condom or other form of birth control (contraception) in order to prevent pregnancy and STIs (sexually transmitted infections).  If told by your health care provider, take low-dose aspirin daily starting at age 62. What's next?  Visit your health care provider once a year for a well check visit.  Ask your health care provider how often you should have your eyes and teeth checked.  Stay up to date  on all vaccines. This information is not intended to replace advice given to you by your health care provider. Make sure you discuss any questions you have with your health care provider. Document Revised: 02/15/2018 Document Reviewed: 02/15/2018 Elsevier Patient Education  2020 [employer].

## 2020-06-04 ENCOUNTER — Telehealth: Payer: Self-pay

## 2020-06-04 ENCOUNTER — Telehealth: Payer: Self-pay | Admitting: Obstetrics & Gynecology

## 2020-06-04 LAB — COMPREHENSIVE METABOLIC PANEL
ALT: 16 IU/L (ref 0–32)
AST: 18 IU/L (ref 0–40)
Albumin/Globulin Ratio: 1.9 (ref 1.2–2.2)
Albumin: 4 g/dL (ref 3.8–4.8)
Alkaline Phosphatase: 63 IU/L (ref 44–121)
BUN/Creatinine Ratio: 12 (ref 9–23)
BUN: 7 mg/dL (ref 6–24)
Bilirubin Total: 0.5 mg/dL (ref 0.0–1.2)
CO2: 20 mmol/L (ref 20–29)
Calcium: 8.7 mg/dL (ref 8.7–10.2)
Chloride: 105 mmol/L (ref 96–106)
Creatinine, Ser: 0.57 mg/dL (ref 0.57–1.00)
GFR calc Af Amer: 130 mL/min/{1.73_m2} (ref >59)
GFR calc non Af Amer: 113 mL/min/{1.73_m2} (ref >59)
Globulin, Total: 2.1 g/dL (ref 1.5–4.5)
Glucose: 99 mg/dL (ref 65–99)
Potassium: 4.4 mmol/L (ref 3.5–5.2)
Sodium: 140 mmol/L (ref 134–144)
Total Protein: 6.1 g/dL (ref 6.0–8.5)

## 2020-06-04 LAB — CBC WITH DIFFERENTIAL/PLATELET
Basophils Absolute: 0 10*3/uL (ref 0.0–0.2)
Basos: 0 %
EOS (ABSOLUTE): 0.1 10*3/uL (ref 0.0–0.4)
Eos: 1 %
Hematocrit: 39.3 % (ref 34.0–46.6)
Hemoglobin: 13.4 g/dL (ref 11.1–15.9)
Immature Grans (Abs): 0 10*3/uL (ref 0.0–0.1)
Immature Granulocytes: 0 %
Lymphocytes Absolute: 2 10*3/uL (ref 0.7–3.1)
Lymphs: 26 %
MCH: 31.2 pg (ref 26.6–33.0)
MCHC: 34.1 g/dL (ref 31.5–35.7)
MCV: 91 fL (ref 79–97)
Monocytes Absolute: 0.5 10*3/uL (ref 0.1–0.9)
Monocytes: 7 %
Neutrophils Absolute: 5 10*3/uL (ref 1.4–7.0)
Neutrophils: 66 %
Platelets: 239 10*3/uL (ref 150–450)
RBC: 4.3 x10E6/uL (ref 3.77–5.28)
RDW: 12.9 % (ref 11.7–15.4)
WBC: 7.7 10*3/uL (ref 3.4–10.8)

## 2020-06-04 LAB — B12 AND FOLATE PANEL
Folate: 8.1 ng/mL (ref >3.0)
Vitamin B-12: 341 pg/mL (ref 232–1245)

## 2020-06-04 LAB — LIPID PANEL
Chol/HDL Ratio: 2.3 ratio (ref 0.0–4.4)
Cholesterol, Total: 191 mg/dL (ref 100–199)
HDL: 82 mg/dL (ref >39)
LDL Chol Calc (NIH): 99 mg/dL (ref 0–99)
Triglycerides: 51 mg/dL (ref 0–149)
VLDL Cholesterol Cal: 10 mg/dL (ref 5–40)

## 2020-06-04 LAB — HEMOGLOBIN A1C
Est. average glucose Bld gHb Est-mCnc: 97 mg/dL
Hgb A1c MFr Bld: 5 % (ref 4.8–5.6)

## 2020-06-04 LAB — FSH/LH
FSH: 3.4 m[IU]/mL
LH: 8.8 m[IU]/mL

## 2020-06-04 LAB — VITAMIN D 25 HYDROXY (VIT D DEFICIENCY, FRACTURES): Vit D, 25-Hydroxy: 20 ng/mL — ABNORMAL LOW (ref 30.0–100.0)

## 2020-06-04 LAB — T4 AND TSH
T4, Total: 7.4 ug/dL (ref 4.5–12.0)
TSH: 1.26 u[IU]/mL (ref 0.450–4.500)

## 2020-06-04 NOTE — Telephone Encounter (Signed)
Copied from [place] [phone]. Topic: General - Other >> Jun 04, 2020 10:56 AM Leward Quan A wrote: Reason for CRM: Harold Barban patient mom called in to schedule an appointment for the removal of her IUD asking for a call back to schedule this procedure with Dr B. Can be reached at   Ph# [phone]

## 2020-06-04 NOTE — Telephone Encounter (Signed)
[facility] referring for Menorrhagia with irregular cycle. Called and left voicemail for patient to call back to be scheduled.

## 2020-06-05 NOTE — Telephone Encounter (Signed)
Apt 06/22/2020 at 8:40am  Thanks,   -Mickel Baas

## 2020-06-08 NOTE — Telephone Encounter (Signed)
Called and left voicemail for patient to call back to be scheduled. 

## 2020-06-15 ENCOUNTER — Other Ambulatory Visit: Payer: Self-pay | Admitting: Physician Assistant

## 2020-06-15 DIAGNOSIS — F900 Attention-deficit hyperactivity disorder, predominantly inattentive type: Secondary | ICD-10-CM

## 2020-06-15 MED ORDER — AMPHETAMINE-DEXTROAMPHET ER 15 MG PO CP24
15.0000 mg | ORAL_CAPSULE | ORAL | 0 refills | Status: DC
Start: 2020-06-15 — End: 2020-09-14

## 2020-06-15 NOTE — Telephone Encounter (Signed)
Called and left voicemail for patient to call back to be scheduled. 

## 2020-06-15 NOTE — Telephone Encounter (Signed)
Medication Refill - Medication: Addrall 150mg   Has the patient contacted their pharmacy? No. (Agent: If no, request that the patient contact the pharmacy for the refill.) (Agent: If yes, when and what did the pharmacy advise?)  Preferred Pharmacy (with phone number or street name[facility]  Agent: Please be advised that RX refills may take up to 3 business days. We ask that you follow-up with your pharmacy.

## 2020-06-15 NOTE — Telephone Encounter (Signed)
Requested medication (s) are due for refill today: yes  Requested medication (s) are on the active medication list: yes  Last refill:  02/11/20 #30 0 refills  Future visit scheduled: yes in 1 week  Notes to clinic:  not delegated per protocol     Requested Prescriptions  Pending Prescriptions Disp Refills   amphetamine-dextroamphetamine (ADDERALL XR) 15 MG 24 hr capsule 30 capsule 0    Sig: Take 1 capsule by mouth every morning.      Not Delegated - Psychiatry:  Stimulants/ADHD Failed - 06/15/2020 12:07 PM      Failed - This refill cannot be delegated      Failed - Urine Drug Screen completed in last 360 days      Passed - Valid encounter within last 3 months    Recent Outpatient Visits           1 week ago Annual physical exam   [facility] [place], [place]   2 months ago Acute non-recurrent pansinusitis   [facility] [place], [place], [place]   8 months ago Pneumonia of right lower lobe due to infectious organism   [facility], [place], [place]   8 months ago Attention deficit hyperactivity disorder (ADHD), predominantly inattentive type   [facility] Joycelyn Man M, [place]   10 months ago Acute non-recurrent frontal sinusitis   [facility] [place], [place], [place]       Future Appointments             In 1 week Bacigalupo, Marzella Schlein, MD [facility], [facility]   In 11 months Rosezetta Schlatter, Alessandra Bevels, PA-C [facility], [facility]

## 2020-06-22 ENCOUNTER — Other Ambulatory Visit: Payer: Self-pay

## 2020-06-22 ENCOUNTER — Encounter: Payer: Self-pay | Admitting: Family Medicine

## 2020-06-22 ENCOUNTER — Ambulatory Visit (INDEPENDENT_AMBULATORY_CARE_PROVIDER_SITE_OTHER): Payer: 59 | Admitting: Family Medicine

## 2020-06-22 VITALS — BP 120/66 | HR 80 | Temp 98.0°F | Resp 16 | Wt 205.0 lb

## 2020-06-22 DIAGNOSIS — Z30432 Encounter for removal of intrauterine contraceptive device: Secondary | ICD-10-CM | POA: Diagnosis not present

## 2020-06-22 MED ORDER — TRETINOIN 0.1 % EX CREA: TOPICAL_CREAM | Freq: Every day | CUTANEOUS | 1 refills | Status: DC

## 2020-06-22 NOTE — Progress Notes (Signed)
Established patient visit   Patient: Wendy [name]   DOB: 1976/01/13   45 y.o. Female  MRN: [id number] Visit Date: 06/22/2020  Today's healthcare provider: Shirlee Latch, MD   Chief Complaint  Patient presents with  . Contraception   Subjective    HPI  Patient here today to remove IUD. Patient reports feeling well.  She has amenorrhea due to IUD. She has gained weight and prefers to try to see what her body does without the IUD.  She does not need alternative contraception as she is s/p BTL  Patient Active Problem List   Diagnosis Date Noted  . Recurrent major depressive disorder, in partial remission (HCC) 06/03/2020  . GAD (generalized anxiety disorder) 11/05/2015  . ADHD (attention deficit hyperactivity disorder) 11/05/2015  . Obese 11/05/2015  . Urinary incontinence in female 11/05/2015  . Constipation 11/05/2015  . Menstrual migraine 11/05/2015  . Contraception management 05/02/2012  . Depression 02/13/2012  . Abnormal Pap smear of cervix 10/19/2011   Social History   Tobacco Use  . Smoking status: Never Smoker  . Smokeless tobacco: Never Used  Vaping Use  . Vaping Use: Never used  Substance Use Topics  . Alcohol use: Yes    Comment: occasional/rare  . Drug use: No   Allergies  Allergen Reactions  . Peanut (Diagnostic) Shortness Of Breath  . Peanuts [Peanut Oil] Shortness Of Breath  . Banana Itching  . Other Other (See Comments)    Walnuts- makes tongue blister  . Codeine Nausea And Vomiting  . Eggs Or Egg-Derived Products Nausea And Vomiting  . Penicillins   . Vicodin [Hydrocodone-Acetaminophen] Nausea And Vomiting       Medications: Outpatient Medications Prior to Visit  Medication Sig  . ALPRAZolam (XANAX) 0.5 MG tablet TAKE 1/2 TO 1 TABLET TWICE DAILY AS NEEDED FOR ANXIETY  . amphetamine-dextroamphetamine (ADDERALL XR) 15 MG 24 hr capsule Take 1 capsule by mouth every morning.  . Cholecalciferol (VITAMIN D PO) Take by mouth.  .  fluticasone furoate-vilanterol (BREO ELLIPTA) 100-25 MCG/INH AEPB Inhale 1 puff into the lungs daily.  Marland Kitchen MAGNESIUM CARBONATE PO Take by mouth.  . Multiple Vitamin (MULTI VITAMIN DAILY PO) Take by mouth.  Marland Kitchen PROAIR HFA 108 (90 Base) MCG/ACT inhaler INHALE 2 PUFFS INTO THE LUNGS EVERY 4-6 HOURS AS NEEDED  . tretinoin (RETIN-A) 0.1 % cream   . vortioxetine HBr (TRINTELLIX) 20 MG TABS tablet Take 1 tablet (20 mg total) by mouth daily. (Patient not taking: Reported on 06/22/2020)  . [DISCONTINUED] azithromycin (ZITHROMAX) 250 MG tablet Take 2 tablets PO on day one, and one tablet PO daily thereafter until completed.  . [DISCONTINUED] benzonatate (TESSALON) 200 MG capsule Take 1 capsule (200 mg total) by mouth 3 (three) times daily as needed.  . [DISCONTINUED] predniSONE (STERAPRED UNI-PAK 21 TAB) 10 MG (21) TBPK tablet 6 day taper; take as directed on package instructions   No facility-administered medications prior to visit.    Review of Systems  Constitutional: Negative.   Respiratory: Negative.   Cardiovascular: Negative.   Gastrointestinal: Negative.     Last CBC Lab Results  Component Value Date   WBC 7.7 06/03/2020   HGB 13.4 06/03/2020   HCT 39.3 06/03/2020   MCV 91 06/03/2020   MCH 31.2 06/03/2020   RDW 12.9 06/03/2020   PLT 239 06/03/2020      Objective    BP 120/66 (BP Location: Left Arm, Patient Position: Sitting, Cuff Size: Large)   Pulse 80  Temp 98 F (36.7 C) (Oral)   Resp 16   Wt 205 lb (93 kg)   SpO2 99%   BMI 36.31 kg/m  BP Readings from Last 3 Encounters:  06/22/20 120/66  06/03/20 107/77  04/24/19 107/77   Wt Readings from Last 3 Encounters:  06/22/20 205 lb (93 kg)  06/03/20 209 lb 3.2 oz (94.9 kg)  04/24/19 190 lb (86.2 kg)      Physical Exam Genitourinary:    General: Normal vulva.       No results found for any visits on 06/22/20.  Assessment & Plan     IUD removal: Procedure note Patient was given informed consent, signed copy in  the chart. Appropriate time out was taken. IUD strings easily visualized from external os.  Strings grasped with ring forceps and with patient giving increased abd pressure, strings pulled and IUD easily removed intact.  Patient tolerated procedure well.  Discussed return precautions.    I, Lavon Paganini, MD, have reviewed all documentation for this visit. The documentation on 06/22/20 for the exam, diagnosis, procedures, and orders are all accurate and complete.   Vicci Reder, Dionne Bucy, MD, MPH [facility]

## 2020-07-21 ENCOUNTER — Encounter: Payer: Self-pay | Admitting: Family Medicine

## 2020-07-21 ENCOUNTER — Ambulatory Visit (INDEPENDENT_AMBULATORY_CARE_PROVIDER_SITE_OTHER): Payer: 59 | Admitting: Family Medicine

## 2020-07-21 DIAGNOSIS — Z8616 Personal history of COVID-19: Secondary | ICD-10-CM

## 2020-07-21 DIAGNOSIS — J0100 Acute maxillary sinusitis, unspecified: Secondary | ICD-10-CM

## 2020-07-21 MED ORDER — DOXYCYCLINE HYCLATE 100 MG PO TABS: 100.0000 mg | ORAL_TABLET | Freq: Two times a day (BID) | ORAL | 0 refills | Status: AC

## 2020-07-21 NOTE — Patient Instructions (Signed)

## 2020-07-21 NOTE — Progress Notes (Signed)
MyChart Video Visit    Virtual Visit via Video Note   This visit type was conducted due to national recommendations for restrictions regarding the COVID-19 Pandemic (e.g. social distancing) in an effort to limit this patient's exposure and mitigate transmission in our community. This patient is at least at moderate risk for complications without adequate follow up. This format is felt to be most appropriate for this patient at this time. Physical exam was limited by quality of the video and audio technology used for the visit.    Patient location: home Provider location: [place] involved in the visit: patient, provider  I discussed the limitations of evaluation and management by telemedicine and the availability of in person appointments. The patient expressed understanding and agreed to proceed.  Patient: Wendy [name]   DOB: June 14, 1976   45 y.o. Female  MRN: [id number] Visit Date: 07/21/2020  Today's healthcare provider: Lavon Paganini, MD   Chief Complaint  Patient presents with  . URI   Subjective    HPI  Upper respiratory symptoms She complains of congestion, cough described as productive, no  fever and productive cough with  green colored sputum.with no fever, chills, night sweats or weight loss. Onset of symptoms was a few weeks ago and staying constant.She is drinking plenty of fluids.  Past history is significant for asthma, frequent episodes of bronchitis and pneumonia. Patient is non-smoker  ---------------------------------------------------------------------------------------------------   Pt presents with lingering congestion in throat, sinuses, chest and a productive cough with green mucus. Pt had covid second week of January. A lot of sinus congestion and bloody green drainage. Feels like a sinus infection. No fever   Patient Active Problem List   Diagnosis Date Noted  . Recurrent major depressive disorder, in partial remission  ([place]) 06/03/2020  . GAD (generalized anxiety disorder) 11/05/2015  . ADHD (attention deficit hyperactivity disorder) 11/05/2015  . Obese 11/05/2015  . Urinary incontinence in female 11/05/2015  . Constipation 11/05/2015  . Menstrual migraine 11/05/2015  . Contraception management 05/02/2012  . Depression 02/13/2012  . Abnormal Pap smear of cervix 10/19/2011   Social History   Tobacco Use  . Smoking status: Never Smoker  . Smokeless tobacco: Never Used  Vaping Use  . Vaping Use: Never used  Substance Use Topics  . Alcohol use: Yes    Comment: occasional/rare  . Drug use: No   Allergies  Allergen Reactions  . Peanut (Diagnostic) Shortness Of Breath  . Peanuts [Peanut Oil] Shortness Of Breath  . Banana Itching  . Other Other (See Comments)    Walnuts- makes tongue blister  . Codeine Nausea And Vomiting  . Eggs Or Egg-Derived Products Nausea And Vomiting  . Penicillins   . Vicodin [Hydrocodone-Acetaminophen] Nausea And Vomiting      Medications: Outpatient Medications Prior to Visit  Medication Sig  . ALPRAZolam (XANAX) 0.5 MG tablet TAKE 1/2 TO 1 TABLET TWICE DAILY AS NEEDED FOR ANXIETY  . amphetamine-dextroamphetamine (ADDERALL XR) 15 MG 24 hr capsule Take 1 capsule by mouth every morning.  . Cholecalciferol (VITAMIN D PO) Take by mouth.  . fluticasone furoate-vilanterol (BREO ELLIPTA) 100-25 MCG/INH AEPB Inhale 1 puff into the lungs daily.  Marland Kitchen MAGNESIUM CARBONATE PO Take by mouth.  . Multiple Vitamin (MULTI VITAMIN DAILY PO) Take by mouth.  Marland Kitchen PROAIR HFA 108 (90 Base) MCG/ACT inhaler INHALE 2 PUFFS INTO THE LUNGS EVERY 4-6 HOURS AS NEEDED  . tretinoin (RETIN-A) 0.1 % cream Apply topically at bedtime.  . vortioxetine HBr (  TRINTELLIX) 20 MG TABS tablet Take 1 tablet (20 mg total) by mouth daily. (Patient not taking: No sig reported)   No facility-administered medications prior to visit.    Review of Systems  - per HPI  Last CBC Lab Results  Component Value Date    WBC 7.7 06/03/2020   HGB 13.4 06/03/2020   HCT 39.3 06/03/2020   MCV 91 06/03/2020   MCH 31.2 06/03/2020   RDW 12.9 06/03/2020   PLT 239 06/03/2020      Objective    There were no vitals taken for this visit. BP Readings from Last 3 Encounters:  06/22/20 120/66  06/03/20 107/77  04/24/19 107/77   Wt Readings from Last 3 Encounters:  06/22/20 205 lb (93 kg)  06/03/20 209 lb 3.2 oz (94.9 kg)  04/24/19 190 lb (86.2 kg)      Physical Exam Constitutional:      General: She is not in acute distress.    Appearance: Normal appearance.  HENT:     Head: Normocephalic.  Pulmonary:     Effort: Pulmonary effort is normal. No respiratory distress.  Neurological:     Mental Status: She is alert and oriented to person, place, and time.  Psychiatric:        Behavior: Behavior normal.        Assessment & Plan     1. History of COVID-19 2. Acute non-recurrent maxillary sinusitis - recovering from COVID, now with ongoing sinus congestio nadn pressure - symptoms and c/w sinusitis   - no evidence of AOM, CAP, strep pharyngitis, or other infection - given duration of symptoms, suspect bacterial etiology - will treat with doxycycline x7d - discussed symptomatic management (flonase, decongestants, etc), natural course, and return precautions     Meds ordered this encounter  Medications  . doxycycline (VIBRA-TABS) 100 MG tablet    Sig: Take 1 tablet (100 mg total) by mouth 2 (two) times daily for 7 days.    Dispense:  14 tablet    Refill:  0     Return if symptoms worsen or fail to improve.     I discussed the assessment and treatment plan with the patient. The patient was provided an opportunity to ask questions and all were answered. The patient agreed with the plan and demonstrated an understanding of the instructions.   The patient was advised to call back or seek an in-person evaluation if the symptoms worsen or if the condition fails to improve as anticipated.   I,  Lavon Paganini, MD, have reviewed all documentation for this visit. The documentation on 07/21/20 for the exam, diagnosis, procedures, and orders are all accurate and complete.   [place], Dionne Bucy, MD, MPH [facility]

## 2020-08-11 ENCOUNTER — Other Ambulatory Visit: Payer: Self-pay | Admitting: Physician Assistant

## 2020-08-11 DIAGNOSIS — Z8249 Family history of ischemic heart disease and other diseases of the circulatory system: Secondary | ICD-10-CM

## 2020-08-18 ENCOUNTER — Ambulatory Visit (INDEPENDENT_AMBULATORY_CARE_PROVIDER_SITE_OTHER): Payer: 59 | Admitting: Adult Health

## 2020-08-18 ENCOUNTER — Encounter: Payer: Self-pay | Admitting: Adult Health

## 2020-08-18 ENCOUNTER — Other Ambulatory Visit: Payer: Self-pay

## 2020-08-18 VITALS — BP 112/74 | HR 88 | Temp 98.0°F | Resp 16 | Wt 211.6 lb

## 2020-08-18 DIAGNOSIS — Z202 Contact with and (suspected) exposure to infections with a predominantly sexual mode of transmission: Secondary | ICD-10-CM

## 2020-08-18 DIAGNOSIS — A749 Chlamydial infection, unspecified: Secondary | ICD-10-CM | POA: Insufficient documentation

## 2020-08-18 MED ORDER — DOXYCYCLINE HYCLATE 100 MG PO TABS: 100.0000 mg | ORAL_TABLET | Freq: Two times a day (BID) | ORAL | 0 refills | Status: DC

## 2020-08-18 NOTE — Patient Instructions (Signed)
Preventing Sexually Transmitted Infections, Adult Sexually transmitted infections (STIs) are diseases that are spread from person to person (are contagious). They are spread, or transmitted, through bodily fluids exchanged during sex or sexual contact. These bodily fluids include saliva, semen, blood, vaginal mucus, and urine. STIs are very common among people of all ages. Some common STIs include:  Herpes.  Hepatitis B.  Chlamydia.  Gonorrhea.  Syphilis.  HPV (human papillomavirus).  HIV, also called the human immunodeficiency virus. This is the virus that can cause AIDS (acquired immunodeficiency syndrome). Often, people who have these STIs do not have symptoms. Even without symptoms, these infections can be spread from person to person and require treatment. How can these conditions affect me? STIs can be treated, and many STIs can be cured. However, some STIs cannot be cured and will affect you for the rest of your life. Certain STIs may:  Require you to take medicine for the rest of your life.  Affect your ability to have children (your fertility).  Increase your risk for developing another STI or certain serious health conditions. These may include: ? Cervical cancer. ? Head and neck cancer. ? Pelvic inflammatory disease (PID), in women. ? Organ damage or damage to other parts of your body, if the infection spreads.  Cause problems during pregnancy and may be transmitted to the baby during the pregnancy or childbirth. What can increase my risk? You may have an increased risk for developing an STI if:  You have unprotected sex. Sex includes oral, vaginal, or anal sex.  You have more than one sex partner.  You have a sex partner who has multiple sex partners.  You have sex with someone who has an STI.  You have an STI or you had an STI before.  You inject drugs or have a sex partner or partners who inject drugs. What actions can I take to prevent STIs? The only way  to completely prevent STIs is not to have sex of any kind. This is called practicing abstinence. If you are sexually active, you can protect yourself and others by taking these actions to lower your risk of getting an STI: Lifestyle Avoid mixing alcohol, drugs, and sex. Alcohol and drug use can affect your ability to make good decisions and can lead to risky sexual behaviors. Medicines Ask your health care provider about taking pre-exposure prophylaxis (PrEP) to prevent HIV infection. General information  Stay up to date on vaccinations. Certain vaccines can lower your risk of getting certain STIs, such as: ? Hepatitis A and hepatitis B vaccines. You may have been vaccinated as a young child, but you will likely need a booster shot as a teen or young adult. ? HPV (human papillomavirus) vaccine.  Have only one sex partner (be monogamous) or limit the number of sex partners you have.  Use methods that prevent the exchange of body fluids between partners (barrier protection) correctly every time you have sex. Barrier protection can be used during oral, vaginal, or anal sex. Commonly used barrier methods include: ? Female condom. ? Female condom. ? Dental dam.  Use a new condom for every sex act from start to finish.  Get tested for STIs. Have your partners get tested, too.  If you test positive for an STI, follow recommendations from your health care provider about treatment and make sure your sex partners are tested and treated.  Birth control pills, injections, implants, and intrauterine devices (IUDs) do not protect against STIs. To prevent both STIs and  pregnancy, always use a condom with another form of birth control.  Some STIs, such as herpes, are spread through skin-to-skin contact. A condom may not protect you from getting such STIs. Avoid all sexual contact if you or your partners have herpes and there is an active flare with open sores.   Where to find more information Learn more  about STIs from:  Centers for Disease Control and Prevention: ? More information about specific STIs: https://price.info/ ? Places to get sexual health counseling and treatment for free or at a low cost: gettested.StoreMirror.com.cy  U.S. Department of Health and Human Services: [place].tn Summary  Sexually transmitted infections (STIs) can spread through exchanging bodily fluids during sexual contact. Fluids include saliva, semen, blood, vaginal mucus, and urine.  You may have an increased risk for developing an STI if you have unprotected sex. Sex includes oral, vaginal, or anal sex.  If you do have sex, limit your number of sex partners and use barrier protection every time you have sex. This information is not intended to replace advice given to you by your health care provider. Make sure you discuss any questions you have with your health care provider. Document Revised: 07/22/2019 Document Reviewed: 07/22/2019 Elsevier Patient Education  2021 [place]. Doxycycline tablets or capsules What is this medicine? DOXYCYCLINE (dox i SYE kleen) is a tetracycline antibiotic. It kills certain bacteria or stops their growth. It is used to treat many kinds of infections, like dental, skin, respiratory, and urinary tract infections. It also treats acne, Lyme disease, malaria, and certain sexually transmitted infections. This medicine may be used for other purposes; ask your health care provider or pharmacist if you have questions. COMMON BRAND NAME(S): Acticlate, Adoxa, Adoxa CK, Adoxa Pak, Adoxa TT, Alodox, Avidoxy, Doxal, LYMEPAK, Mondoxyne NL, Monodox, Morgidox 1x, Morgidox 1x Kit, Morgidox 2x, Morgidox 2x Kit, NutriDox, Ocudox, [place], [place], [place], Vibra-Tabs, Vibramycin What should I tell my health care provider before I take this medicine? They need to know if you have any of these conditions:  liver disease  long exposure to sunlight like working outdoors  stomach problems like  colitis  an unusual or allergic reaction to doxycycline, tetracycline antibiotics, other medicines, foods, dyes, or preservatives  pregnant or trying to get pregnant  breast-feeding How should I use this medicine? Take this medicine by mouth with a full glass of water. Follow the directions on the prescription label. It is best to take this medicine without food, but if it upsets your stomach take it with food. Take your medicine at regular intervals. Do not take your medicine more often than directed. Take all of your medicine as directed even if you think you are better. Do not skip doses or stop your medicine early. Talk to your pediatrician regarding the use of this medicine in children. While this drug may be prescribed for selected conditions, precautions do apply. Overdosage: If you think you have taken too much of this medicine contact a poison control center or emergency room at once. NOTE: This medicine is only for you. Do not share this medicine with others. What if I miss a dose? If you miss a dose, take it as soon as you can. If it is almost time for your next dose, take only that dose. Do not take double or extra doses. What may interact with this medicine?  antacids  barbiturates  birth control pills  bismuth subsalicylate  carbamazepine  methoxyflurane  other antibiotics  phenytoin  vitamins that contain iron  warfarin This list may not describe all possible interactions. Give your health care provider a list of all the medicines, herbs, non-prescription drugs, or dietary supplements you use. Also tell them if you smoke, drink alcohol, or use illegal drugs. Some items may interact with your medicine. What should I watch for while using this medicine? Tell your doctor or health care professional if your symptoms do not improve. Do not treat diarrhea with over the counter products. Contact your doctor if you have diarrhea that lasts more than 2 days or if it is  severe and watery. Do not take this medicine just before going to bed. It may not dissolve properly when you lay down and can cause pain in your throat. Drink plenty of fluids while taking this medicine to also help reduce irritation in your throat. This medicine can make you more sensitive to the sun. Keep out of the sun. If you cannot avoid being in the sun, wear protective clothing and use sunscreen. Do not use sun lamps or tanning beds/booths. Birth control pills may not work properly while you are taking this medicine. Talk to your doctor about using an extra method of birth control. If you are being treated for a sexually transmitted infection, avoid sexual contact until you have finished your treatment. Your sexual partner may also need treatment. Avoid antacids, aluminum, calcium, magnesium, and iron products for 4 hours before and 2 hours after taking a dose of this medicine. If you are using this medicine to prevent malaria, you should still protect yourself from contact with mosquitos. Stay in screened-in areas, use mosquito nets, keep your body covered, and use an insect repellent. What side effects may I notice from receiving this medicine? Side effects that you should report to your doctor or health care professional as soon as possible:  allergic reactions like skin rash, itching or hives, swelling of the face, lips, or tongue  difficulty breathing  fever  itching in the rectal or genital area  pain on swallowing  rash, fever, and swollen lymph nodes  redness, blistering, peeling or loosening of the skin, including inside the mouth  severe stomach pain or cramps  unusual bleeding or bruising  unusually weak or tired  yellowing of the eyes or skin Side effects that usually do not require medical attention (report to your doctor or health care professional if they continue or are bothersome):  diarrhea  loss of appetite  nausea, vomiting This list may not describe  all possible side effects. Call your doctor for medical advice about side effects. You may report side effects to FDA at 1-800-FDA-1088. Where should I keep my medicine? Keep out of the reach of children. Store at room temperature, below 30 degrees C (86 degrees F). Protect from light. Keep container tightly closed. Throw away any unused medicine after the expiration date. Taking this medicine after the expiration date can make you seriously ill. NOTE: This sheet is a summary. It may not cover all possible information. If you have questions about this medicine, talk to your doctor, pharmacist, or health care provider.  2021 Elsevier/Gold Standard (2018-09-06 13:44:53)

## 2020-08-18 NOTE — Progress Notes (Signed)
Established patient visit   Patient: Wendy [name]   DOB: 08/31/1975   45 y.o. Female  MRN: [id number] Visit Date: 08/18/2020  Today's healthcare provider: Marcille Buffy, FNP   Chief Complaint  Patient presents with  . Exposure to STD   Subjective    Exposure to STD  The patient's pertinent negatives include no discharge, dyspareunia, dysuria, genital itching, genital lesions, genital rash or pelvic pain. This is a new (Partner advised patient that he tested positive for chlamydia 08/17/20) problem. The current episode started today. Pertinent negatives include no abdominal pain, anorexia, diaphoresis, fever, genital odor, rectal pain, sore throat or urinary frequency. She has tried nothing for the symptoms.    She has no STD history. She recently became sexually active again.   Patient  denies any fever, body aches,chills, rash, chest pain, shortness of breath, nausea, vomiting, or diarrhea.  Denies dizziness, lightheadedness, pre syncopal or syncopal episodes.   No LMP recorded. (Menstrual status: IUD). Menses ending 2 days ago- regular cycle.     Patient Active Problem List   Diagnosis Date Noted  . Chlamydia 08/18/2020  . Exposure to STD 08/18/2020  . Recurrent major depressive disorder, in partial remission ([place]) 06/03/2020  . GAD (generalized anxiety disorder) 11/05/2015  . ADHD (attention deficit hyperactivity disorder) 11/05/2015  . Obese 11/05/2015  . Urinary incontinence in female 11/05/2015  . Constipation 11/05/2015  . Menstrual migraine 11/05/2015  . Contraception management 05/02/2012  . Depression 02/13/2012  . Abnormal Pap smear of cervix 10/19/2011   Past Medical History:  Diagnosis Date  . Abnormal Pap smear of cervix 10/2011  . Allergy   . Anxiety   . Depression   . Heart murmur    slight   Allergies  Allergen Reactions  . Peanut (Diagnostic) Shortness Of Breath  . Peanuts [Peanut Oil] Shortness Of Breath  . Banana Itching  .  Other Other (See Comments)    Walnuts- makes tongue blister  . Codeine Nausea And Vomiting  . Eggs Or Egg-Derived Products Nausea And Vomiting  . Penicillins   . Vicodin [Hydrocodone-Acetaminophen] Nausea And Vomiting       Medications: Outpatient Medications Prior to Visit  Medication Sig  . ALPRAZolam (XANAX) 0.5 MG tablet TAKE 1/2 TO 1 TABLET TWICE DAILY AS NEEDED FOR ANXIETY  . amphetamine-dextroamphetamine (ADDERALL XR) 15 MG 24 hr capsule Take 1 capsule by mouth every morning.  . Cholecalciferol (VITAMIN D PO) Take by mouth.  . fluticasone furoate-vilanterol (BREO ELLIPTA) 100-25 MCG/INH AEPB Inhale 1 puff into the lungs daily.  Marland Kitchen MAGNESIUM CARBONATE PO Take by mouth.  . Multiple Vitamin (MULTI VITAMIN DAILY PO) Take by mouth.  Marland Kitchen PROAIR HFA 108 (90 Base) MCG/ACT inhaler INHALE 2 PUFFS INTO THE LUNGS EVERY 4-6 HOURS AS NEEDED  . tretinoin (RETIN-A) 0.1 % cream Apply topically at bedtime.  . vortioxetine HBr (TRINTELLIX) 20 MG TABS tablet Take 1 tablet (20 mg total) by mouth daily.   No facility-administered medications prior to visit.    Review of Systems  Constitutional: Negative.  Negative for diaphoresis and fever.  HENT: Negative for sore throat.   Respiratory: Negative.   Gastrointestinal: Negative for abdominal pain, anorexia and rectal pain.  Genitourinary: Positive for menstrual problem (history of menorrhagia no change has been reffered to gynecoloygy in past. not gone for appointment yet. ). Negative for dyspareunia, dysuria, frequency and pelvic pain.    Last CBC Lab Results  Component Value Date   WBC 7.7 06/03/2020  HGB 13.4 06/03/2020   HCT 39.3 06/03/2020   MCV 91 06/03/2020   MCH 31.2 06/03/2020   RDW 12.9 06/03/2020   PLT 239 69/67/8938   Last metabolic panel Lab Results  Component Value Date   GLUCOSE 99 06/03/2020   NA 140 06/03/2020   K 4.4 06/03/2020   CL 105 06/03/2020   CO2 20 06/03/2020   BUN 7 06/03/2020   CREATININE 0.57 06/03/2020    GFRNONAA 113 06/03/2020   GFRAA 130 06/03/2020   CALCIUM 8.7 06/03/2020   PROT 6.1 06/03/2020   ALBUMIN 4.0 06/03/2020   LABGLOB 2.1 06/03/2020   AGRATIO 1.9 06/03/2020   BILITOT 0.5 06/03/2020   ALKPHOS 63 06/03/2020   AST 18 06/03/2020   ALT 16 06/03/2020       Objective    BP 112/74   Pulse 88   Temp 98 F (36.7 C) (Oral)   Resp 16   Wt 211 lb 9.6 oz (96 kg)   SpO2 100%   BMI 37.48 kg/m  BP Readings from Last 3 Encounters:  08/18/20 112/74  06/22/20 120/66  06/03/20 107/77   Wt Readings from Last 3 Encounters:  08/18/20 211 lb 9.6 oz (96 kg)  06/22/20 205 lb (93 kg)  06/03/20 209 lb 3.2 oz (94.9 kg)       Physical Exam Vitals reviewed.  HENT:     Head: Normocephalic and atraumatic.     Right Ear: External ear normal. There is no impacted cerumen.     Left Ear: External ear normal. There is no impacted cerumen.     Nose: Nose normal.     Mouth/Throat:     Mouth: Mucous membranes are moist.     Pharynx: Oropharynx is clear. No oropharyngeal exudate or posterior oropharyngeal erythema.  Eyes:     Conjunctiva/sclera: Conjunctivae normal.     Pupils: Pupils are equal, round, and reactive to light.  Cardiovascular:     Rate and Rhythm: Normal rate and regular rhythm.     Pulses: Normal pulses.     Heart sounds: Normal heart sounds.  Pulmonary:     Effort: Pulmonary effort is normal.     Breath sounds: Normal breath sounds.  Abdominal:     General: There is no distension.     Palpations: Abdomen is soft.     Tenderness: There is no abdominal tenderness.  Genitourinary:    Exam position: Lithotomy position.     Pubic Area: No rash or pubic lice.      Tanner stage (genital): 5.     Labia:        Right: No rash, tenderness, lesion or injury.        Left: No rash, tenderness, lesion or injury.      Vagina: No signs of injury and foreign body. Vaginal discharge (light brown scant ) present. No erythema, tenderness, bleeding, lesions or prolapsed vaginal  walls.     Cervix: Discharge (same as vaginal. ) present. No cervical motion tenderness, friability or erythema.     Uterus: Normal.      Adnexa: Right adnexa normal and left adnexa normal.     Rectum: Normal.  Musculoskeletal:        General: Normal range of motion.     Cervical back: Normal range of motion.  Skin:    General: Skin is warm.     Findings: No erythema or rash.  Neurological:     Mental Status: She is oriented to person, place, and time.  Psychiatric:        Mood and Affect: Mood normal.        Behavior: Behavior normal.        Thought Content: Thought content normal.        Judgment: Judgment normal.      No results found for any visits on 08/18/20.  Assessment & Plan     Exposure to STD - Plan: RPR, HIV antibody (with reflex), HSV(herpes simplex vrs) 1+2 ab-IgG, HSV(herpes simplex vrs) 1+2 ab-IgM, NuSwab Vaginitis Plus (VG+), doxycycline (VIBRA-TABS) 100 MG tablet  Exposure to chlamydia - Plan: doxycycline (VIBRA-TABS) 100 MG tablet  Meds ordered this encounter  Medications  . doxycycline (VIBRA-TABS) 100 MG tablet    Sig: Take 1 tablet (100 mg total) by mouth 2 (two) times daily.    Dispense:  20 tablet    Refill:  0  abstinence during treatment. If positive reportable to health department. Will go ahead and treat since she was contacted with positive exposure.   Red Flags discussed. The patient was given clear instructions to go to ER or return to medical center if any red flags develop, symptoms do not improve, worsen or new problems develop. They verbalized understanding.   Return in 3 weeks (on 09/08/2020), or if symptoms worsen or fail to improve, for Go to Emergency room/ urgent care if worse, at any time for any worsening symptoms.      The entirety of the information documented in the History of Present Illness, Review of Systems and Physical Exam were personally obtained by me. Portions of this information were initially documented by the CMA and  reviewed by me for thoroughness and accuracy.     Marcille Buffy, [place] [phone] (phone) [phone] (fax)  [place]

## 2020-08-19 NOTE — Progress Notes (Signed)
Negative RPR syphilis screening, negative HIV screening. Herpes simplex id negative for past infection. HSV active herpes lab is still pending will result to mycart. Nuswab vaginal swab is still pending as well and will result to mychart once resulted.

## 2020-08-20 LAB — HSV(HERPES SIMPLEX VRS) I + II AB-IGM: HSVI/II Comb IgM: <0.91 Ratio (ref 0.00–0.90)

## 2020-08-20 LAB — HIV ANTIBODY (ROUTINE TESTING W REFLEX): HIV Screen 4th Generation wRfx: NONREACTIVE

## 2020-08-20 LAB — HSV(HERPES SIMPLEX VRS) I + II AB-IGG
HSV 1 Glycoprotein G Ab, IgG: <0.91 index (ref 0.00–0.90)
HSV 2 IgG, Type Spec: <0.91 index (ref 0.00–0.90)

## 2020-08-20 LAB — RPR: RPR Ser Ql: NONREACTIVE

## 2020-08-20 NOTE — Progress Notes (Signed)
RPR for syphilis is negative. HIV non reactive.   HSV 1 and 2 negative for past herpes simplex virus.  HSV 1 and 2 IGM for current HSV infection is negative.  Negative NUSWAB for trichomonas, chlamydia and gonorrhea. Would still advise completing treatment since known exposure to Chlamydia and return if any symptoms.retest 30 days would be advised.  Swab is still pending results for Bacterial vaginosis and yeast will result once it results.

## 2020-08-22 LAB — NUSWAB VAGINITIS PLUS (VG+)
Atopobium vaginae: HIGH Score — AB
BVAB 2: HIGH Score — AB
Candida albicans, NAA: NEGATIVE
Candida glabrata, NAA: NEGATIVE
Chlamydia trachomatis, NAA: NEGATIVE
Neisseria gonorrhoeae, NAA: NEGATIVE
Trich vag by NAA: NEGATIVE

## 2020-08-24 ENCOUNTER — Other Ambulatory Visit: Payer: Self-pay | Admitting: Adult Health

## 2020-08-24 DIAGNOSIS — B9689 Other specified bacterial agents as the cause of diseases classified elsewhere: Secondary | ICD-10-CM | POA: Insufficient documentation

## 2020-08-24 DIAGNOSIS — N760 Acute vaginitis: Secondary | ICD-10-CM | POA: Insufficient documentation

## 2020-08-24 MED ORDER — METRONIDAZOLE 500 MG PO TABS: 500.0000 mg | ORAL_TABLET | Freq: Two times a day (BID) | ORAL | 0 refills | Status: DC

## 2020-08-24 NOTE — Progress Notes (Signed)
Bacterial vaginosis  Meds ordered this encounter  Medications  . metroNIDAZOLE (FLAGYL) 500 MG tablet    Sig: Take 1 tablet (500 mg total) by mouth 2 (two) times daily.    Dispense:  14 tablet    Refill:  0

## 2020-08-24 NOTE — Progress Notes (Signed)
Patient's NuSwab returned showing bacterial vaginosis, STD testing was negative in [place]. Since she was exposed to chlamydia I do recommend her completing the Doxycycline as prescribed. Then she should takeMeds ordered this encounter Medications  metroNIDAZOLE (FLAGYL) 500 MG tablet   Sig: Take 1 tablet (500 mg total) by mouth 2 (two) times daily.   Dispense:  14 tablet   Refill:  0 The above medication for bacterial vaginitis and she should not drink any alcohol while taking Flagyl and none for 1 week afterwards. [facility] Ok to send to another pharmacy as above if needed.  Return if any symptoms persist, occur or worsen at anytime..  Also see labwork  message be sure patient receiveRPR for syphilis is negative. HIV non reactive.   HSV 1 and 2 negative for past herpes simplex virus.  HSV 1 and 2 IGM for current HSV infection is negative.  Negative NUSWAB for trichomonas, chlamydia and gonorrhea. Would still advise completing treatment since known exposure to Chlamydia and return if any symptoms.retest 30 days would be advised. d:

## 2020-08-25 ENCOUNTER — Telehealth: Payer: Self-pay

## 2020-08-25 NOTE — Telephone Encounter (Signed)
Copied from [place] [id number]. Topic: General - Other >> Aug 25, 2020  4:09 PM Pawlus, Brayton Layman A wrote: Reason for CRM: Pt called in stating she has spoken to her pharmacy a few times today and they do not have 'metroNIDAZOLE (FLAGYL) 500 MG tablet ', pt requested we re-send in her prescription or advise her of how she can get her meds.

## 2020-08-26 NOTE — Telephone Encounter (Signed)
[facility] and [facility] this morning. KW

## 2020-09-07 ENCOUNTER — Other Ambulatory Visit: Payer: Self-pay | Admitting: Physician Assistant

## 2020-09-07 DIAGNOSIS — J4530 Mild persistent asthma, uncomplicated: Secondary | ICD-10-CM

## 2020-09-07 NOTE — Telephone Encounter (Signed)
   Notes to clinic:  this script has expired  Review for new script and continued use    Requested Prescriptions  Pending Prescriptions Disp Refills   BREO ELLIPTA 100-25 MCG/INH AEPB [Pharmacy Med Name: BREO ELLIPTA 100-25 MCG/INH INH AEP] 60 each 0    Sig: Inhale 1 puff into the lungs daily.      Pulmonology:  Combination Products Passed - 09/07/2020 11:01 AM      Passed - Valid encounter within last 12 months    Recent Outpatient Visits           2 weeks ago Exposure to STD   [facility] [place], Kelby Aline, [place]   1 month ago History of [place] Bacigalupo, Dionne Bucy, MD   2 months ago Encounter for IUD removal   [facility], Dionne Bucy, MD   3 months ago Annual physical exam   [place], [place]   5 months ago Acute non-recurrent pansinusitis   [place], [place]       Future Appointments             In 9 months Burnette, Clearnce Sorrel, PA-C [employer], [place]

## 2020-09-14 ENCOUNTER — Other Ambulatory Visit: Payer: Self-pay | Admitting: Family Medicine

## 2020-09-14 DIAGNOSIS — F900 Attention-deficit hyperactivity disorder, predominantly inattentive type: Secondary | ICD-10-CM

## 2020-09-14 NOTE — Telephone Encounter (Signed)
Requested medication (s) are due for refill today: yes  Requested medication (s) are on the active medication list: yes  Last refill:  06/15/20  Future visit scheduled: yes  Notes to clinic:  not delegated   Requested Prescriptions  Pending Prescriptions Disp Refills   amphetamine-dextroamphetamine (ADDERALL XR) 15 MG 24 hr capsule 30 capsule 0    Sig: Take 1 capsule by mouth every morning.      Not Delegated - Psychiatry:  Stimulants/ADHD Failed - 09/14/2020 11:51 AM      Failed - This refill cannot be delegated      Failed - Urine Drug Screen completed in last 360 days      Passed - Valid encounter within last 3 months    Recent Outpatient Visits           3 weeks ago Exposure to STD   [facility] Flinchum, Kelby Aline, FNP   1 month ago History of [place] Bacigalupo, Dionne Bucy, MD   2 months ago Encounter for IUD removal   [facility], Dionne Bucy, MD   3 months ago Annual physical exam   [place], [place]   5 months ago Acute non-recurrent pansinusitis   [place], [place]       Future Appointments             In 8 months Burnette, Clearnce Sorrel, PA-C [employer], [place]

## 2020-09-14 NOTE — Telephone Encounter (Signed)
Medication Refill - Medication:   amphetamine-dextroamphetamine (ADDERALL XR) 15 MG 24 hr capsule   Has the patient contacted their pharmacy? Yes.  controlled substance, doctor approval needed.   Preferred Pharmacy (with phone number or street name):   [id number] [place], [place]  [address] [place] [address]  Phone: [phone] Fax: [phone]     Agent: Please be advised that RX refills may take up to 3 business days. We ask that you follow-up with your pharmacy.

## 2020-09-15 MED ORDER — AMPHETAMINE-DEXTROAMPHET ER 15 MG PO CP24
15.0000 mg | ORAL_CAPSULE | ORAL | 0 refills | Status: DC
Start: 2020-09-15 — End: 2020-10-16

## 2020-09-15 NOTE — Telephone Encounter (Signed)
See Rx request ° °

## 2020-10-01 ENCOUNTER — Other Ambulatory Visit: Payer: Self-pay

## 2020-10-01 ENCOUNTER — Ambulatory Visit (INDEPENDENT_AMBULATORY_CARE_PROVIDER_SITE_OTHER): Payer: 59 | Admitting: Adult Health

## 2020-10-01 ENCOUNTER — Encounter: Payer: Self-pay | Admitting: Adult Health

## 2020-10-01 VITALS — BP 110/72 | HR 82 | Resp 16 | Wt 205.2 lb

## 2020-10-01 DIAGNOSIS — Z202 Contact with and (suspected) exposure to infections with a predominantly sexual mode of transmission: Secondary | ICD-10-CM

## 2020-10-01 DIAGNOSIS — N760 Acute vaginitis: Secondary | ICD-10-CM

## 2020-10-01 MED ORDER — METRONIDAZOLE 500 MG PO TABS: 500.0000 mg | ORAL_TABLET | Freq: Two times a day (BID) | ORAL | 0 refills | Status: DC

## 2020-10-01 MED ORDER — TERCONAZOLE 0.4 % VA CREA: 1.0000 | TOPICAL_CREAM | Freq: Every day | VAGINAL | 0 refills | Status: AC

## 2020-10-01 NOTE — Patient Instructions (Signed)
Terconazole vaginal cream What is this medicine? TERCONAZOLE (ter KON a zole) is an antifungal medicine. It is used to treat yeast infections of the vagina. This medicine may be used for other purposes; ask your health care provider or pharmacist if you have questions. COMMON BRAND NAME(S): Terazol 3, Terazol 7, Zazole What should I tell my health care provider before I take this medicine? They need to know if you have any of these conditions:  an unusual or allergic reaction to terconazole, other antifungals, other medicines, foods, dyes or preservatives  pregnant or trying to get pregnant  breast-feeding How should I use this medicine? This medicine is only for use in the vagina. Do not take by mouth. Wash hands before and after use. Read package directions carefully before using. Use this medicine at bedtime, unless otherwise directed by your doctor or health care professional. Screw the applicator onto the end of the tube and squeeze the tube to fill the applicator. Remove the applicator from the tube. Lie on your back. Gently insert the applicator tip high in the vagina and push the plunger to release the cream into the vagina. Gently remove the applicator. Wash the applicator well with warm water and soap. Use at regular intervals. Do not get this medicine in your eyes. If you do, rinse out with plenty of cool tap water. Finish the full course prescribed by your doctor or health care professional even if you think your condition is better. Do not stop using this medicine if your menstrual period starts during the time of treatment. Talk to your pediatrician regarding the use of this medicine in children. Special care may be needed. Overdosage: If you think you have taken too much of this medicine contact a poison control center or emergency room at once. NOTE: This medicine is only for you. Do not share this medicine with others. What if I miss a dose? If you miss a dose, use it as soon as  you can. If it is almost time for your next dose, use only that dose. Do not use double or extra doses. What may interact with this medicine? Interactions are not expected. Do not use any other vaginal products without telling your doctor or health care professional. This list may not describe all possible interactions. Give your health care provider a list of all the medicines, herbs, non-prescription drugs, or dietary supplements you use. Also tell them if you smoke, drink alcohol, or use illegal drugs. Some items may interact with your medicine. What should I watch for while using this medicine? Tell your doctor or health care professional if your symptoms do not start to get better within a few days. It is better not to have sex until you have finished your treatment. If you have sex, your partner should use a condom during sex to help prevent transfer of the infection. Your sexual partner may also need treatment. Vaginal medicines usually will come out of the vagina during treatment. To keep the medicine from getting on your clothing, wear a mini-pad or sanitary napkin. The use of tampons is not recommended since they may soak up the medicine. To help clear up the infection, wear freshly washed cotton, not synthetic, underwear. What side effects may I notice from receiving this medicine? Side effects that you should report to your doctor or health care professional as soon as possible:  painful or difficult urination  vaginal pain Side effects that usually do not require medical attention (report to your doctor  or health care professional if they continue or are bothersome):  headache  menstrual pain  stomach upset  vaginal irritation, itching or burning This list may not describe all possible side effects. Call your doctor for medical advice about side effects. You may report side effects to FDA at 1-800-FDA-1088. Where should I keep my medicine? Keep out of the reach of  children. Store at room temperature between 15 and 30 degrees C (59 and 86 degrees F). Throw away any unused medicine after the expiration date. NOTE: This sheet is a summary. It may not cover all possible information. If you have questions about this medicine, talk to your doctor, pharmacist, or health care provider.  2021 Elsevier/Gold Standard (2008-02-20 13:51:27) Metronidazole tablets or capsules What is this medicine? METRONIDAZOLE (me troe NI da zole) is an antiinfective. It is used to treat certain kinds of bacterial and protozoal infections. It will not work for colds, flu, or other viral infections. This medicine may be used for other purposes; ask your health care provider or pharmacist if you have questions. COMMON BRAND NAME(S): Flagyl What should I tell my health care provider before I take this medicine? They need to know if you have any of these conditions:  Cockayne syndrome  history of blood diseases such as sickle cell anemia, anemia, or leukemia  if you often drink alcohol  irregular heartbeat or rhythm  kidney disease  liver disease  yeast or fungal infection  an unusual or allergic reaction to metronidazole, nitroimidazoles, or other medicines, foods, dyes, or preservatives  pregnant or trying to get pregnant  breast-feeding How should I use this medicine? Take this medicine by mouth with water. Take it as directed on the prescription label at the same time every day. Take all of this medicine unless your health care provider tells you to stop it early. Keep taking it even if you think you are better. Talk to your health care provider about the use of this medicine in children. While it may be prescribed for children for selected conditions, precautions do apply. Overdosage: If you think you have taken too much of this medicine contact a poison control center or emergency room at once. NOTE: This medicine is only for you. Do not share this medicine with  others. What if I miss a dose? If you miss a dose, take it as soon as you can. If it is almost time for your next dose, take only that dose. Do not take double or extra doses. What may interact with this medicine? Do not take this medicine with any of the following medications:  alcohol or any product that contains alcohol  cisapride  disulfiram  dronedarone  pimozide  thioridazine This medicine may also interact with the following medications:  birth control pills  busulfan  carbamazepine  certain medicines that treat or prevent blood clots like warfarin  cimetidine  lithium  other medicines that prolong the QT interval (cause an abnormal heart rhythm)  phenobarbital  phenytoin This list may not describe all possible interactions. Give your health care provider a list of all the medicines, herbs, non-prescription drugs, or dietary supplements you use. Also tell them if you smoke, drink alcohol, or use illegal drugs. Some items may interact with your medicine. What should I watch for while using this medicine? Tell your health care provider if your symptoms do not start to get better or if they get worse. Some products may contain alcohol. Ask your health care provider if this medicine  contains alcohol. Be sure to tell all health care providers you are taking this medicine. Certain medicines, such as metronidazole and disulfiram, can cause an unpleasant reaction when taken with alcohol. The reaction includes flushing, headache, nausea, vomiting, sweating, and increased thirst. The reaction can last from 30 minutes to several hours. If you are being treated for a sexually transmitted disease (STD), avoid sexual contact until you have finished your treatment. Your sexual partner may also need treatment. Birth control may not work properly while you are taking this medicine. Talk to your health care provider about using an extra method of birth control. What side effects may I  notice from receiving this medicine? Side effects that you should report to your doctor or health care professional as soon as possible:  allergic reactions like skin rash, itching or hives, swelling of the face, lips, or tongue  confusion  fast, irregular heartbeat  light-colored stool  liver injury (dark yellow or brown urine; general ill feeling or flu-like symptoms; loss of appetite, right upper belly pain; unusually weak or tired, yellowing of the eyes or skin)  pain, tingling, numbness in the hands or feet  rash, fever, and swollen lymph nodes  redness, blistering, peeling or loosening of the skin, including inside the mouth  seizures  unusual vaginal discharge, itching, or odor Side effects that usually do not require medical attention (report to your doctor or health care professional if they continue or are bothersome):  change in taste  diarrhea  headache  nausea  stomach pain  vomiting This list may not describe all possible side effects. Call your doctor for medical advice about side effects. You may report side effects to FDA at 1-800-FDA-1088. Where should I keep my medicine? Keep out of the reach of children and pets. Store between 15 and 25 degrees C (59 and 77 degrees F). Protect from light. Get rid of any unused medicine after the expiration date. To get rid of medicines that are no longer needed or have expired:  Take the medicine to a medicine take-back program. Check with your pharmacy or law enforcement to find a location.  If you cannot return the medicine, check the label or package insert to see if the medicine should be thrown out in the garbage or flushed down the toilet. If you are not sure, ask your health care provider. If it is safe to put it in the trash, take the medicine out of the container. Mix the medicine with cat litter, dirt, coffee grounds, or other unwanted substance. Seal the mixture in a bag or container. Put it in the  trash. NOTE: This sheet is a summary. It may not cover all possible information. If you have questions about this medicine, talk to your doctor, pharmacist, or health care provider.  2021 Elsevier/Gold Standard (2019-09-10 09:24:11) Vaginitis  Vaginitis is irritation and swelling of the vagina. Treatment will depend on the cause. What are the causes? It can be caused by:  Bacteria.  Yeast.  A parasite.  A virus.  Low hormone levels.  Bubble baths, scented tampons, and feminine sprays. Other things can change the balance of the yeast and bacteria that live in the vagina. These include:  Antibiotic medicines.  Not being clean enough.  Some birth control methods.  Sex.  Infection.  Diabetes.  A weakened body defense system (immune system). What increases the risk?  Smoking or being around someone who smokes.  Using washes (douches), scented tampons, or scented pads.  Wearing tight  pants or thong underwear.  Using birth control pills or an IUD.  Having sex without a condom or having a lot of partners.  Having an STI.  Using a certain product to kill sperm (nonoxynol-9).  Eating foods that are high in sugar.  Having diabetes.  Having low levels of a female hormone.  Having a weakened body defense system.  Being pregnant or breastfeeding. What are the signs or symptoms?  Fluid coming from the vagina that is not normal.  A bad smell.  Itching, pain, or swelling.  Pain with sex.  Pain or burning when you pee (urinate). Sometimes there are no symptoms. How is this treated? Treatment may include:  Antibiotic creams or pills.  Antifungal medicines.  Medicines to ease symptoms if you have a virus. Your sex partner should also be treated.  Estrogen medicines.  Avoiding scented soaps, sprays, or douches.  Stopping use of products that caused irritation and then using a cream to treat symptoms. Follow these instructions at  home: Lifestyle  Keep the area around your vagina clean and dry. ? Avoid using soap. ? Rinse the area with water.  Until your doctor says it is okay: ? Do not use washes for the vagina. ? Do not use tampons. ? Do not have sex.  Wipe from front to back after going to the bathroom.  When your doctor says it is okay, practice safe sex and use condoms. General instructions  Take over-the-counter and prescription medicines only as told by your doctor.  If you were prescribed an antibiotic medicine, take or use it as told by your doctor. Do not stop taking or using it even if you start to feel better.  Keep all follow-up visits. How is this prevented?  Do not use things that can irritate the vagina, such as fabric softeners. Avoid these products if they are scented: ? Sprays. ? Detergents. ? Tampons. ? Products for cleaning the vagina. ? Soaps or bubble baths.  Let air reach your vagina. To do this: ? Wear cotton underwear. ? Do not wear:  Underwear while you sleep.  Tight pants.  Thong underwear.  Underwear or nylons without a cotton panel. ? Take off any wet clothing, such as bathing suits, as soon as you can. ? Practice safe sex and use condoms. Contact a doctor if:  You have pain in your belly or in the area between your hips.  You have a fever or chills.  Your symptoms last for more than 2-3 days. Get help right away if:  You have a fever and your symptoms get worse all of a sudden. Summary  Vaginitis is irritation and swelling of the vagina.  Treatment will depend on the cause of the condition.  Do not use washes or tampons or have sex until your doctor says it is okay. This information is not intended to replace advice given to you by your health care provider. Make sure you discuss any questions you have with your health care provider. Document Revised: 12/05/2019 Document Reviewed: 12/05/2019 Elsevier Patient Education  [place]  Sex Practicing safe sex means taking steps before and during sex to reduce your risk of:  Getting an STI (sexually transmitted infection).  Giving your partner an STI.  Unwanted or unplanned pregnancy. How to practice safe sex Ways you can practice safe sex  Limit your sexual partners to only one partner who is having sex with only you.  Avoid using alcohol and drugs before having sex.  Alcohol and drugs can affect your judgment.  Before having sex with a new partner: ? Talk to your partner about past partners, past STIs, and drug use. ? Get screened for STIs and discuss the results with your partner. Ask your partner to get screened too.  Check your body regularly for sores, blisters, rashes, or unusual discharge. If you notice any of these problems, visit your health care provider.  Avoid sexual contact if you have symptoms of an infection or you are being treated for an STI.  While having sex, use a condom. Make sure to: ? Use a condom every time you have vaginal, oral, or anal sex. Both females and males should wear condoms during oral sex. ? Keep condoms in place from the beginning to the end of sexual activity. ? Use a latex condom, if possible. Latex condoms offer the best protection. ? Use only water-based lubricants with a condom. Using petroleum-based lubricants or oils will weaken the condom and increase the chance that it will break.   Ways your health care provider can help you practice safe sex  See your health care provider for regular screenings, exams, and tests for STIs.  Talk with your health care provider about what kind of birth control (contraception) is best for you.  Get vaccinated against hepatitis B and human papillomavirus (HPV).  If you are at risk of being infected with HIV (human immunodeficiency virus), talk with your health care provider about taking a prescription medicine to prevent HIV infection. You are at risk for HIV if you: ? Are a man who  has sex with other men. ? Are sexually active with more than one partner. ? Take drugs by injection. ? Have a sex partner who has HIV. ? Have unprotected sex. ? Have sex with someone who has sex with both men and women. ? Have had an STI.   Follow these instructions at home:  Take over-the-counter and prescription medicines only as told by your health care provider.  Keep all follow-up visits. This is important. Where to find more information  Centers for Disease Control and Prevention: http://www.wolf.info/  Planned Parenthood: www.plannedparenthood.org  Office on [employer]: [place] Summary  Practicing safe sex means taking steps before and during sex to reduce your risk getting an STI, giving your partner an STI, and having an unwanted or unplanned pregnancy.  Before having sex with a new partner, talk to your partner about past partners, past STIs, and drug use.  Use a condom every time you have vaginal, oral, or anal sex. Both females and males should wear condoms during oral sex.  Check your body regularly for sores, blisters, rashes, or unusual discharge. If you notice any of these problems, visit your health care provider.  See your health care provider for regular screenings, exams, and tests for STIs. This information is not intended to replace advice given to you by your health care provider. Make sure you discuss any questions you have with your health care provider. Document Revised: 11/11/2019 Document Reviewed: 11/11/2019 Elsevier Patient Education  [place].

## 2020-10-01 NOTE — Progress Notes (Signed)
Established patient visit   Patient: Wendy [name]   DOB: 1976/02/18   45 y.o. Female  MRN: [id number] Visit Date: 10/01/2020  Today's healthcare provider: Marcille Buffy, FNP   Chief Complaint  Patient presents with  . Follow-up    Patient returns office today for STD recheck, patient states that she was told to return back to clinic to check for chlamydia because she tested early. Patient states since last visit she is in a new sexual relationship and has concerns that she might have bacteria vaginosis.    Subjective    HPI HPI    Follow-up     Additional comments: Patient returns office today for STD recheck, patient states that she was told to return back to clinic to check for chlamydia because she tested early. Patient states since last visit she is in a new sexual relationship and has concerns that she might have bacteria vaginosis.        Last edited by Minette Headland, CMA on 10/01/2020  3:19 PM. (History)       Patient  denies any fever, body aches,chills, rash, chest pain, shortness of breath, nausea, vomiting, or diarrhea.  Denies dizziness, lightheadedness, pre syncopal or syncopal episodes.       Medications: Outpatient Medications Prior to Visit  Medication Sig  . ALPRAZolam (XANAX) 0.5 MG tablet TAKE 1/2 TO 1 TABLET TWICE DAILY AS NEEDED FOR ANXIETY  . amphetamine-dextroamphetamine (ADDERALL XR) 15 MG 24 hr capsule Take 1 capsule by mouth every morning.  Marland Kitchen BREO ELLIPTA 100-25 MCG/INH AEPB INHALE 1 PUFF INTO THE LUNGS DAILY  . Cholecalciferol (VITAMIN D PO) Take by mouth.  Marland Kitchen MAGNESIUM CARBONATE PO Take by mouth.  . Multiple Vitamin (MULTI VITAMIN DAILY PO) Take by mouth.  Marland Kitchen PROAIR HFA 108 (90 Base) MCG/ACT inhaler INHALE 2 PUFFS INTO THE LUNGS EVERY 4-6 HOURS AS NEEDED  . tretinoin (RETIN-A) 0.1 % cream Apply topically at bedtime.  . vortioxetine HBr (TRINTELLIX) 20 MG TABS tablet Take 1 tablet (20 mg total) by mouth daily.  . [DISCONTINUED]  metroNIDAZOLE (FLAGYL) 500 MG tablet Take 1 tablet (500 mg total) by mouth 2 (two) times daily.  Marland Kitchen doxycycline (VIBRA-TABS) 100 MG tablet Take 1 tablet (100 mg total) by mouth 2 (two) times daily. (Patient not taking: Reported on 10/01/2020)   No facility-administered medications prior to visit.    Review of Systems  Constitutional: Negative.   Respiratory: Negative.   Cardiovascular: Negative.   Genitourinary: Positive for vaginal discharge.  Skin: Negative.   Psychiatric/Behavioral: Negative.        Objective    BP 110/72   Pulse 82   Resp 16   Wt 205 lb 3.2 oz (93.1 kg)   LMP 09/30/2020   SpO2 97%   BMI 36.35 kg/m  BP Readings from Last 3 Encounters:  10/01/20 110/72  08/18/20 112/74  06/22/20 120/66   Wt Readings from Last 3 Encounters:  10/01/20 205 lb 3.2 oz (93.1 kg)  08/18/20 211 lb 9.6 oz (96 kg)  06/22/20 205 lb (93 kg)       Physical Exam Exam conducted with a chaperone present.  Constitutional:      General: She is not in acute distress.    Appearance: Normal appearance. She is not ill-appearing, toxic-appearing or diaphoretic.  Cardiovascular:     Rate and Rhythm: Normal rate and regular rhythm.     Pulses: Normal pulses.     Heart sounds: Normal heart sounds.  No murmur heard. No friction rub. No gallop.   Pulmonary:     Effort: Pulmonary effort is normal. No respiratory distress.     Breath sounds: Normal breath sounds. No stridor. No wheezing, rhonchi or rales.  Chest:     Chest wall: No tenderness.  Genitourinary:    Exam position: Lithotomy position.     Vagina: No signs of injury and foreign body. Vaginal discharge (white discharge. ) present. No erythema, tenderness, bleeding, lesions or prolapsed vaginal walls.     Cervix: Normal.     Uterus: Normal.      Adnexa: Right adnexa normal and left adnexa normal.  Musculoskeletal:     Cervical back: Neck supple.     Results for orders placed or performed in visit on 10/01/20  NuSwab  Vaginitis Plus (VG+)  Result Value Ref Range   Atopobium vaginae Moderate - 1 Score   BVAB 2 Low - 0 Score   Megasphaera 1 Low - 0 Score   Candida albicans, NAA Negative Negative   Candida glabrata, NAA Negative Negative   Trich vag by NAA Negative Negative   Chlamydia trachomatis, NAA Negative Negative   Neisseria gonorrhoeae, NAA Negative Negative  Specimen status report  Result Value Ref Range   specimen status report Comment     Assessment & Plan     Acute vaginitis - Plan: NuSwab Vaginitis Plus (VG+)  Possible exposure to STD  Meds ordered this encounter  Medications  . metroNIDAZOLE (FLAGYL) 500 MG tablet    Sig: Take 1 tablet (500 mg total) by mouth 2 (two) times daily.    Dispense:  14 tablet    Refill:  0  . terconazole (TERAZOL 7) 0.4 % vaginal cream    Sig: Place 1 applicator vaginally at bedtime for 5 days.    Dispense:  45 g    Refill:  0   Red Flags discussed. The patient was given clear instructions to go to ER or return to medical center if any red flags develop, symptoms do not improve, worsen or new problems develop. They verbalized understanding.    Return in about 2 weeks (around 10/15/2020), or if symptoms worsen or fail to improve, for at any time for any worsening symptoms, Go to Emergency room/ urgent care if worse.     The entirety of the information documented in the History of Present Illness, Review of Systems and Physical Exam were personally obtained by me. Portions of this information were initially documented by the CMA and reviewed by me for thoroughness and accuracy.   Marcille Buffy, [place] [phone] (phone) [phone] (fax)  [place]

## 2020-10-03 LAB — NUSWAB VAGINITIS PLUS (VG+)
Candida albicans, NAA: NEGATIVE
Candida glabrata, NAA: NEGATIVE
Chlamydia trachomatis, NAA: NEGATIVE
Neisseria gonorrhoeae, NAA: NEGATIVE
Trich vag by NAA: NEGATIVE

## 2020-10-03 LAB — SPECIMEN STATUS REPORT

## 2020-10-05 NOTE — Progress Notes (Signed)
NuSwab is negative, return to office for follow up and additional testing if symptoms not resolved completely.

## 2020-10-06 ENCOUNTER — Other Ambulatory Visit: Payer: Self-pay | Admitting: Family Medicine

## 2020-10-06 DIAGNOSIS — Z1231 Encounter for screening mammogram for malignant neoplasm of breast: Secondary | ICD-10-CM

## 2020-10-16 ENCOUNTER — Other Ambulatory Visit: Payer: Self-pay | Admitting: Family Medicine

## 2020-10-16 DIAGNOSIS — F900 Attention-deficit hyperactivity disorder, predominantly inattentive type: Secondary | ICD-10-CM

## 2020-10-16 NOTE — Telephone Encounter (Signed)
Medication Refill - Medication: Adderall   Has the patient contacted their pharmacy? No. Pt states that she always has to call office to refill medication. Please advise.  (Agent: If no, request that the patient contact the pharmacy for the refill.) (Agent: If yes, when and what did the pharmacy advise?)  Preferred Pharmacy (with phone number or street name):  [id number] [place] [address]  [address] [place] [address]  Phone: [phone] Fax: [phone]  Hours: Not open 24 hours   Please be advised that RX refills may take up to 3 business days. We ask that you follow-up with your pharmacy.

## 2020-10-16 NOTE — Telephone Encounter (Signed)
Requested medication (s) are due for refill today: Yes  Requested medication (s) are on the active medication list: Yes  Last refill:  09/15/20  Future visit scheduled: Yes  Notes to clinic:  See request.    Requested Prescriptions  Pending Prescriptions Disp Refills   amphetamine-dextroamphetamine (ADDERALL XR) 15 MG 24 hr capsule 30 capsule 0    Sig: Take 1 capsule by mouth every morning.      Not Delegated - Psychiatry:  Stimulants/ADHD Failed - 10/16/2020  3:22 PM      Failed - This refill cannot be delegated      Failed - Urine Drug Screen completed in last 360 days      Passed - Valid encounter within last 3 months    Recent Outpatient Visits           2 weeks ago Acute vaginitis   [place] [facility], Kelby Aline, FNP   1 month ago Exposure to [employer], Kelby Aline, FNP   2 months ago History of [place] [facility], Dionne Bucy, MD   3 months ago Encounter for IUD removal   [facility], Dionne Bucy, MD   4 months ago Annual physical exam   [place], [place]       Future Appointments             In 7 months Marlyn Corporal, Clearnce Sorrel, PA-C [employer], [place]

## 2020-10-19 MED ORDER — AMPHETAMINE-DEXTROAMPHET ER 15 MG PO CP24: 15.0000 mg | ORAL_CAPSULE | ORAL | 0 refills | Status: DC

## 2020-10-29 ENCOUNTER — Ambulatory Visit
Admission: RE | Admit: 2020-10-29 | Discharge: 2020-10-29 | Disposition: A | Discharge status: Home / Self Care | Payer: 59 | Source: Ambulatory Visit | Attending: Family Medicine | Admitting: Family Medicine

## 2020-10-29 ENCOUNTER — Other Ambulatory Visit: Payer: Self-pay

## 2020-10-29 DIAGNOSIS — Z1231 Encounter for screening mammogram for malignant neoplasm of breast: Secondary | ICD-10-CM | POA: Diagnosis not present

## 2020-11-20 ENCOUNTER — Other Ambulatory Visit: Payer: Self-pay | Admitting: Family Medicine

## 2020-11-20 DIAGNOSIS — F900 Attention-deficit hyperactivity disorder, predominantly inattentive type: Secondary | ICD-10-CM

## 2020-11-20 NOTE — Telephone Encounter (Signed)
Medication Refill - Medication: adderall xr 15 mg  Has the patient contacted their pharmacy? Yes was told to call md office [facility]erred Pharmacy (with phone number [facility]  [address] in [place] number [phone]  Agent: Please be advised that RX refills may take up to 3 business days. We ask that you follow-up with your pharmacy.

## 2020-11-20 NOTE — Telephone Encounter (Signed)
Requested medication (s) are due for refill today: yes  Requested medication (s) are on the active medication list: yes  Last refill:  10/19/2020  Future visit scheduled: yes   Notes to clinic:  this refill cannot be delegated    Requested Prescriptions  Pending Prescriptions Disp Refills   amphetamine-dextroamphetamine (ADDERALL XR) 15 MG 24 hr capsule 30 capsule 0    Sig: Take 1 capsule by mouth every morning.      There is no refill protocol information for this order

## 2020-11-23 MED ORDER — AMPHETAMINE-DEXTROAMPHET ER 15 MG PO CP24: 15.0000 mg | ORAL_CAPSULE | ORAL | 0 refills | Status: DC

## 2020-11-23 NOTE — Telephone Encounter (Signed)
Pt called to check the status of her refill request / please advise

## 2020-12-19 ENCOUNTER — Other Ambulatory Visit: Payer: Self-pay | Admitting: Physician Assistant

## 2020-12-19 DIAGNOSIS — J4520 Mild intermittent asthma, uncomplicated: Secondary | ICD-10-CM

## 2021-01-13 ENCOUNTER — Other Ambulatory Visit: Payer: Self-pay | Admitting: Family Medicine

## 2021-01-13 DIAGNOSIS — F900 Attention-deficit hyperactivity disorder, predominantly inattentive type: Secondary | ICD-10-CM

## 2021-01-14 NOTE — Telephone Encounter (Signed)
Needs  to schedule a f/u appt before we can give a refill.

## 2021-02-23 ENCOUNTER — Other Ambulatory Visit: Payer: Self-pay | Admitting: Family Medicine

## 2021-02-23 DIAGNOSIS — F900 Attention-deficit hyperactivity disorder, predominantly inattentive type: Secondary | ICD-10-CM

## 2021-02-23 NOTE — Telephone Encounter (Signed)
Medication Refill - Medication: Adderall   Has the patient contacted their pharmacy? No. Pts mother, Adela Lank, calling on behalf of pt. She states that she always has to call this into office. Please advise . (Agent: If no, request that the patient contact the pharmacy for the refill.) (Agent: If yes, when and what did the pharmacy advise?)  Preferred Pharmacy (with phone number or street name):  [id number]  [address] Phone: [phone]: [phone] Hours: Not open 24 hours    Agent: Please be advised that RX refills may take up to 3 business days. We ask that you follow-up with your pharmacy.

## 2021-02-25 MED ORDER — AMPHETAMINE-DEXTROAMPHET ER 15 MG PO CP24: 15.0000 mg | ORAL_CAPSULE | ORAL | 0 refills | Status: DC

## 2021-03-10 ENCOUNTER — Telehealth: Payer: 59 | Admitting: Nurse Practitioner

## 2021-03-10 DIAGNOSIS — L03319 Cellulitis of trunk, unspecified: Secondary | ICD-10-CM | POA: Diagnosis not present

## 2021-03-10 MED ORDER — SULFAMETHOXAZOLE-TRIMETHOPRIM 800-160 MG PO TABS: 1.0000 | ORAL_TABLET | Freq: Two times a day (BID) | ORAL | 0 refills | Status: DC

## 2021-03-10 NOTE — Progress Notes (Signed)
Virtual Visit Consent   Wendy [name], you are scheduled for a virtual visit with Mary-Margaret Hassell Done, [place], a [facility] provider, today.     Just as with appointments in the office, your consent must be obtained to participate.  Your consent will be active for this visit and any virtual visit you may have with one of our providers in the next 365 days.     If you have a MyChart account, a copy of this consent can be sent to you electronically.  All virtual visits are billed to your insurance company just like a traditional visit in the office.    As this is a virtual visit, video technology does not allow for your provider to perform a traditional examination.  This may limit your provider's ability to fully assess your condition.  If your provider identifies any concerns that need to be evaluated in person or the need to arrange testing (such as labs, EKG, etc.), we will make arrangements to do so.     Although advances in technology are sophisticated, we cannot ensure that it will always work on either your end or our end.  If the connection with a video visit is poor, the visit may have to be switched to a telephone visit.  With either a video or telephone visit, we are not always able to ensure that we have a secure connection.     I need to obtain your verbal consent now.   Are you willing to proceed with your visit today? YES   Wendy [name] has provided verbal consent on 03/10/2021 for a virtual visit (video or telephone).   Mary-Margaret Hassell Done, FNP   Date: 03/10/2021 3:54 PM   Virtual Visit via Video Note   I, Mary-Margaret Hassell Done, connected with Wendy [name] ([id number], 12/25/1975) on 03/10/21 at  4:15 PM EDT by a video-enabled telemedicine application and verified that I am speaking with the correct person using two identifiers.  Location: Patient: Virtual Visit Location Patient: Home Provider: Virtual Visit Location Provider: Mobile   I discussed the limitations of  evaluation and management by telemedicine and the availability of in person appointments. The patient expressed understanding and agreed to proceed.    History of Present Illness: Wendy [name] is a 45 y.o. who identifies as a female who was assigned female at birth, and is being seen today for cellulitis.  HPI: Patient states she felt a bump on her left flank yesterday that has gotten bigger and is now sore to the touch. She has not been around anyone with rash.   Problems:  Patient Active Problem List   Diagnosis Date Noted   Bacterial vaginosis 08/24/2020   Chlamydia 08/18/2020   Exposure to STD 08/18/2020   Recurrent major depressive disorder, in partial remission ([place]) 06/03/2020   GAD (generalized anxiety disorder) 11/05/2015   ADHD (attention deficit hyperactivity disorder) 11/05/2015   Obese 11/05/2015   Urinary incontinence in female 11/05/2015   Constipation 11/05/2015   Menstrual migraine 11/05/2015   Contraception management 05/02/2012   Depression 02/13/2012   Abnormal Pap smear of cervix 10/19/2011    Allergies:  Allergies  Allergen Reactions   Peanut (Diagnostic) Shortness Of Breath   Peanuts [Peanut Oil] Shortness Of Breath   Banana Itching   Other Other (See Comments)    Walnuts- makes tongue blister   Codeine Nausea And Vomiting   Eggs Or Egg-Derived Products Nausea And Vomiting   Penicillins    Vicodin [Hydrocodone-Acetaminophen] Nausea And Vomiting  Medications:  Current Outpatient Medications:    albuterol (VENTOLIN HFA) 108 (90 Base) MCG/ACT inhaler, INHALE 2 PUFFS INTO THE LUNGS EVERY 4-6 HOURS AS NEEDED, Disp: 8.5 g, Rfl: 0   ALPRAZolam (XANAX) 0.5 MG tablet, TAKE 1/2 TO 1 TABLET TWICE DAILY AS NEEDED FOR ANXIETY, Disp: 60 tablet, Rfl: 5   amphetamine-dextroamphetamine (ADDERALL XR) 15 MG 24 hr capsule, Take 1 capsule by mouth every morning., Disp: 30 capsule, Rfl: 0   BREO ELLIPTA 100-25 MCG/INH AEPB, INHALE 1 PUFF INTO THE LUNGS DAILY, Disp: 60  each, Rfl: 3   Cholecalciferol (VITAMIN D PO), Take by mouth., Disp: , Rfl:    doxycycline (VIBRA-TABS) 100 MG tablet, Take 1 tablet (100 mg total) by mouth 2 (two) times daily. (Patient not taking: Reported on 10/01/2020), Disp: 20 tablet, Rfl: 0   MAGNESIUM CARBONATE PO, Take by mouth., Disp: , Rfl:    metroNIDAZOLE (FLAGYL) 500 MG tablet, Take 1 tablet (500 mg total) by mouth 2 (two) times daily., Disp: 14 tablet, Rfl: 0   Multiple Vitamin (MULTI VITAMIN DAILY PO), Take by mouth., Disp: , Rfl:    tretinoin (RETIN-A) 0.1 % cream, Apply topically at bedtime., Disp: 45 g, Rfl: 1   vortioxetine HBr (TRINTELLIX) 20 MG TABS tablet, Take 1 tablet (20 mg total) by mouth daily., Disp: 90 tablet, Rfl: 3  Observations/Objective: Patient is well-developed, well-nourished in no acute distress.  Resting comfortably  at home.  Head is normocephalic, atraumatic.  No labored breathing.  Speech is clear and coherent with logical content.  Patient is alert and oriented at baseline.  Erythematous maculopapular lesion about the size of a quarter on left flank- mildly sore to touch, no drainage  Assessment and Plan:  Paulita Cradle in today with chief complaint of Cellulitis   1. Cellulitis of trunk, unspecified site of trunk May be insect bote or spider bite Do not pick or scratch Keep covered  Let us know if worsens Meds ordered this encounter  Medications   sulfamethoxazole-trimethoprim (BACTRIM DS) 800-160 MG tablet    Sig: Take 1 tablet by mouth 2 (two) times daily.    Dispense:  14 tablet    Refill:  0    Order Specific Question:   Supervising Provider    Answer:   Noemi Chapel [[id number]]       Follow Up Instructions: I discussed the assessment and treatment plan with the patient. The patient was provided an opportunity to ask questions and all were answered. The patient agreed with the plan and demonstrated an understanding of the instructions.  A copy of instructions were sent to the patient  via MyChart.  The patient was advised to call back or seek an in-person evaluation if the symptoms worsen or if the condition fails to improve as anticipated.  Time:  I spent 12 minutes with the patient via telehealth technology discussing the above problems/concerns.    Mary-Margaret Hassell Done, FNP

## 2021-04-08 DIAGNOSIS — M542 Cervicalgia: Secondary | ICD-10-CM | POA: Insufficient documentation

## 2021-04-08 DIAGNOSIS — M5412 Radiculopathy, cervical region: Secondary | ICD-10-CM | POA: Insufficient documentation

## 2021-04-25 ENCOUNTER — Telehealth: Payer: Self-pay | Admitting: Family Medicine

## 2021-04-25 DIAGNOSIS — F900 Attention-deficit hyperactivity disorder, predominantly inattentive type: Secondary | ICD-10-CM

## 2021-04-29 NOTE — Telephone Encounter (Signed)
Please review request. KW 

## 2021-04-29 NOTE — Telephone Encounter (Signed)
Patient called in to inquire of the refill on her medication. Would like a call back please ASAP say she is having headaches without this medication

## 2021-04-29 NOTE — Telephone Encounter (Signed)
Pt's mom called saying she needs this refill filled this time.  She did make an appt for 05/18/21 with Tally Joe.

## 2021-04-30 ENCOUNTER — Other Ambulatory Visit: Payer: Self-pay | Admitting: Family Medicine

## 2021-04-30 DIAGNOSIS — F900 Attention-deficit hyperactivity disorder, predominantly inattentive type: Secondary | ICD-10-CM

## 2021-04-30 MED ORDER — AMPHETAMINE-DEXTROAMPHET ER 15 MG PO CP24: 15.0000 mg | ORAL_CAPSULE | ORAL | 0 refills | Status: DC

## 2021-05-05 ENCOUNTER — Other Ambulatory Visit: Payer: Self-pay | Admitting: Family Medicine

## 2021-05-05 DIAGNOSIS — J4520 Mild intermittent asthma, uncomplicated: Secondary | ICD-10-CM

## 2021-05-05 NOTE — Telephone Encounter (Signed)
Requested Prescriptions  Pending Prescriptions Disp Refills  . albuterol (VENTOLIN HFA) 108 (90 Base) MCG/ACT inhaler [Pharmacy Med Name: ALBUTEROL SULFATE HFA 108 (90 BASE)] 8.5 g 0    Sig: INHALE 2 PUFFS INTO THE LUNGS EVERY 4-6 HOURS AS NEEDED     Pulmonology:  Beta Agonists Failed - 05/05/2021  1:43 PM      Failed - One inhaler should last at least one month. If the patient is requesting refills earlier, contact the patient to check for uncontrolled symptoms.      Passed - Valid encounter within last 12 months    Recent Outpatient Visits          7 months ago Acute vaginitis   [place] [facility], Kelby Aline, FNP   8 months ago Exposure to STD   [place], Kelby Aline, FNP   9 months ago History of [place] [facility], Dionne Bucy, MD   10 months ago Encounter for IUD removal   [facility], Dionne Bucy, MD   11 months ago Annual physical exam   [facility], Clearnce Sorrel, [place]      Future Appointments            In 1 week [name], [place], [place]

## 2021-05-10 ENCOUNTER — Ambulatory Visit
Admission: EM | Admit: 2021-05-10 | Discharge: 2021-05-10 | Disposition: A | Discharge status: Home / Self Care | Payer: 59 | Attending: Family Medicine | Admitting: Family Medicine

## 2021-05-10 ENCOUNTER — Encounter: Payer: Self-pay | Admitting: Emergency Medicine

## 2021-05-10 ENCOUNTER — Ambulatory Visit: Payer: Self-pay | Admitting: *Deleted

## 2021-05-10 DIAGNOSIS — K1121 Acute sialoadenitis: Secondary | ICD-10-CM | POA: Diagnosis not present

## 2021-05-10 DIAGNOSIS — B9689 Other specified bacterial agents as the cause of diseases classified elsewhere: Secondary | ICD-10-CM

## 2021-05-10 MED ORDER — SULFAMETHOXAZOLE-TRIMETHOPRIM 800-160 MG PO TABS: 1.0000 | ORAL_TABLET | Freq: Two times a day (BID) | ORAL | 0 refills | Status: DC

## 2021-05-10 MED ORDER — SULFAMETHOXAZOLE-TRIMETHOPRIM 800-160 MG PO TABS: 1.0000 | ORAL_TABLET | Freq: Two times a day (BID) | ORAL | 0 refills | Status: AC

## 2021-05-10 NOTE — Telephone Encounter (Signed)
Woke up with swollen area on the left underside of her tongue. Sore to touch. Felt a pinching sensation at the area before bed last night. No bumps, sores noted. No difficulty swallowing, no difficulty breathing. Cold symptoms last week, may have had low grade fever then. No natural products taken, OTC cold products. Advised UC today.     Reason for Disposition  Pain in tongue, mouth, or tooth  Answer Assessment - Initial Assessment Questions 1. ONSET: "When did the swelling start?" (e.g., minutes, hours, days)     This morning 2. LOCATION: "What part of the tongue is swollen?"     Under left side 3. SEVERITY: "How swollen is it?"     Enough it is noticeable  4. CAUSE: "What do you think is causing the tongue swelling?" (e.g., hx of angioedema, allergies)     No idea 5. RECURRENT SYMPTOM: "Have you had tongue swelling before?" If Yes, ask: "When was the last time?" "What happened that time?"     no 6. OTHER SYMPTOMS: "Do you have any other symptoms?" (e.g., difficulty breathing, facial swelling)     no 7. PREGNANCY: "Is there any chance you are pregnant?" "When was your last menstrual period?"     Did not ask  Protocols used: Tongue Swelling-A-AH

## 2021-05-10 NOTE — ED Triage Notes (Signed)
Pt here with sharp pain under tongue under left side of jaw and down left side of throat.

## 2021-05-10 NOTE — ED Provider Notes (Signed)
[facility]    CSN: [id number] Arrival date & time: 05/10/21  1745      History   Chief Complaint Chief Complaint  Patient presents with   Mouth Pain   Jaw Pain    HPI Wendy [name] is a 45 y.o. female.   HPI Patient presents with sharp pain under tongue and left-sided cervical adenopathy. Onset of symptoms 2 days ago. Pain characterized as sharp under the tongue. Patient is s/p flu like illness in which she experienced fever,body aches, and URI symptoms.  She is afebrile.    Past Medical History:  Diagnosis Date   Abnormal Pap smear of cervix 10/2011   Allergy    Anxiety    Depression    Heart murmur    slight    Patient Active Problem List   Diagnosis Date Noted   Bacterial vaginosis 08/24/2020   Chlamydia 08/18/2020   Exposure to STD 08/18/2020   Recurrent major depressive disorder, in partial remission ([place]) 06/03/2020   GAD (generalized anxiety disorder) 11/05/2015   ADHD (attention deficit hyperactivity disorder) 11/05/2015   Obese 11/05/2015   Urinary incontinence in female 11/05/2015   Constipation 11/05/2015   Menstrual migraine 11/05/2015   Contraception management 05/02/2012   Depression 02/13/2012   Abnormal Pap smear of cervix 10/19/2011    Past Surgical History:  Procedure Laterality Date   CESAREAN SECTION  2004   x1   LAPAROSCOPIC TUBAL LIGATION  05/02/2012   Procedure: LAPAROSCOPIC TUBAL LIGATION;  Surgeon: Emily Filbert, MD;  Location: [facility];  Service: Gynecology;  Laterality: Bilateral;  with filshie clips   TUBAL LIGATION  04/2012   WISDOM TOOTH EXTRACTION     x4   WRIST SURGERY     rigth cyst removal.    OB History     Gravida  2   Para  2   Term      Preterm      AB      Living  2      SAB      IAB      Ectopic      Multiple      Live Births  2            Home Medications    Prior to Admission medications   Medication Sig Start Date End Date Taking? Authorizing Provider  albuterol  (VENTOLIN HFA) 108 (90 Base) MCG/ACT inhaler INHALE 2 PUFFS INTO THE LUNGS EVERY 4-6 HOURS AS NEEDED 05/05/21   [name], FNP  ALPRAZolam Duanne Moron) 0.5 MG tablet TAKE 1/2 TO 1 TABLET TWICE DAILY AS NEEDED FOR ANXIETY 12/05/19   Mar Daring, PA-C  amphetamine-dextroamphetamine (ADDERALL XR) 15 MG 24 hr capsule Take 1 capsule by mouth every morning. 04/30/21   [name], FNP  BREO ELLIPTA 100-25 MCG/INH AEPB INHALE 1 PUFF INTO THE LUNGS DAILY 09/07/20   Virginia Crews, MD  Cholecalciferol (VITAMIN D PO) Take by mouth.    [provider]  MAGNESIUM CARBONATE PO Take by mouth.    [provider]  Multiple Vitamin (MULTI VITAMIN DAILY PO) Take by mouth.    [provider]  sulfamethoxazole-trimethoprim (BACTRIM DS) 800-160 MG tablet Take 1 tablet by mouth 2 (two) times daily for 7 days. 05/10/21 05/17/21  Scot Jun, FNP  tretinoin (RETIN-A) 0.1 % cream Apply topically at bedtime. 06/22/20   Virginia Crews, MD  vortioxetine HBr (TRINTELLIX) 20 MG TABS tablet Take 1 tablet (20 mg  total) by mouth daily. 08/15/19   Mar Daring, PA-C    Family History Family History  Problem Relation Age of Onset   Arthritis Mother    Hypothyroidism Mother    Heart disease Father        heart attack   Alcohol abuse Father    Heart disease Maternal Grandmother    Emphysema Maternal Grandfather    Heart disease Maternal Grandfather    Asthma Paternal Grandmother    Heart disease Paternal Grandfather    Breast cancer Neg Hx     Social History Social History   Tobacco Use   Smoking status: Never   Smokeless tobacco: Never  Vaping Use   Vaping Use: Never used  Substance Use Topics   Alcohol use: Yes    Comment: occasional/rare   Drug use: No     Allergies   Peanut (diagnostic), Peanuts [peanut oil], Banana, Other, Codeine, Eggs or egg-derived products, Penicillins, and Vicodin [hydrocodone-acetaminophen]   Review of Systems Review  of Systems Pertinent negatives listed in HPI   Physical Exam Triage Vital Signs ED Triage Vitals  Enc Vitals Group     BP 05/10/21 1851 120/67     Pulse Rate 05/10/21 1851 85     Resp 05/10/21 1851 18     Temp 05/10/21 1851 98.7 F (37.1 C)     Temp Source 05/10/21 1851 Oral     SpO2 05/10/21 1851 98 %     Weight --      Height --      Head Circumference --      Peak Flow --      Pain Score 05/10/21 1856 5     Pain Loc --      Pain Edu? --      Excl. in [place]? --    No data found.  Updated Vital Signs BP 120/67 (BP Location: Left Arm)   Pulse 85   Temp 98.7 F (37.1 C) (Oral)   Resp 18   SpO2 98%   Visual Acuity Right Eye Distance:   Left Eye Distance:   Bilateral Distance:    Right Eye Near:   Left Eye Near:    Bilateral Near:     Physical Exam Constitutional:      Appearance: She is ill-appearing.  HENT:     Head:     Jaw: Pain on movement present.     Salivary Glands: Left salivary gland is diffusely enlarged and tender.     Mouth/Throat:     Mouth: Mucous membranes are moist.  Cardiovascular:     Rate and Rhythm: Normal rate and regular rhythm.  Pulmonary:     Effort: Pulmonary effort is normal.     Breath sounds: Normal breath sounds.  Skin:    General: Skin is warm and dry.     Capillary Refill: Capillary refill takes less than 2 seconds.  Neurological:     General: No focal deficit present.     Mental Status: She is alert.  Psychiatric:        Mood and Affect: Mood normal.        Behavior: Behavior normal.   UC Treatments / Results  Labs (all labs ordered are listed, but only abnormal results are displayed) Labs Reviewed - No data to display  EKG   Radiology No results found.  Procedures Procedures (including critical care time)  Medications Ordered in UC Medications - No data to display  Initial Impression / Assessment and Plan /  UC Course  I have reviewed the triage vital signs and the nursing notes.  Pertinent labs &  imaging results that were available during my care of the patient were reviewed by me and considered in my medical decision making (see chart for details).    Acute sialadenitis Bactrim BID x 7 days  Tylenol or Ibuprofen  RTC PRN Final Clinical Impressions(s) / UC Diagnoses   Final diagnoses:  Acute bacterial sialadenitis   Discharge Instructions   None    ED Prescriptions     Medication Sig Dispense Auth. Provider   sulfamethoxazole-trimethoprim (BACTRIM DS) 800-160 MG tablet  (Status: Discontinued) Take 1 tablet by mouth 2 (two) times daily for 7 days. 14 tablet Scot Jun, FNP   sulfamethoxazole-trimethoprim (BACTRIM DS) 800-160 MG tablet Take 1 tablet by mouth 2 (two) times daily for 7 days. 14 tablet Scot Jun, FNP      PDMP not reviewed this encounter.   Scot Jun, FNP 05/10/21 1926

## 2021-05-11 ENCOUNTER — Ambulatory Visit: Payer: Self-pay

## 2021-05-11 NOTE — Telephone Encounter (Signed)
Pt. Seen yesterday in UC with "lump under my tongue I was started on Bactrim." States she feels like the swelling outside her neck on left side "is worse." No difficulty swallowing. No fever. States she is COVID 19 negative. Practice currently closed. No availability with PCP. Requesting to be worked in.    Answer Assessment - Initial Assessment Questions 1. ONSET: "When did the throat start hurting?" (Hours or days ago)      2 days ago 2. SEVERITY: "How bad is the sore throat?" (Scale 1-10; mild, moderate or severe)   - MILD (1-3):  doesn't interfere with eating or normal activities   - MODERATE (4-7): interferes with eating some solids and normal activities   - SEVERE (8-10):  excruciating pain, interferes with most normal activities   - SEVERE DYSPHAGIA: can't swallow liquids, drooling     Severe 3. STREP EXPOSURE: "Has there been any exposure to strep within the past week?" If Yes, ask: "What type of contact occurred?"      No 4.  VIRAL SYMPTOMS: "Are there any symptoms of a cold, such as a runny nose, cough, hoarse voice or red eyes?"      Lump under tongue 5. FEVER: "Do you have a fever?" If Yes, ask: "What is your temperature, how was it measured, and when did it start?"     No 6. PUS ON THE TONSILS: "Is there pus on the tonsils in the back of your throat?"     No 7. OTHER SYMPTOMS: "Do you have any other symptoms?" (e.g., difficulty breathing, headache, rash)     No 8. PREGNANCY: "Is there any chance you are pregnant?" "When was your last menstrual period?"     No  Protocols used: Sore Throat-A-AH

## 2021-05-11 NOTE — Telephone Encounter (Signed)
Can you look over your schedule to see if this afternoon or tomorrow would be a good day to fit patient in? We are fully booked today and tomorrow. KW

## 2021-05-11 NOTE — Telephone Encounter (Signed)
[id number] 

## 2021-05-18 ENCOUNTER — Ambulatory Visit (INDEPENDENT_AMBULATORY_CARE_PROVIDER_SITE_OTHER): Payer: 59 | Admitting: Family Medicine

## 2021-05-18 ENCOUNTER — Other Ambulatory Visit: Payer: Self-pay

## 2021-05-18 ENCOUNTER — Encounter: Payer: Self-pay | Admitting: Family Medicine

## 2021-05-18 VITALS — BP 102/69 | HR 76 | Resp 16 | Ht 63.0 in | Wt 216.2 lb

## 2021-05-18 DIAGNOSIS — Z789 Other specified health status: Secondary | ICD-10-CM | POA: Insufficient documentation

## 2021-05-18 DIAGNOSIS — F900 Attention-deficit hyperactivity disorder, predominantly inattentive type: Secondary | ICD-10-CM | POA: Diagnosis not present

## 2021-05-18 DIAGNOSIS — Z6838 Body mass index (BMI) 38.0-38.9, adult: Secondary | ICD-10-CM | POA: Insufficient documentation

## 2021-05-18 MED ORDER — METFORMIN HCL ER 750 MG PO TB24: 750.0000 mg | ORAL_TABLET | Freq: Every day | ORAL | 1 refills | Status: DC

## 2021-05-18 NOTE — Telephone Encounter (Signed)
Patient was seen today.

## 2021-05-18 NOTE — Assessment & Plan Note (Signed)
Chronic stable, continue medication Intermittent work hours Takes medication for long work days without breaks

## 2021-05-18 NOTE — Progress Notes (Signed)
Established patient visit   Patient: Wendy [name]   DOB: Nov 16, 1975   45 y.o. Female  MRN: [id number] Visit Date: 05/18/2021  Today's healthcare provider: [name], FNP   Chief Complaint  Patient presents with   Follow-up    ADD    Subjective    HPI HPI     Follow-up    Additional comments: ADD      Last edited by Doristine Devoid, CMA on 05/18/2021  9:15 AM.      Patient here for follow up on attention deficit disorder. Stable on Adderall 15mg .  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  Medications: Outpatient Medications Prior to Visit  Medication Sig   albuterol (VENTOLIN HFA) 108 (90 Base) MCG/ACT inhaler INHALE 2 PUFFS INTO THE LUNGS EVERY 4-6 HOURS AS NEEDED   ALPRAZolam (XANAX) 0.5 MG tablet TAKE 1/2 TO 1 TABLET TWICE DAILY AS NEEDED FOR ANXIETY   amphetamine-dextroamphetamine (ADDERALL XR) 15 MG 24 hr capsule Take 1 capsule by mouth every morning.   BREO ELLIPTA 100-25 MCG/INH AEPB INHALE 1 PUFF INTO THE LUNGS DAILY   Cholecalciferol (VITAMIN D PO) Take by mouth.   MAGNESIUM CARBONATE PO Take by mouth.   Multiple Vitamin (MULTI VITAMIN DAILY PO) Take by mouth.   tretinoin (RETIN-A) 0.1 % cream Apply topically at bedtime.   vortioxetine HBr (TRINTELLIX) 20 MG TABS tablet Take 1 tablet (20 mg total) by mouth daily. (Patient not taking: Reported on 05/18/2021)   No facility-administered medications prior to visit.    Review of Systems     Objective    BP 102/69 (BP Location: Right Arm, Patient Position: Sitting, Cuff Size: Large)   Pulse 76   Resp 16   Ht 5\' 3"  (1.6 m)   Wt 216 lb 3.2 oz (98.1 kg)   BMI 38.30 kg/m  {Show previous vital signs (optional):23777}  Physical Exam Vitals and nursing note reviewed.  Constitutional:      General: She is not in acute distress.     Appearance: Normal appearance. She is obese. She is not ill-appearing, toxic-appearing or diaphoretic.  HENT:     Head: Normocephalic and atraumatic.  Cardiovascular:     Rate and Rhythm: Normal rate and regular rhythm.     Pulses: Normal pulses.     Heart sounds: Normal heart sounds. No murmur heard.   No friction rub. No gallop.  Pulmonary:     Effort: Pulmonary effort is normal. No respiratory distress.     Breath sounds: Normal breath sounds. No stridor. No wheezing, rhonchi or rales.  Chest:     Chest wall: No tenderness.  Abdominal:     General: Bowel sounds are normal.     Palpations: Abdomen is soft.  Musculoskeletal:        General: No swelling, tenderness, deformity or signs of injury. Normal range of motion.     Right lower leg: No edema.     Left lower leg: No edema.  Skin:    General: Skin is warm and dry.     Capillary Refill: Capillary refill takes less than 2 seconds.     Coloration: Skin is not jaundiced or pale.     Findings: No bruising, erythema, lesion or rash.  Neurological:     General: No focal deficit present.  Mental Status: She is alert and oriented to person, place, and time. Mental status is at baseline.     Cranial Nerves: No cranial nerve deficit.     Sensory: No sensory deficit.     Motor: No weakness.     Coordination: Coordination normal.  Psychiatric:        Mood and Affect: Mood normal.        Behavior: Behavior normal.        Thought Content: Thought content normal.        Judgment: Judgment normal.       No results found for any visits on 05/18/21.  Assessment & Plan     Problem List Items Addressed This Visit       Other   Weight loss advised    Discussed increasing weight Discussed use of BMR to guide calorie restriction Discussed balanced meal times/portions and increased use of protein to assist with fullness      Attention deficit hyperactivity disorder (ADHD), predominantly inattentive type - Primary    Chronic  stable, continue medication Intermittent work hours Takes medication for long work days without breaks      BMI 38.0-38.9,adult    BMI 38.3 Trial of metformin to assist with diet and exercise plan        Return in about 3 months (around 08/17/2021) for weight and ADD concerns.     Vonna Kotyk, FNP, have reviewed all documentation for this visit. The documentation on 05/18/21 for the exam, diagnosis, procedures, and orders are all accurate and complete.    [name], [place] [phone] (phone) [phone] (fax)  [place]

## 2021-05-18 NOTE — Assessment & Plan Note (Signed)
Discussed increasing weight Discussed use of BMR to guide calorie restriction Discussed balanced meal times/portions and increased use of protein to assist with fullness

## 2021-05-18 NOTE — Assessment & Plan Note (Signed)
BMI 38.3 Trial of metformin to assist with diet and exercise plan

## 2021-05-28 ENCOUNTER — Ambulatory Visit: Payer: Self-pay | Admitting: *Deleted

## 2021-05-28 NOTE — Telephone Encounter (Signed)
See nurse triage note down below. KW

## 2021-05-28 NOTE — Telephone Encounter (Signed)
Reason for Disposition . MODERATE vaginal bleeding (e.g., soaking 1 pad or tampon per hour and present > 6 hours; 1 menstrual cup every 6 hours)  Answer Assessment - Initial Assessment Questions 1. AMOUNT: "Describe the bleeding that you are having."    - SPOTTING: spotting, or pinkish / brownish mucous discharge; does not fill panty liner or pad    - MILD:  less than 1 pad / hour; less than patient's usual menstrual bleeding   - MODERATE: 1-2 pads / hour; 1 menstrual cup every 6 hours; small-medium blood clots (e.g., pea, grape, small coin)   - SEVERE: soaking 2 or more pads/hour for 2 or more hours; 1 menstrual cup every 2 hours; bleeding not contained by pads or continuous red blood from vagina; large blood clots (e.g., golf ball, large coin)      Moderate/severe 2. ONSET: "When did the bleeding begin?" "Is it continuing now?"    7-8 days 3. MENSTRUAL PERIOD: "When was the last normal menstrual period?" "How is this different than your period?"     Has been heavy over the past year 4. REGULARITY: "How regular are your periods?"     Irregular- but monthly 5. ABDOMINAL PAIN: "Do you have any pain?" "How bad is the pain?"  (e.g., Scale 1-10; mild, moderate, or severe)   - MILD (1-3): doesn't interfere with normal activities, abdomen soft and not tender to touch    - MODERATE (4-7): interferes with normal activities or awakens from sleep, abdomen tender to touch    - SEVERE (8-10): excruciating pain, doubled over, unable to do any normal activities      Mild not regular 6. PREGNANCY: "Could you be pregnant?" "Are you sexually active?" "Did you recently give birth?"     BTL- years ago 69. BREASTFEEDING: "Are you breastfeeding?"     no 8. HORMONES: "Are you taking any hormone medications, prescription or OTC?" (e.g., birth control pills, estrogen)     no 9. BLOOD THINNERS: "Do you take any blood thinners?" (e.g., Coumadin/warfarin, Pradaxa/dabigatran, aspirin)     no 10. CAUSE: "What do you  think is causing the bleeding?" (e.g., recent gyn surgery, recent gyn procedure; known bleeding disorder, cervical cancer, polycystic ovarian disease, fibroids)         no 11. HEMODYNAMIC STATUS: "Are you weak or feeling lightheaded?" If Yes, ask: "Can you stand and walk normally?"        Fatigued- no energy 12. OTHER SYMPTOMS: "What other symptoms are you having with the bleeding?" (e.g., passed tissue, vaginal discharge, fever, menstrual-type cramps)       Large clots  Protocols used: Vaginal Bleeding - Abnormal-A-AH

## 2021-05-28 NOTE — Telephone Encounter (Signed)
Patient's mother is calling to discuss patient symptoms- heavy menses. Call could not be connected with agent. Attempted to call patient and mother back- mother number is work number- no long valid. Patient number- left message to call office.

## 2021-05-28 NOTE — Telephone Encounter (Signed)
  Chief Complaint: Mother calling for patient- heavy cycle Symptoms: heavy cycle, soaking tampon-large clots, fatigue Frequency: 3 days Pertinent Negatives: Disposition: [] ED /[x] Urgent Care (no appt availability in office) / [] Appointment(In office/virtual)/ []  [facility]/ [] Home Care/ [] Refused Recommended Disposition  Additional Notes: irregular cycle history, BTL, Hx heavy cycles

## 2021-05-31 ENCOUNTER — Other Ambulatory Visit: Payer: Self-pay | Admitting: Family Medicine

## 2021-05-31 DIAGNOSIS — N921 Excessive and frequent menstruation with irregular cycle: Secondary | ICD-10-CM

## 2021-06-01 ENCOUNTER — Encounter: Payer: Self-pay | Admitting: Family Medicine

## 2021-06-01 ENCOUNTER — Telehealth: Payer: Self-pay | Admitting: Family Medicine

## 2021-06-01 ENCOUNTER — Other Ambulatory Visit: Payer: Self-pay

## 2021-06-01 ENCOUNTER — Ambulatory Visit
Admission: RE | Admit: 2021-06-01 | Discharge: 2021-06-01 | Disposition: A | Discharge status: Home / Self Care | Payer: 59 | Source: Ambulatory Visit | Attending: Family Medicine | Admitting: Family Medicine

## 2021-06-01 ENCOUNTER — Ambulatory Visit (INDEPENDENT_AMBULATORY_CARE_PROVIDER_SITE_OTHER): Payer: 59 | Admitting: Family Medicine

## 2021-06-01 VITALS — BP 110/82 | HR 89 | Resp 16 | Wt 212.3 lb

## 2021-06-01 DIAGNOSIS — R42 Dizziness and giddiness: Secondary | ICD-10-CM | POA: Diagnosis present

## 2021-06-01 DIAGNOSIS — R0602 Shortness of breath: Secondary | ICD-10-CM | POA: Diagnosis not present

## 2021-06-01 DIAGNOSIS — N921 Excessive and frequent menstruation with irregular cycle: Secondary | ICD-10-CM | POA: Diagnosis present

## 2021-06-01 DIAGNOSIS — Z6837 Body mass index (BMI) 37.0-37.9, adult: Secondary | ICD-10-CM | POA: Insufficient documentation

## 2021-06-01 NOTE — Assessment & Plan Note (Signed)
Many possible causes; check CBC- lungs clear, denies sick contacts Continue to monitor

## 2021-06-01 NOTE — Telephone Encounter (Signed)
Patient called to discuss findings from results from pelvic US; patient took findings well and was appreciative of speed of appointment and imaging. Advised that I would reach out to GYN to see if patient could move up her appt given results of findings.

## 2021-06-01 NOTE — Progress Notes (Signed)
Established patient visit   Patient: Wendy [name]   DOB: [birth year]   45 y.o. Female  MRN: [id number] Visit Date: 06/01/2021  Today's healthcare provider: [name], FNP   Chief Complaint  Patient presents with   Vaginal Bleeding   Subjective    Vaginal Bleeding The patient's primary symptoms include vaginal bleeding. The patient's pertinent negatives include no genital itching, genital lesions, genital odor, genital rash, missed menses, pelvic pain or vaginal discharge. This is a new problem. The current episode started 1 to 4 weeks ago. The problem occurs constantly. The problem has been gradually worsening. The patient is experiencing no pain. Pertinent negatives include no abdominal pain, anorexia, back pain, chills, constipation, diarrhea, discolored urine, flank pain, frequency, headaches, hematuria, joint pain, painful intercourse, rash, sore throat, urgency or vomiting. The vaginal bleeding is heavier than menses. She has been passing clots. She has not been passing tissue. She has tried nothing for the symptoms.   Patient reports that she took at home pregnancy test and states results were negative.   Medications: Outpatient Medications Prior to Visit  Medication Sig   albuterol (VENTOLIN HFA) 108 (90 Base) MCG/ACT inhaler INHALE 2 PUFFS INTO THE LUNGS EVERY 4-6 HOURS AS NEEDED   ALPRAZolam (XANAX) 0.5 MG tablet TAKE 1/2 TO 1 TABLET TWICE DAILY AS NEEDED FOR ANXIETY   amphetamine-dextroamphetamine (ADDERALL XR) 15 MG 24 hr capsule Take 1 capsule by mouth every morning.   BREO ELLIPTA 100-25 MCG/INH AEPB INHALE 1 PUFF INTO THE LUNGS DAILY   Cholecalciferol (VITAMIN D PO) Take by mouth.   MAGNESIUM CARBONATE PO Take by mouth.   metFORMIN (GLUCOPHAGE XR) 750 MG 24 hr tablet Take 1 tablet (750 mg total) by mouth daily with breakfast.   Multiple Vitamin (MULTI VITAMIN DAILY PO) Take by mouth.   tretinoin (RETIN-A) 0.1 % cream Apply topically at bedtime.   vortioxetine  HBr (TRINTELLIX) 20 MG TABS tablet Take 1 tablet (20 mg total) by mouth daily.   No facility-administered medications prior to visit.    Review of Systems  Constitutional:  Positive for fatigue. Negative for chills.  HENT:  Negative for sore throat.   Gastrointestinal:  Negative for abdominal pain, anorexia, constipation, diarrhea and vomiting.  Genitourinary:  Positive for vaginal bleeding. Negative for flank pain, frequency, hematuria, missed menses, pelvic pain, urgency and vaginal discharge.  Musculoskeletal:  Negative for back pain and joint pain.  Skin:  Negative for rash.  Neurological:  Positive for dizziness and light-headedness. Negative for headaches.      Objective    BP 110/82   Pulse 89   Resp 16   Wt 212 lb 4.8 oz (96.3 kg)   LMP  (Within Months)   SpO2 100%   BMI 37.61 kg/m    Physical Exam Vitals and nursing note reviewed.  Constitutional:      General: She is not in acute distress.    Appearance: Normal appearance. She is obese. She is not ill-appearing, toxic-appearing or diaphoretic.  HENT:     Head: Normocephalic and atraumatic.  Cardiovascular:     Rate and Rhythm: Normal rate and regular rhythm.     Pulses: Normal pulses.     Heart sounds: Normal heart sounds. No murmur heard.   No friction rub. No gallop.  Pulmonary:     Effort: Pulmonary effort is normal. No respiratory distress.     Breath sounds: Normal breath sounds. No stridor. No wheezing, rhonchi or rales.  Chest:  Chest wall: No tenderness.  Abdominal:     General: Bowel sounds are normal.     Palpations: Abdomen is soft.  Genitourinary:    General: Normal vulva.     Exam position: Lithotomy position.     Tanner stage (genital): 5.     Vagina: Bleeding present.     Adnexa: Left adnexa normal.       Right: Tenderness present.        Left: No tenderness.       Comments: Bright red blood seen throughout vaginal cavity  Musculoskeletal:        General: No swelling, tenderness,  deformity or signs of injury. Normal range of motion.     Right lower leg: No edema.     Left lower leg: No edema.  Skin:    General: Skin is warm and dry.     Capillary Refill: Capillary refill takes less than 2 seconds.     Coloration: Skin is not jaundiced or pale.     Findings: No bruising, erythema, lesion or rash.  Neurological:     General: No focal deficit present.     Mental Status: She is alert and oriented to person, place, and time. Mental status is at baseline.     Cranial Nerves: No cranial nerve deficit.     Sensory: No sensory deficit.     Motor: No weakness.     Coordination: Coordination normal.  Psychiatric:        Mood and Affect: Mood normal.        Behavior: Behavior normal.        Thought Content: Thought content normal.        Judgment: Judgment normal.      No results found for any visits on 06/01/21.  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      Vonna Kotyk, FNP, have reviewed  all documentation for this visit. The documentation on 06/01/21 for the exam, diagnosis, procedures, and orders are all accurate and complete.    [name], [place] [phone] (phone) [phone] (fax)  [place]

## 2021-06-01 NOTE — Assessment & Plan Note (Signed)
Report x1 over w/e due to potential excess blood loss d/t irregularity and excess bleeding

## 2021-06-01 NOTE — Assessment & Plan Note (Signed)
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

## 2021-06-02 LAB — CBC
Hematocrit: 37.3 % (ref 34.0–46.6)
Hemoglobin: 12.2 g/dL (ref 11.1–15.9)
MCH: 29.8 pg (ref 26.6–33.0)
MCHC: 32.7 g/dL (ref 31.5–35.7)
MCV: 91 fL (ref 79–97)
Platelets: 221 10*3/uL (ref 150–450)
RBC: 4.1 x10E6/uL (ref 3.77–5.28)
RDW: 12.4 % (ref 11.7–15.4)
WBC: 5.8 10*3/uL (ref 3.4–10.8)

## 2021-06-03 ENCOUNTER — Other Ambulatory Visit: Payer: Self-pay | Admitting: Physician Assistant

## 2021-06-03 DIAGNOSIS — F411 Generalized anxiety disorder: Secondary | ICD-10-CM

## 2021-06-03 DIAGNOSIS — F3341 Major depressive disorder, recurrent, in partial remission: Secondary | ICD-10-CM

## 2021-06-07 ENCOUNTER — Encounter: Payer: Self-pay | Admitting: Physician Assistant

## 2021-07-08 DIAGNOSIS — N840 Polyp of corpus uteri: Secondary | ICD-10-CM | POA: Diagnosis not present

## 2021-07-08 DIAGNOSIS — N939 Abnormal uterine and vaginal bleeding, unspecified: Secondary | ICD-10-CM | POA: Diagnosis not present

## 2021-07-08 DIAGNOSIS — N83201 Unspecified ovarian cyst, right side: Secondary | ICD-10-CM | POA: Diagnosis not present

## 2021-07-15 ENCOUNTER — Other Ambulatory Visit: Payer: Self-pay | Admitting: Obstetrics and Gynecology

## 2021-07-21 ENCOUNTER — Inpatient Hospital Stay: Admission: RE | Admit: 2021-07-21 | Payer: Self-pay | Source: Ambulatory Visit

## 2021-07-21 NOTE — H&P (Signed)
Wendy [name] is a 46 y.o. female here for Fractional D+C , H/S, myosure resection of endometrial polyp and Novasure endometrial ablation   irregular bleeding . She states that her PCP did a u/s 12/22 that possible showed a 1.9x1.8 cm submucosal fibroid .  No significant pain  G2p2 svdx1 , c/s x 1 s/p BTL . Not sexually active       Past Medical History:  has no past medical history on file.  Past Surgical History:  has a past surgical history that includes Cesarean section and Tubal ligation. Family History: family history is not on file. Social History:  reports that she has never smoked. She has never used smokeless tobacco. She reports that she does not currently use alcohol. She reports that she does not currently use drugs. OB/GYN History:          OB History     Gravida  2   Para  2   Term      Preterm      AB      Living  2      SAB      IAB      Ectopic      Molar      Multiple      Live Births  2             Allergies: is allergic to peanut, banana, egg, and hydrocodone-acetaminophen. Medications:   Current Outpatient Medications:    ADDERALL XR 15 mg XR capsule, Take 1 capsule (15 mg total) by mouth every morning, Disp: , Rfl:    albuterol 90 mcg/actuation inhaler, , Disp: , Rfl:    ALPRAZolam (XANAX) 0.5 MG tablet, , Disp: , Rfl:    BREO ELLIPTA 100-25 mcg/dose DsDv inhaler, , Disp: , Rfl:    metFORMIN (GLUCOPHAGE-XR) 750 MG XR tablet, , Disp: , Rfl:    Review of Systems: General:                      No fatigue or weight loss Eyes:                           No vision changes Ears:                            No hearing difficulty Respiratory:                No cough or shortness of breath Pulmonary:                  No asthma or shortness of breath Cardiovascular:           No chest pain, palpitations, dyspnea on exertion Gastrointestinal:          No abdominal bloating, chronic diarrhea, constipations, masses, pain or hematochezia Genitourinary:              No hematuria, dysuria, abnormal vaginal discharge, pelvic pain, Menometrorrhagia Lymphatic:                   No swollen lymph nodes Musculoskeletal:         No muscle weakness Neurologic:                  No extremity weakness, syncope, seizure disorder Psychiatric:  No history of depression, delusions or suicidal/homicidal ideation      Exam:       Vitals:    07/08/21 0841  BP: 113/75  Pulse: 88      Body mass index is 38.44 kg/m.   WDWN white/ female in NAD   Lungs: CTA  CV : RRR without murmur     Neck:  no thyromegaly Abdomen: soft , no mass, normal active bowel sounds,  non-tender, no rebound tenderness Pelvic: tanner stage 5 ,  External genitalia: vulva /labia no lesions Urethra: no prolapse Vagina: normal physiologic d/c Cervix: no lesions, no cervical motion tenderness   Uterus: normal size shape and contour, non-tender Adnexa: no mass,  non-tender   Rectovaginal: Saline infusion sonohysterography: betadine prep to the cervix followed by placement of the HSG catheter into the endometrial canal . Sterile H2O is injected while performing a transvaginal u/s . Findings: Saline  [place]   Endometrial mass seen= 2.27 x 1.98 x 1.41 cm   Ut wnl          Endometrium=22.66 mm probable polyp seen= 1.75 x 1.62 x 1.46 cm   Lt ov  wnl   Rt septated  Ov cyst= 3.03 cm     Impression:    The primary encounter diagnosis was Abnormal uterine bleeding (AUB). Diagnoses of Endometrium, polyp and Right ovarian cyst were also pertinent to this visit.   Ovarian cyst is benign appearing    Plan:  Explained the findings on the u/s  Offered a Fractional d+c , myosure resection and Novasure resection . The procedure was explained in full detail . Limitation of the novasure with the array discussed .     Risks dicussed . See [place] notes      Caroline Sauger, MD

## 2021-07-22 ENCOUNTER — Other Ambulatory Visit: Payer: Self-pay | Admitting: Family Medicine

## 2021-07-22 DIAGNOSIS — F900 Attention-deficit hyperactivity disorder, predominantly inattentive type: Secondary | ICD-10-CM

## 2021-07-22 DIAGNOSIS — J4520 Mild intermittent asthma, uncomplicated: Secondary | ICD-10-CM

## 2021-07-22 NOTE — Telephone Encounter (Signed)
Requested medication (s) are due for refill today: Yes  Requested medication (s) are on the active medication list: Yes  Last refill:  04/30/21  Future visit scheduled: Yes  Notes to clinic:  See request.    Requested Prescriptions  Pending Prescriptions Disp Refills   amphetamine-dextroamphetamine (ADDERALL XR) 15 MG 24 hr capsule [Pharmacy Med Name: AMPHETAMINE-DEXTROAMPHET ER 15 MG C] 30 capsule     Sig: TAKE 1 CAPSULE BY MOUTH EVERY MORNING     Not Delegated - Psychiatry:  Stimulants/ADHD Failed - 07/22/2021  1:21 PM      Failed - This refill cannot be delegated      Failed - Urine Drug Screen completed in last 360 days      Passed - Last BP in normal range    BP Readings from Last 1 Encounters:  06/01/21 110/82          Passed - Last Heart Rate in normal range    Pulse Readings from Last 1 Encounters:  06/01/21 89          Passed - Valid encounter within last 6 months    Recent Outpatient Visits           1 month ago Menorrhagia with irregular cycle   [facility] Tally Joe T, FNP   2 months ago Attention deficit hyperactivity disorder (ADHD), predominantly inattentive type   [facility] [name], FNP   9 months ago Acute vaginitis   [facility], Kelby Aline, FNP   11 months ago Exposure to [employer], Kelby Aline, [employer]   1 year ago History of [place] Bacigal[place], Dionne Bucy, MD       Future Appointments             In 1 week [name], [place], [facility]            Signed Prescriptions Disp Refills   albuterol (VENTOLIN HFA) 108 (90 Base) MCG/ACT inhaler 8.5 g 2    Sig: INHALE 2 PUFFS INTO THE LUNGS EVERY 4-6 HOURS AS NEEDED     Pulmonology:  Beta Agonists 2 Passed - 07/22/2021  1:21 PM      Passed - Last BP in normal range    BP Readings from Last 1 Encounters:  06/01/21 110/82          Passed - Last  Heart Rate in normal range    Pulse Readings from Last 1 Encounters:  06/01/21 89          Passed - Valid encounter within last 12 months    Recent Outpatient Visits           1 month ago Menorrhagia with irregular cycle   [facility] Tally Joe T, FNP   2 months ago Attention deficit hyperactivity disorder (ADHD), predominantly inattentive type   [facility] [name], FNP   9 months ago Acute vaginitis   [facility] [place], Kelby Aline, FNP   11 months ago Exposure to [employer], Kelby Aline, FNP   1 year ago History of [place] Bacigal[place], Dionne Bucy, MD       Future Appointments             In 1 week [name], [place], [place]

## 2021-07-23 ENCOUNTER — Encounter
Admission: RE | Admit: 2021-07-23 | Discharge: 2021-07-23 | Disposition: A | Discharge status: Home / Self Care | Payer: 59 | Source: Ambulatory Visit | Attending: Obstetrics and Gynecology | Admitting: Obstetrics and Gynecology

## 2021-07-23 ENCOUNTER — Other Ambulatory Visit: Payer: Self-pay

## 2021-07-23 HISTORY — DX: Chlamydial infection, unspecified: A74.9

## 2021-07-23 HISTORY — DX: Gastro-esophageal reflux disease without esophagitis: K21.9

## 2021-07-23 HISTORY — DX: Attention-deficit hyperactivity disorder, unspecified type: F90.9

## 2021-07-23 HISTORY — DX: Unspecified asthma, uncomplicated: J45.909

## 2021-07-23 HISTORY — DX: Headache, unspecified: R51.9

## 2021-07-23 NOTE — Patient Instructions (Signed)
Your procedure is scheduled on:07-29-21 Thursday Report to the Registration Desk on the 1st floor of the [facility].Then proceed to the 2nd floor Surgery Desk in the [facility] To find out your arrival time, please call [phone] between 1PM - 3PM on:07-28-21 Wednesday  REMEMBER: Instructions that are not followed completely may result in serious medical risk, up to and including death; or upon the discretion of your surgeon and anesthesiologist your surgery may need to be rescheduled.  Do not eat food after midnight the night before surgery.  No gum chewing, lozengers or hard candies.  You may however, drink CLEAR liquids up to 2 hours before you are scheduled to arrive for your surgery. Do not drink anything within 2 hours of your scheduled arrival time.  Clear liquids include: - water  - apple juice without pulp - gatorade (not RED, PURPLE, OR BLUE) - black coffee or tea (Do NOT add milk or creamers to the coffee or tea) Do NOT drink anything that is not on this list.  TAKE THESE MEDICATIONS THE MORNING OF SURGERY WITH A SIP OF WATER: -You may take your ALPRAZolam Duanne Moron) the day of surgery if needed for anxiety  Use your BREO ELLIPTA Inhaler and your albuterol (VENTOLIN HFA) Inhaler the day of surgery and bring your Albuterol Inhaler to the hospital  One week prior to surgery: Stop Anti-inflammatories (NSAIDS) such as Advil, Aleve, Ibuprofen, Motrin, Naproxen, Naprosyn and Aspirin based products such as Excedrin, Goodys Powder, BC Powder.You may however, continue to take Tylenol if needed for pain up until the day of surgery.  Stop ANY OVER THE COUNTER supplements/vitamins NOW (07-23-21) until after surgery -VITAMIN D, COLLAGEN and MAGNESIUM CARBONATE   No Alcohol for 24 hours before or after surgery.  No Smoking including e-cigarettes for 24 hours prior to surgery.  No chewable tobacco products for at least 6 hours prior to surgery.  No nicotine patches on the day of  surgery.  Do not use any "recreational" drugs for at least a week prior to your surgery.  Please be advised that the combination of cocaine and anesthesia may have negative outcomes, up to and including death. If you test positive for cocaine, your surgery will be cancelled.  On the morning of surgery brush your teeth with toothpaste and water, you may rinse your mouth with mouthwash if you wish. Do not swallow any toothpaste or mouthwash.  Do not wear jewelry, make-up, hairpins, clips or nail polish.  Do not wear lotions, powders, or perfumes.   Do not shave body from the neck down 48 hours prior to surgery just in case you cut yourself which could leave a site for infection.  Also, freshly shaved skin may become irritated if using the CHG soap.  Contact lenses, hearing aids and dentures may not be worn into surgery.  Do not bring valuables to the hospital. [facility] is not responsible for any missing/lost belongings or valuables.   Notify your doctor if there is any change in your medical condition (cold, fever, infection).  Wear comfortable clothing (specific to your surgery type) to the hospital.  After surgery, you can help prevent lung complications by doing breathing exercises.  Take deep breaths and cough every 1-2 hours. Your doctor may order a device called an Incentive Spirometer to help you take deep breaths. When coughing or sneezing, hold a pillow firmly against your incision with both hands. This is called splinting. Doing this helps protect your incision. It also decreases belly discomfort.  If you are being admitted to the hospital overnight, leave your suitcase in the car. After surgery it may be brought to your room.  If you are being discharged the day of surgery, you will not be allowed to drive home. You will need a responsible adult (18 years or older) to drive you home and stay with you that night.   If you are taking public transportation, you will need  to have a responsible adult (18 years or older) with you. Please confirm with your physician that it is acceptable to use public transportation.   Please call the [facility] at [phone] if you have any questions about these instructions.  Surgery Visitation Policy:  Patients undergoing a surgery or procedure may have one family member or support person with them as long as that person is not COVID-19 positive or experiencing its symptoms.  That person may remain in the waiting area during the procedure and may rotate out with other people.  Inpatient Visitation:    Visiting hours are 7 a.m. to 8 p.m. Up to two visitors ages 16+ are allowed at one time in a patient room. The visitors may rotate out with other people during the day. Visitors must check out when they leave, or other visitors will not be allowed. One designated support person may remain overnight. The visitor must pass COVID-19 screenings, use hand sanitizer when entering and exiting the patients room and wear a mask at all times, including in the patients room. Patients must also wear a mask when staff or their visitor are in the room. Masking is required regardless of vaccination status.

## 2021-07-26 ENCOUNTER — Encounter
Admission: RE | Admit: 2021-07-26 | Discharge: 2021-07-26 | Disposition: A | Discharge status: Home / Self Care | Payer: 59 | Source: Ambulatory Visit | Attending: Obstetrics and Gynecology | Admitting: Obstetrics and Gynecology

## 2021-07-26 ENCOUNTER — Other Ambulatory Visit: Payer: Self-pay

## 2021-07-26 DIAGNOSIS — Z01812 Encounter for preprocedural laboratory examination: Secondary | ICD-10-CM | POA: Insufficient documentation

## 2021-07-26 DIAGNOSIS — Z01818 Encounter for other preprocedural examination: Secondary | ICD-10-CM

## 2021-07-26 LAB — TYPE AND SCREEN
ABO/RH(D): O POS
Antibody Screen: NEGATIVE

## 2021-07-26 LAB — BASIC METABOLIC PANEL
Anion gap: 5 (ref 5–15)
BUN: 11 mg/dL (ref 6–20)
CO2: 24 mmol/L (ref 22–32)
Calcium: 8.9 mg/dL (ref 8.9–10.3)
Chloride: 106 mmol/L (ref 98–111)
Creatinine, Ser: 0.69 mg/dL (ref 0.44–1.00)
GFR, Estimated: >60 mL/min (ref >60)
Glucose, Bld: 86 mg/dL (ref 70–99)
Potassium: 3.6 mmol/L (ref 3.5–5.1)
Sodium: 135 mmol/L (ref 135–145)

## 2021-07-26 LAB — CBC
HCT: 37.8 % (ref 36.0–46.0)
Hemoglobin: 12.7 g/dL (ref 12.0–15.0)
MCH: 30 pg (ref 26.0–34.0)
MCHC: 33.6 g/dL (ref 30.0–36.0)
MCV: 89.2 fL (ref 80.0–100.0)
Platelets: 254 10*3/uL (ref 150–400)
RBC: 4.24 MIL/uL (ref 3.87–5.11)
RDW: 12.8 % (ref 11.5–15.5)
WBC: 7.1 10*3/uL (ref 4.0–10.5)
nRBC: 0 % (ref 0.0–0.2)

## 2021-07-29 ENCOUNTER — Ambulatory Visit
Admission: RE | Admit: 2021-07-29 | Discharge: 2021-07-29 | Disposition: A | Discharge status: Home / Self Care | Payer: 59 | Attending: Obstetrics and Gynecology | Admitting: Obstetrics and Gynecology

## 2021-07-29 ENCOUNTER — Encounter: Payer: Self-pay | Admitting: Obstetrics and Gynecology

## 2021-07-29 ENCOUNTER — Other Ambulatory Visit: Payer: Self-pay

## 2021-07-29 ENCOUNTER — Encounter
Admission: RE | Disposition: A | Discharge status: Home / Self Care | Payer: Self-pay | Source: Home / Self Care | Attending: Obstetrics and Gynecology

## 2021-07-29 ENCOUNTER — Ambulatory Visit: Payer: 59 | Admitting: Certified Registered"

## 2021-07-29 ENCOUNTER — Ambulatory Visit: Payer: 59 | Admitting: Urgent Care

## 2021-07-29 DIAGNOSIS — N840 Polyp of corpus uteri: Secondary | ICD-10-CM | POA: Insufficient documentation

## 2021-07-29 DIAGNOSIS — D250 Submucous leiomyoma of uterus: Secondary | ICD-10-CM | POA: Diagnosis not present

## 2021-07-29 DIAGNOSIS — N83201 Unspecified ovarian cyst, right side: Secondary | ICD-10-CM | POA: Insufficient documentation

## 2021-07-29 DIAGNOSIS — N939 Abnormal uterine and vaginal bleeding, unspecified: Secondary | ICD-10-CM | POA: Insufficient documentation

## 2021-07-29 DIAGNOSIS — E669 Obesity, unspecified: Secondary | ICD-10-CM | POA: Insufficient documentation

## 2021-07-29 DIAGNOSIS — Z6838 Body mass index (BMI) 38.0-38.9, adult: Secondary | ICD-10-CM | POA: Insufficient documentation

## 2021-07-29 DIAGNOSIS — J45909 Unspecified asthma, uncomplicated: Secondary | ICD-10-CM | POA: Diagnosis not present

## 2021-07-29 DIAGNOSIS — F418 Other specified anxiety disorders: Secondary | ICD-10-CM | POA: Diagnosis not present

## 2021-07-29 DIAGNOSIS — Z01818 Encounter for other preprocedural examination: Secondary | ICD-10-CM

## 2021-07-29 HISTORY — PX: DILITATION & CURRETTAGE/HYSTROSCOPY WITH NOVASURE ABLATION: SHX5568

## 2021-07-29 LAB — POCT PREGNANCY, URINE: Preg Test, Ur: NEGATIVE

## 2021-07-29 LAB — ABO/RH: ABO/RH(D): O POS

## 2021-07-29 SURGERY — DILATATION & CURETTAGE/HYSTEROSCOPY WITH NOVASURE ABLATION
Anesthesia: General

## 2021-07-29 MED ORDER — MIDAZOLAM HCL 5 MG/5ML IJ SOLN
INTRAMUSCULAR | Status: DC | PRN
  Administered 2021-07-29: 2 mg via INTRAVENOUS

## 2021-07-29 MED ORDER — FENTANYL CITRATE (PF) 100 MCG/2ML IJ SOLN
INTRAMUSCULAR | Status: AC
  Filled 2021-07-29: qty 2

## 2021-07-29 MED ORDER — PROPOFOL 10 MG/ML IV BOLUS
INTRAVENOUS | Status: AC
  Filled 2021-07-29: qty 60

## 2021-07-29 MED ORDER — FAMOTIDINE 20 MG PO TABS: 20.0000 mg | ORAL_TABLET | Freq: Once | ORAL | Status: AC

## 2021-07-29 MED ORDER — ONDANSETRON HCL 4 MG/2ML IJ SOLN
INTRAMUSCULAR | Status: DC | PRN
Start: 2021-07-29 — End: 2021-07-29
  Administered 2021-07-29 (×2): 4 mg via INTRAVENOUS

## 2021-07-29 MED ORDER — FAMOTIDINE 20 MG PO TABS
ORAL_TABLET | ORAL | Status: AC
  Administered 2021-07-29: 20 mg via ORAL
  Filled 2021-07-29: qty 1

## 2021-07-29 MED ORDER — POVIDONE-IODINE 10 % EX SWAB
2.0000 "application " | Freq: Once | CUTANEOUS | Status: AC
  Administered 2021-07-29: 2 via TOPICAL

## 2021-07-29 MED ORDER — SILVER NITRATE-POT NITRATE 75-25 % EX MISC
CUTANEOUS | Status: DC | PRN
  Administered 2021-07-29: 1

## 2021-07-29 MED ORDER — PROPOFOL 10 MG/ML IV BOLUS
INTRAVENOUS | Status: DC | PRN
  Administered 2021-07-29: 50 mg via INTRAVENOUS
  Administered 2021-07-29: 100 mg via INTRAVENOUS
  Administered 2021-07-29: 50 mg via INTRAVENOUS

## 2021-07-29 MED ORDER — CHLORHEXIDINE GLUCONATE 0.12 % MT SOLN: 15.0000 mL | Freq: Once | OROMUCOSAL | Status: AC

## 2021-07-29 MED ORDER — ORAL CARE MOUTH RINSE: 15.0000 mL | Freq: Once | OROMUCOSAL | Status: AC

## 2021-07-29 MED ORDER — ACETAMINOPHEN 500 MG PO TABS: 1000.0000 mg | ORAL_TABLET | ORAL | Status: AC

## 2021-07-29 MED ORDER — CEFAZOLIN SODIUM-DEXTROSE 2-4 GM/100ML-% IV SOLN
INTRAVENOUS | Status: AC
  Filled 2021-07-29: qty 100

## 2021-07-29 MED ORDER — LACTATED RINGERS IV SOLN: INTRAVENOUS | Status: DC

## 2021-07-29 MED ORDER — CEFAZOLIN SODIUM-DEXTROSE 2-4 GM/100ML-% IV SOLN
2.0000 g | Freq: Once | INTRAVENOUS | Status: AC
  Administered 2021-07-29: 2 g via INTRAVENOUS

## 2021-07-29 MED ORDER — DEXAMETHASONE SODIUM PHOSPHATE 10 MG/ML IJ SOLN
INTRAMUSCULAR | Status: DC | PRN
  Administered 2021-07-29: 10 mg via INTRAVENOUS

## 2021-07-29 MED ORDER — GLYCOPYRROLATE 0.2 MG/ML IJ SOLN
INTRAMUSCULAR | Status: DC | PRN
  Administered 2021-07-29: .2 mg via INTRAVENOUS

## 2021-07-29 MED ORDER — MIDAZOLAM HCL 2 MG/2ML IJ SOLN
INTRAMUSCULAR | Status: AC
  Filled 2021-07-29: qty 2

## 2021-07-29 MED ORDER — KETOROLAC TROMETHAMINE 30 MG/ML IJ SOLN
INTRAMUSCULAR | Status: DC | PRN
  Administered 2021-07-29: 30 mg via INTRAVENOUS

## 2021-07-29 MED ORDER — 0.9 % SODIUM CHLORIDE (POUR BTL) OPTIME
TOPICAL | Status: DC | PRN
  Administered 2021-07-29: 100 mL

## 2021-07-29 MED ORDER — SODIUM CHLORIDE 0.9 % IR SOLN
Status: DC | PRN
  Administered 2021-07-29: 1047 mL

## 2021-07-29 MED ORDER — DEXMEDETOMIDINE (PRECEDEX) IN NS 20 MCG/5ML (4 MCG/ML) IV SYRINGE
PREFILLED_SYRINGE | INTRAVENOUS | Status: DC | PRN
  Administered 2021-07-29: 8 ug via INTRAVENOUS
  Administered 2021-07-29: 12 ug via INTRAVENOUS

## 2021-07-29 MED ORDER — ACETAMINOPHEN 500 MG PO TABS
ORAL_TABLET | ORAL | Status: AC
  Administered 2021-07-29: 1000 mg via ORAL
  Filled 2021-07-29: qty 2

## 2021-07-29 MED ORDER — EPHEDRINE SULFATE (PRESSORS) 50 MG/ML IJ SOLN
INTRAMUSCULAR | Status: DC | PRN
  Administered 2021-07-29: 5 mg via INTRAVENOUS

## 2021-07-29 MED ORDER — PROPOFOL 500 MG/50ML IV EMUL
INTRAVENOUS | Status: DC | PRN
  Administered 2021-07-29: 120 ug/kg/min via INTRAVENOUS

## 2021-07-29 MED ORDER — ONDANSETRON 4 MG PO TBDP: 4.0000 mg | ORAL_TABLET | Freq: Four times a day (QID) | ORAL | Status: DC | PRN

## 2021-07-29 MED ORDER — GABAPENTIN 300 MG PO CAPS: 300.0000 mg | ORAL_CAPSULE | ORAL | Status: AC

## 2021-07-29 MED ORDER — LIDOCAINE HCL (PF) 2 % IJ SOLN
INTRAMUSCULAR | Status: DC | PRN
  Administered 2021-07-29: 50 mg

## 2021-07-29 MED ORDER — GABAPENTIN 300 MG PO CAPS
ORAL_CAPSULE | ORAL | Status: AC
  Administered 2021-07-29: 300 mg via ORAL
  Filled 2021-07-29: qty 1

## 2021-07-29 MED ORDER — PROPOFOL 10 MG/ML IV BOLUS
INTRAVENOUS | Status: AC
  Filled 2021-07-29: qty 20

## 2021-07-29 MED ORDER — FENTANYL CITRATE (PF) 100 MCG/2ML IJ SOLN
INTRAMUSCULAR | Status: DC | PRN
  Administered 2021-07-29 (×2): 25 ug via INTRAVENOUS
  Administered 2021-07-29: 50 ug via INTRAVENOUS

## 2021-07-29 MED ORDER — CHLORHEXIDINE GLUCONATE 0.12 % MT SOLN
OROMUCOSAL | Status: AC
  Administered 2021-07-29: 15 mL via OROMUCOSAL
  Filled 2021-07-29: qty 15

## 2021-07-29 SURGICAL SUPPLY — 26 items
ABLATOR SURESOUND NOVASURE (ABLATOR) ×1 IMPLANT
BACTOSHIELD CHG 4% 4OZ (MISCELLANEOUS) ×1
BAG INFUSER PRESSURE 100CC (MISCELLANEOUS) ×2 IMPLANT
DEVICE MYOSURE REACH (MISCELLANEOUS) ×1 IMPLANT
DRSG TELFA 3X8 NADH (GAUZE/BANDAGES/DRESSINGS) ×2 IMPLANT
GAUZE 4X4 16PLY ~~LOC~~+RFID DBL (SPONGE) ×2 IMPLANT
GLOVE SURG SYN 8.0 (GLOVE) ×2 IMPLANT
GLOVE SURG SYN 8.0 PF PI (GLOVE) ×1 IMPLANT
GOWN STRL REUS W/ TWL LRG LVL3 (GOWN DISPOSABLE) ×1 IMPLANT
GOWN STRL REUS W/ TWL XL LVL3 (GOWN DISPOSABLE) ×1 IMPLANT
GOWN STRL REUS W/TWL LRG LVL3 (GOWN DISPOSABLE) ×2
GOWN STRL REUS W/TWL XL LVL3 (GOWN DISPOSABLE) ×2
IV NS 1000ML (IV SOLUTION) ×2
IV NS 1000ML BAXH (IV SOLUTION) ×1 IMPLANT
IV NS IRRIG 3000ML ARTHROMATIC (IV SOLUTION) ×2 IMPLANT
KIT PROCEDURE FLUENT (KITS) ×1 IMPLANT
KIT TURNOVER CYSTO (KITS) ×2 IMPLANT
MANIFOLD NEPTUNE II (INSTRUMENTS) ×2 IMPLANT
PACK DNC HYST (MISCELLANEOUS) ×1 IMPLANT
PAD DRESSING TELFA 3X8 NADH (GAUZE/BANDAGES/DRESSINGS) IMPLANT
PAD OB MATERNITY 4.3X12.25 (PERSONAL CARE ITEMS) ×2 IMPLANT
PAD PREP 24X41 OB/GYN DISP (PERSONAL CARE ITEMS) ×2 IMPLANT
SCRUB CHG 4% DYNA-HEX 4OZ (MISCELLANEOUS) ×1 IMPLANT
SEAL ROD LENS SCOPE MYOSURE (ABLATOR) ×2 IMPLANT
TOWEL OR 17X26 4PK STRL BLUE (TOWEL DISPOSABLE) ×2 IMPLANT
WATER STERILE IRR 500ML POUR (IV SOLUTION) ×2 IMPLANT

## 2021-07-29 NOTE — Brief Op Note (Signed)
07/29/2021  1:00 PM  PATIENT:  Wendy [name]  46 y.o. adult  PRE-OPERATIVE DIAGNOSIS:  abnormal uterine bleeding, endometrial polyp  POST-OPERATIVE DIAGNOSIS:  abnormal uterine bleeding, submucosal fibroid   PROCEDURE:  fractional d+c , Myosure resection of submucosal fibroid  Novasure ablation  SURGEON:  Surgeon(s) and Role:    * Brianna Bennett, Gwen Her, MD - Primary  PHYSICIAN ASSISTANT: CSt  ASSISTANTS: none   ANESTHESIA:   MAC and    EBL:  15 mL IOF 900  ou 100  NS deficit 60 cc  BLOOD ADMINISTERED:none  DRAINS: none   LOCAL MEDICATIONS USED:  NONE  SPECIMEN:  Source of Specimen:  ECC , endometrial currettings , resction of submucosal fibroid   DISPOSITION OF SPECIMEN:  PATHOLOGY  COUNTS:  YES  TOURNIQUET:  * No tourniquets in log *  DICTATION: .Other Dictation: Dictation Number verbal  PLAN OF CARE: Discharge to home after PACU  PATIENT DISPOSITION:  PACU - hemodynamically stable.   Delay start of Pharmacological VTE agent (>24hrs) due to surgical blood loss or risk of bleeding: not applicable

## 2021-07-29 NOTE — Transfer of Care (Signed)
Immediate Anesthesia Transfer of Care Note  Patient: Wendy [name]  Procedure(s) Performed: FRACTIONAL DILATATION & CURETTAGE/HYSTEROSCOPY WITH NOVASURE ABLATION/MYOSURE RESECTION OF POLYP  Patient Location: PACU  Anesthesia Type:General  Level of Consciousness: awake, drowsy and patient cooperative  Airway & Oxygen Therapy: Patient Spontanous Breathing and Patient connected to face mask oxygen  Post-op Assessment: Report given to RN and Post -op Vital signs reviewed and stable  Post vital signs: Reviewed and stable  Last Vitals:  Vitals Value Taken Time  BP    Temp    Pulse 64 07/29/21 1311  Resp 14 07/29/21 1311  SpO2 100 % 07/29/21 1311  Vitals shown include unvalidated device data.  Last Pain:  Vitals:   07/29/21 1027  TempSrc: Temporal  PainSc: 0-No pain         Complications: No notable events documented.

## 2021-07-29 NOTE — Anesthesia Procedure Notes (Signed)
Procedure Name: LMA Insertion Date/Time: 07/29/2021 12:23 PM Performed by: Kelton Pillar, CRNA LMA: LMA inserted LMA Size: 4.0

## 2021-07-29 NOTE — Discharge Instructions (Signed)

## 2021-07-29 NOTE — Progress Notes (Signed)
Pt is ready for Fx D+C and h/s , myosure and Novasure  Labs reviewed . Neg HCG . All questions answered  Proceed

## 2021-07-29 NOTE — Anesthesia Preprocedure Evaluation (Addendum)
Anesthesia Evaluation  Patient identified by MRN, date of birth, ID band Patient awake    Reviewed: Allergy & Precautions, H&P , NPO status , Patient's Chart, lab work & pertinent test results  Airway Mallampati: II  TM Distance: >3 FB Neck ROM: full    Dental no notable dental hx. (+) Teeth Intact   Pulmonary asthma ,    Pulmonary exam normal        Cardiovascular Exercise Tolerance: Good Normal cardiovascular exam  MPS 7/22: FINDINGS:  Regional wall motion:reveals normal myocardial thickening and wall  motion.  The overall quality of the study is good.   Artifacts noted: no  Left ventricular cavity: normal.   7/22: INTERPRETATION  NORMAL LEFT VENTRICULAR SYSTOLIC FUNCTION  NORMAL RIGHT VENTRICULAR SYSTOLIC FUNCTION  NO VALVULAR STENOSIS  TRIVIAL MR, TR  EF 50-55%    Neuro/Psych  Headaches, PSYCHIATRIC DISORDERS Anxiety Depression    GI/Hepatic Neg liver ROS,   Endo/Other  negative endocrine ROS  Renal/GU negative Renal ROS  negative genitourinary   Musculoskeletal negative musculoskeletal ROS (+)   Abdominal (+) + obese,   Peds negative pediatric ROS (+)  Hematology negative hematology ROS (+)   Anesthesia Other Findings abnormal uterine bleeding, endometrial polyp  Reproductive/Obstetrics negative OB ROS                          Anesthesia Physical  Anesthesia Plan  ASA: II  Anesthesia Plan: General   Post-op Pain Management:    Induction: Intravenous  PONV Risk Score and Plan: Ondansetron, Midazolam and TIVA  Airway Management Planned: Nasal Cannula and Natural Airway  Additional Equipment:   Intra-op Plan:   Post-operative Plan:   Informed Consent: I have reviewed the patients History and Physical, chart, labs and discussed the procedure including the risks, benefits and alternatives for the proposed anesthesia with the patient or authorized representative  who has indicated his/her understanding and acceptance.     Dental advisory given  Plan Discussed with: CRNA and Surgeon  Anesthesia Plan Comments:       Anesthesia Quick Evaluation

## 2021-07-30 ENCOUNTER — Encounter: Payer: Self-pay | Admitting: Obstetrics and Gynecology

## 2021-07-30 LAB — SURGICAL PATHOLOGY

## 2021-07-30 NOTE — Anesthesia Postprocedure Evaluation (Signed)
Anesthesia Post Note  Patient: Wendy [name]  Procedure(s) Performed: FRACTIONAL DILATATION & CURETTAGE/HYSTEROSCOPY WITH NOVASURE ABLATION/MYOSURE RESECTION OF POLYP  Patient location during evaluation: PACU Anesthesia Type: General Level of consciousness: awake and alert Pain management: pain level controlled Vital Signs Assessment: post-procedure vital signs reviewed and stable Respiratory status: spontaneous breathing, nonlabored ventilation and respiratory function stable Cardiovascular status: blood pressure returned to baseline and stable Postop Assessment: no apparent nausea or vomiting Anesthetic complications: no   No notable events documented.   Last Vitals:  Vitals:   07/29/21 1425 07/29/21 1432  BP:  101/66  Pulse: 70 71  Resp: 18 14  Temp:  36.8 C  SpO2: 100% 100%    Last Pain:  Vitals:   07/30/21 0810  TempSrc:   PainSc: 0-No pain                 [place] Ouch

## 2021-07-30 NOTE — Op Note (Signed)
[name], Wendy [name] MEDICAL RECORD NO: [id number] ACCOUNT NO: [id number] DATE OF BIRTH: 1975-11-15 FACILITY: [facility] LOCATION: [facility] PHYSICIAN: Boykin Nearing, MD  Operative Report   DATE OF PROCEDURE: 07/29/2021  PREOPERATIVE DIAGNOSES: 1.  Abnormal uterine bleeding. 2.  Endometrial mass consistent with submucosal fibroid.  POSTOPERATIVE DIAGNOSES: 1.  Abnormal uterine bleeding. 2.  A 2 cm submucosal fibroid.  PROCEDURE:   1.  Fractional dilation and curettage. 2.  MyoSure resection of submucosal fibroid. 3.  NovaSure endometrial ablation.  SURGEON:  Boykin Nearing, MD  ANESTHESIA:  LMA.  INDICATIONS:  This is a 46 year old female with abnormal uterine bleeding with saline infusion sonohysterography that demonstrates what appears to be submucosal fibroid, 2 cm in diameter.  DESCRIPTION OF PROCEDURE:  After adequate general endotracheal anesthesia, the patient was placed in dorsal supine position with the legs in the Robinson stirrups.  The patient's abdomen, perineum and vagina were prepped and draped in normal sterile  fashion.  The patient did receive 2 grams of IV Ancef prior to commencement of the case for surgical prophylaxis.  Timeout was performed.  Weighted speculum was placed in the posterior vaginal vault and the anterior cervix grasped with single tooth  tenaculum.  Straight catheterization of the bladder yielded 100 mL clear urine.  Endocervical curettage was performed followed by dilation of the cervix to #19 Hanks dilator.  Uterine sound sounds to 10 cm.  The hysteroscope with normal saline as  distending medium was inserted into the uterus and a large submucosal fibroid anterior, approximately 2 cm in diameter was noted.  The MyoSure Reach was brought up and resection of the submucosal fibroid was performed.  Pictures were taken.  MyoSure was  then removed and endometrial curettings was performed.  NovaSure ablator was brought up to the operative field  and based on the uterine length of 10 cm, cervical length of 5 cm, the cavity length was estimated at 5 cm.  The ablator was placed into the  uterine cavity and the array was opened and the array cervical width 4.3 cm.  Cavity assessment test was performed and passed and based on the uterine length, the power was 118 watts and ablation took place for 59 seconds.  The ablator was removed and  hysteroscope was readvanced and showing normal charring effect of the endometrial cavity.  Good hemostasis was noted.  There was small oozing from the tenaculum site that was controlled with silver nitrate.  No complications.  ESTIMATED BLOOD LOSS:  10 mL.  INTRAOPERATIVE FLUIDS:  900 mL.  URINE OUTPUT:  100 mL.  NET DEFICIT DURING THE MYOSURE: 60 mL.   SHW D: 07/29/2021 1:16:37 pm T: 07/30/2021 12:43:00 am  JOB: [id number]/ [id number]

## 2021-08-03 ENCOUNTER — Ambulatory Visit: Payer: Self-pay | Admitting: Family Medicine

## 2021-08-30 ENCOUNTER — Other Ambulatory Visit: Payer: Self-pay | Admitting: Family Medicine

## 2021-08-30 DIAGNOSIS — F900 Attention-deficit hyperactivity disorder, predominantly inattentive type: Secondary | ICD-10-CM

## 2021-08-31 NOTE — Telephone Encounter (Signed)
Requested medication (s) are due for refill today: yes ? ?Requested medication (s) are on the active medication list: yes ? ?Last refill:  07/23/21 #30/0 ? ?Future visit scheduled: NO ? ?Notes to clinic:  Unable to refill per protocol, cannot delegate. ? ? ? ?  ?Requested Prescriptions  ?Pending Prescriptions Disp Refills  ? amphetamine-dextroamphetamine (ADDERALL XR) 15 MG 24 hr capsule [Pharmacy Med Name: AMPHETAMINE-DEXTROAMPHET ER 15 MG C] 30 capsule   ?  Sig: TAKE 1 CAPSULE BY MOUTH EVERY MORNING  ?  ? Not Delegated - Psychiatry:  Stimulants/ADHD Failed - 08/30/2021  3:22 PM  ?  ?  Failed - This refill cannot be delegated  ?  ?  Failed - Urine Drug Screen completed in last 360 days  ?  ?  Passed - Last BP in normal range  ?  BP Readings from Last 1 Encounters:  ?07/29/21 101/66  ?  ?  ?  ?  Passed - Last Heart Rate in normal range  ?  Pulse Readings from Last 1 Encounters:  ?07/29/21 71  ?  ?  ?  ?  Passed - Valid encounter within last 6 months  ?  Recent Outpatient Visits   ? ?      ? 3 months ago Menorrhagia with irregular cycle  ? [facility] Tally Joe T, FNP  ? 3 months ago Attention deficit hyperactivity disorder (ADHD), predominantly inattentive type  ? [facility] [name], FNP  ? 11 months ago Acute vaginitis  ? [facility] Flinchum, Kelby Aline, FNP  ? 1 year ago Exposure to STD  ? [facility] Flinchum, Kelby Aline, FNP  ? 1 year ago History of COVID-19  ? [facility] Bacigalupo, Dionne Bucy, MD  ? ?  ?  ? ?  ?  ?  ? ?

## 2021-09-28 DIAGNOSIS — E669 Obesity, unspecified: Secondary | ICD-10-CM | POA: Diagnosis not present

## 2021-09-30 DIAGNOSIS — E669 Obesity, unspecified: Secondary | ICD-10-CM | POA: Diagnosis not present

## 2021-10-05 ENCOUNTER — Other Ambulatory Visit: Payer: Self-pay | Admitting: Family Medicine

## 2021-10-05 DIAGNOSIS — F900 Attention-deficit hyperactivity disorder, predominantly inattentive type: Secondary | ICD-10-CM

## 2021-10-05 NOTE — Telephone Encounter (Signed)
Requested medications are due for refill today.  yes ? ?Requested medications are on the active medications list.  yes ? ?Last refill. 08/31/2021 #30 0 refills ? ?Future visit scheduled.   no ? ?Notes to clinic.  Medication refill is not delegated. ? ? ? ?Requested Prescriptions  ?Pending Prescriptions Disp Refills  ? amphetamine-dextroamphetamine (ADDERALL XR) 15 MG 24 hr capsule [Pharmacy Med Name: AMPHETAMINE-DEXTROAMPHET ER 15 MG C] 30 capsule   ?  Sig: TAKE 1 CAPSULE BY MOUTH EVERY MORNING  ?  ? Not Delegated - Psychiatry:  Stimulants/ADHD Failed - 10/05/2021 11:18 AM  ?  ?  Failed - This refill cannot be delegated  ?  ?  Failed - Urine Drug Screen completed in last 360 days  ?  ?  Passed - Last BP in normal range  ?  BP Readings from Last 1 Encounters:  ?07/29/21 101/66  ?  ?  ?  ?  Passed - Last Heart Rate in normal range  ?  Pulse Readings from Last 1 Encounters:  ?07/29/21 71  ?  ?  ?  ?  Passed - Valid encounter within last 6 months  ?  Recent Outpatient Visits   ? ?      ? 4 months ago Menorrhagia with irregular cycle  ? [facility] Tally Joe T, FNP  ? 4 months ago Attention deficit hyperactivity disorder (ADHD), predominantly inattentive type  ? [place], FNP  ? 1 year ago Acute vaginitis  ? [facility] Flinchum, Kelby Aline, FNP  ? 1 year ago Exposure to STD  ? [facility] Flinchum, Kelby Aline, FNP  ? 1 year ago History of COVID-19  ? [facility] Bacigalupo, Dionne Bucy, MD  ? ?  ?  ? ? ?  ?  ?  ?  ?

## 2021-10-27 DIAGNOSIS — Z6836 Body mass index (BMI) 36.0-36.9, adult: Secondary | ICD-10-CM | POA: Diagnosis not present

## 2021-10-27 DIAGNOSIS — E669 Obesity, unspecified: Secondary | ICD-10-CM | POA: Diagnosis not present

## 2021-11-02 ENCOUNTER — Other Ambulatory Visit: Payer: Self-pay | Admitting: Family Medicine

## 2021-11-02 DIAGNOSIS — F900 Attention-deficit hyperactivity disorder, predominantly inattentive type: Secondary | ICD-10-CM

## 2021-11-24 ENCOUNTER — Other Ambulatory Visit: Payer: Self-pay | Admitting: Family Medicine

## 2021-11-24 DIAGNOSIS — Z1231 Encounter for screening mammogram for malignant neoplasm of breast: Secondary | ICD-10-CM

## 2021-11-24 NOTE — Progress Notes (Deleted)
      Established patient visit   Patient: Wendy [name]   DOB: 1975-08-12   46 y.o. Adult  MRN: [id number] Visit Date: 11/29/2021  Today's healthcare provider: [name], FNP   No chief complaint on file.  Subjective    HPI  Follow up for medication  The patient was last seen for this 6 months ago. Changes made at last visit include start Alpra.  He reports {excellent/good/fair/poor:19665} compliance with treatment. He feels that condition is {improved/worse/unchanged:3041574}. He {is/is not:21021397} having side effects. ***  -----------------------------------------------------------------------------------------   Medications: Outpatient Medications Prior to Visit  Medication Sig   albuterol (VENTOLIN HFA) 108 (90 Base) MCG/ACT inhaler INHALE 2 PUFFS INTO THE LUNGS EVERY 4-6 HOURS AS NEEDED   ALPRAZolam (XANAX) 0.5 MG tablet Take 1 tablet (0.5 mg total) by mouth daily as needed for anxiety (may repeat dose if needed).   amphetamine-dextroamphetamine (ADDERALL XR) 15 MG 24 hr capsule TAKE 1 CAPSULE BY MOUTH EVERY MORNING   BREO ELLIPTA 100-25 MCG/INH AEPB INHALE 1 PUFF INTO THE LUNGS DAILY (Patient taking differently: Inhale 1 puff into the lungs every morning.)   Cholecalciferol (VITAMIN D PO) Take 1 tablet by mouth daily.   COLLAGEN PO Take 2 Scoops by mouth daily.   MAGNESIUM CARBONATE PO Take 1 tablet by mouth daily.   tretinoin (RETIN-A) 0.1 % cream Apply topically at bedtime. (Patient taking differently: Apply 1 application topically 2 (two) times a week.)   No facility-administered medications prior to visit.    Review of Systems  {Labs  Heme  Chem  Endocrine  Serology  Results Review (optional):23779}   Objective    There were no vitals taken for this visit. {Show previous vital signs (optional):23777}  Physical Exam  ***  No results found for any visits on 11/29/21.  Assessment & Plan     ***  No follow-ups on file.      {provider  attestation***:1}   [name], [place] [phone] (phone) [phone] (fax)  [place]

## 2021-11-29 ENCOUNTER — Ambulatory Visit: Payer: Self-pay | Admitting: Family Medicine

## 2021-11-29 ENCOUNTER — Ambulatory Visit (INDEPENDENT_AMBULATORY_CARE_PROVIDER_SITE_OTHER): Payer: 59 | Admitting: Family Medicine

## 2021-11-29 ENCOUNTER — Encounter: Payer: Self-pay | Admitting: Family Medicine

## 2021-11-29 VITALS — BP 119/77 | HR 81 | Temp 98.4°F | Resp 16 | Ht 63.0 in | Wt 210.7 lb

## 2021-11-29 DIAGNOSIS — F332 Major depressive disorder, recurrent severe without psychotic features: Secondary | ICD-10-CM

## 2021-11-29 DIAGNOSIS — E669 Obesity, unspecified: Secondary | ICD-10-CM | POA: Insufficient documentation

## 2021-11-29 DIAGNOSIS — R69 Illness, unspecified: Secondary | ICD-10-CM | POA: Diagnosis not present

## 2021-11-29 DIAGNOSIS — F900 Attention-deficit hyperactivity disorder, predominantly inattentive type: Secondary | ICD-10-CM

## 2021-11-29 DIAGNOSIS — E66811 Obesity, class 1: Secondary | ICD-10-CM | POA: Insufficient documentation

## 2021-11-29 MED ORDER — BUPROPION HCL ER (XL) 150 MG PO TB24: 150.0000 mg | ORAL_TABLET | Freq: Every day | ORAL | 1 refills | Status: DC

## 2021-11-29 MED ORDER — AMPHETAMINE-DEXTROAMPHETAMINE 7.5 MG PO TABS: 7.5000 mg | ORAL_TABLET | Freq: Two times a day (BID) | ORAL | 0 refills | Status: DC

## 2021-11-29 NOTE — Assessment & Plan Note (Signed)
Chronic, worsening since anxiety/depression has increased Wishes to try IR Adderall with use of BID dosing Will report back in 2 months on concentration improvement

## 2021-11-29 NOTE — Assessment & Plan Note (Signed)
Body mass index is 37.32 kg/m. Discussed importance of healthy weight management Discussed diet and exercise

## 2021-11-29 NOTE — Patient Instructions (Signed)
[facility]  [place], [place] [address]  [phone]

## 2021-11-29 NOTE — Assessment & Plan Note (Signed)
Acute, stable Denies SI or HI Daughters, x2, have moved in with patient for Summer -one of which, has been kicked out of college Wishes to restart Rx Will restart Wellbutrin per her preference; recommend repeat OV in 2 months Seek EAP or counseling through [facility]

## 2021-11-29 NOTE — Progress Notes (Signed)
Established patient visit   Patient: Wendy [name]   DOB: Feb 24, 1976   46 y.o. Adult  MRN: [id number] Visit Date: 11/29/2021  Today's healthcare provider: [name], FNP  Re Introduced to nurse practitioner role and practice setting.  All questions answered.  Discussed provider/patient relationship and expectations.   I,Tiffany J Bragg,acting as a scribe for [name], FNP.,have documented all relevant documentation on the behalf of [name], FNP,as directed by  [name], FNP while in the presence of [name], FNP.   Chief Complaint  Patient presents with   Anxiety    Patient complains of increased anxiety and wants to be prescribed meds again.    Depression    Patient also complains of increased depression lately.    Subjective    HPI HPI     Anxiety    Additional comments: Patient complains of increased anxiety and wants to be prescribed meds again.         Depression    Additional comments: Patient also complains of increased depression lately.       Last edited by Smitty Knudsen, CMA on 11/29/2021  1:08 PM.        Medications: Outpatient Medications Prior to Visit  Medication Sig   albuterol (VENTOLIN HFA) 108 (90 Base) MCG/ACT inhaler INHALE 2 PUFFS INTO THE LUNGS EVERY 4-6 HOURS AS NEEDED   ALPRAZolam (XANAX) 0.5 MG tablet Take 1 tablet (0.5 mg total) by mouth daily as needed for anxiety (may repeat dose if needed).   BREO ELLIPTA 100-25 MCG/INH AEPB INHALE 1 PUFF INTO THE LUNGS DAILY (Patient taking differently: Inhale 1 puff into the lungs every morning.)   Cholecalciferol (VITAMIN D PO) Take 1 tablet by mouth daily.   COLLAGEN PO Take 2 Scoops by mouth daily.   MAGNESIUM CARBONATE PO Take 1 tablet by mouth daily.   tretinoin (RETIN-A) 0.1 % cream Apply topically at bedtime. (Patient taking differently: Apply 1 application  topically 2 (two) times a week.)   [DISCONTINUED] amphetamine-dextroamphetamine (ADDERALL XR) 15 MG 24 hr  capsule TAKE 1 CAPSULE BY MOUTH EVERY MORNING   No facility-administered medications prior to visit.    Review of Systems  Last CBC Lab Results  Component Value Date   WBC 7.1 07/26/2021   HGB 12.7 07/26/2021   HCT 37.8 07/26/2021   MCV 89.2 07/26/2021   MCH 30.0 07/26/2021   RDW 12.8 07/26/2021   PLT 254 33/82/5053   Last metabolic panel Lab Results  Component Value Date   GLUCOSE 86 07/26/2021   NA 135 07/26/2021   K 3.6 07/26/2021   CL 106 07/26/2021   CO2 24 07/26/2021   BUN 11 07/26/2021   CREATININE 0.69 07/26/2021   GFRNONAA >60 07/26/2021   CALCIUM 8.9 07/26/2021   PROT 6.1 06/03/2020   ALBUMIN 4.0 06/03/2020   LABGLOB 2.1 06/03/2020   AGRATIO 1.9 06/03/2020   BILITOT 0.5 06/03/2020   ALKPHOS 63 06/03/2020   AST 18 06/03/2020   ALT 16 06/03/2020   ANIONGAP 5 07/26/2021   Last lipids Lab Results  Component Value Date   CHOL 191 06/03/2020   HDL 82 06/03/2020   LDLCALC 99 06/03/2020   TRIG 51 06/03/2020   CHOLHDL 2.3 06/03/2020   Last hemoglobin A1c Lab Results  Component Value Date   HGBA1C 5.0 06/03/2020   Last thyroid functions Lab Results  Component Value Date   TSH 1.260 06/03/2020   T4TOTAL 7.4 06/03/2020  Objective    BP 119/77 (BP Location: Right Arm, Patient Position: Sitting, Cuff Size: Normal)   Pulse 81   Temp 98.4 F (36.9 C) (Oral)   Resp 16   Ht '5\' 3"'$  (1.6 m)   Wt 210 lb 11.2 oz (95.6 kg)   SpO2 98%   BMI 37.32 kg/m  BP Readings from Last 3 Encounters:  11/29/21 119/77  07/29/21 101/66  06/01/21 110/82   Wt Readings from Last 3 Encounters:  11/29/21 210 lb 11.2 oz (95.6 kg)  07/29/21 210 lb (95.3 kg)  06/01/21 212 lb 4.8 oz (96.3 kg)   SpO2 Readings from Last 3 Encounters:  11/29/21 98%  07/29/21 100%  06/01/21 100%      Physical Exam Vitals and nursing note reviewed.  Constitutional:      General: He is not in acute distress.    Appearance: Normal appearance. He is overweight. He is not  ill-appearing, toxic-appearing or diaphoretic.  HENT:     Head: Normocephalic and atraumatic.  Cardiovascular:     Rate and Rhythm: Normal rate and regular rhythm.     Pulses: Normal pulses.     Heart sounds: Normal heart sounds. No murmur heard.    No friction rub. No gallop.  Pulmonary:     Effort: Pulmonary effort is normal. No respiratory distress.     Breath sounds: Normal breath sounds. No stridor. No wheezing, rhonchi or rales.  Chest:     Chest wall: No tenderness.  Abdominal:     General: Bowel sounds are normal.     Palpations: Abdomen is soft.  Musculoskeletal:        General: No swelling, tenderness, deformity or signs of injury. Normal range of motion.     Right lower leg: No edema.     Left lower leg: No edema.  Skin:    General: Skin is warm and dry.     Capillary Refill: Capillary refill takes less than 2 seconds.     Coloration: Skin is not jaundiced or pale.     Findings: No bruising, erythema, lesion or rash.  Neurological:     General: No focal deficit present.     Mental Status: He is alert and oriented to person, place, and time. Mental status is at baseline.     Cranial Nerves: No cranial nerve deficit.     Sensory: No sensory deficit.     Motor: No weakness.     Coordination: Coordination normal.  Psychiatric:        Mood and Affect: Mood normal. Affect is tearful.        Behavior: Behavior normal.        Thought Content: Thought content normal.        Judgment: Judgment normal.      No results found for any visits on 11/29/21.  Assessment & Plan     Problem List Items Addressed This Visit       Other   Attention deficit hyperactivity disorder (ADHD), predominantly inattentive type    Chronic, worsening since anxiety/depression has increased Wishes to try IR Adderall with use of BID dosing Will report back in 2 months on concentration improvement       Relevant Medications   amphetamine-dextroamphetamine (ADDERALL) 7.5 MG tablet    Depression - Primary    Acute, stable Denies SI or HI Daughters, x2, have moved in with patient for Summer -one of which, has been kicked out of college Wishes to restart Rx Will restart Wellbutrin per  her preference; recommend repeat OV in 2 months Seek EAP or counseling through [facility]       Relevant Medications   buPROPion (WELLBUTRIN XL) 150 MG 24 hr tablet   Obesity (BMI 35.0-39.9 without comorbidity)    Body mass index is 37.32 kg/m. Discussed importance of healthy weight management Discussed diet and exercise       Relevant Medications   amphetamine-dextroamphetamine (ADDERALL) 7.5 MG tablet     Return in about 8 weeks (around 01/24/2022) for anxiety and depression.      Vonna Kotyk, FNP, have reviewed all documentation for this visit. The documentation on 11/29/21 for the exam, diagnosis, procedures, and orders are all accurate and complete.    [name], [place] [phone] (phone) [phone] (fax)  [place]

## 2021-12-06 DIAGNOSIS — N939 Abnormal uterine and vaginal bleeding, unspecified: Secondary | ICD-10-CM | POA: Diagnosis not present

## 2021-12-16 DIAGNOSIS — E669 Obesity, unspecified: Secondary | ICD-10-CM | POA: Diagnosis not present

## 2021-12-16 DIAGNOSIS — Z6836 Body mass index (BMI) 36.0-36.9, adult: Secondary | ICD-10-CM | POA: Diagnosis not present

## 2022-01-07 ENCOUNTER — Other Ambulatory Visit: Payer: Self-pay | Admitting: Family Medicine

## 2022-01-07 DIAGNOSIS — F900 Attention-deficit hyperactivity disorder, predominantly inattentive type: Secondary | ICD-10-CM

## 2022-01-07 NOTE — Telephone Encounter (Signed)
Requested medication (s) are due for refill today - yes  Requested medication (s) are on the active medication list -yes  Future visit scheduled -yes  Last refill: 11/29/21 #60  Notes to clinic: non delegated Rx  Requested Prescriptions  Pending Prescriptions Disp Refills   amphetamine-dextroamphetamine (ADDERALL) 7.5 MG tablet [Pharmacy Med Name: AMPHETAMINE-DEXTROAMPHETAMINE 7.5 M] 60 tablet     Sig: TAKE 1 TABLET BY MOUTH TWICE DAILY     Not Delegated - Psychiatry:  Stimulants/ADHD Failed - 01/07/2022  9:55 AM      Failed - This refill cannot be delegated      Failed - Urine Drug Screen completed in last 360 days      Passed - Last BP in normal range    BP Readings from Last 1 Encounters:  11/29/21 119/77         Passed - Last Heart Rate in normal range    Pulse Readings from Last 1 Encounters:  11/29/21 81         Passed - Valid encounter within last 6 months    Recent Outpatient Visits           1 month ago Severe episode of recurrent major depressive disorder, without psychotic features ([place])   [facility] Tally Joe T, FNP   7 months ago Menorrhagia with irregular cycle   [facility] Tally Joe T, FNP   7 months ago Attention deficit hyperactivity disorder (ADHD), predominantly inattentive type   [facility] [name], FNP   1 year ago Acute vaginitis   [place] [facility], Kelby Aline, FNP   1 year ago Exposure to STD   [facility], Kelby Aline, [place]       Future Appointments             In 3 weeks [name], [place], [place]               Requested Prescriptions  Pending Prescriptions Disp Refills   amphetamine-dextroamphetamine (ADDERALL) 7.5 MG tablet [Pharmacy Med Name: AMPHETAMINE-DEXTROAMPHETAMINE 7.5 M] 60 tablet     Sig: TAKE 1 TABLET BY MOUTH TWICE DAILY     Not Delegated - Psychiatry:  Stimulants/ADHD Failed - 01/07/2022   9:55 AM      Failed - This refill cannot be delegated      Failed - Urine Drug Screen completed in last 360 days      Passed - Last BP in normal range    BP Readings from Last 1 Encounters:  11/29/21 119/77         Passed - Last Heart Rate in normal range    Pulse Readings from Last 1 Encounters:  11/29/21 81         Passed - Valid encounter within last 6 months    Recent Outpatient Visits           1 month ago Severe episode of recurrent major depressive disorder, without psychotic features [facility])   [facility] [name], FNP   7 months ago Menorrhagia with irregular cycle   [facility] Tally Joe T, FNP   7 months ago Attention deficit hyperactivity disorder (ADHD), predominantly inattentive type   [facility] [name], FNP   1 year ago Acute vaginitis   [place] [facility], Kelby Aline, FNP   1 year ago Exposure to STD   [place], Kelby Aline, FNP  Future Appointments             In 3 weeks [name], [place], [facility]

## 2022-01-27 ENCOUNTER — Ambulatory Visit
Admission: RE | Admit: 2022-01-27 | Discharge: 2022-01-27 | Disposition: A | Discharge status: Home / Self Care | Payer: 59 | Source: Ambulatory Visit | Attending: Family Medicine | Admitting: Family Medicine

## 2022-01-27 DIAGNOSIS — Z1231 Encounter for screening mammogram for malignant neoplasm of breast: Secondary | ICD-10-CM | POA: Insufficient documentation

## 2022-01-28 ENCOUNTER — Other Ambulatory Visit: Payer: Self-pay | Admitting: Family Medicine

## 2022-01-28 DIAGNOSIS — R928 Other abnormal and inconclusive findings on diagnostic imaging of breast: Secondary | ICD-10-CM

## 2022-01-28 NOTE — Progress Notes (Signed)
Incomplete breast cancer screening; patient should be contacted by oncology team to assist with complete screening.  Take care, Wendy [name], [address] [place], [place] [address] [phone] (phone) [phone] (fax) [place]

## 2022-02-01 NOTE — Progress Notes (Deleted)
      Established patient visit   Patient: Wendy [name]   DOB: April 22, 1976   46 y.o. Adult  MRN: [id number] Visit Date: 02/03/2022  Today's healthcare provider: [name], FNP   No chief complaint on file.  Subjective    HPI  Depression, Follow-up  He  was last seen for this 2 months ago. Changes made at last visit include restarted wellbutrin '150mg'$ .   He reports {excellent/good/fair/poor:19665} compliance with treatment. He {is/is not:21021397} having side effects. ***  He reports {DESC; GOOD/FAIR/POOR:18685} tolerance of treatment. Current symptoms include: {Symptoms; depression:1002} He feels he is {improved/worse/unchanged:3041574} since last visit.     11/29/2021    1:15 PM 05/18/2021    9:16 AM 06/03/2020    8:48 AM  Depression screen PHQ 2/9  Decreased Interest '3 1 1  '$ Down, Depressed, Hopeless '3 1 1  '$ PHQ - 2 Score '6 2 2  '$ Altered sleeping 1 0 1  Tired, decreased energy '2 1 1  '$ Change in appetite 3 1 0  Feeling bad or failure about yourself  '3 1 1  '$ Trouble concentrating '3 2 1  '$ Moving slowly or fidgety/restless 1 0 0  Suicidal thoughts 0 0 0  PHQ-9 Score '19 7 6  '$ Difficult doing work/chores Very difficult Not difficult at all Not difficult at all    -----------------------------------------------------------------------------------------   Medications: Outpatient Medications Prior to Visit  Medication Sig   albuterol (VENTOLIN HFA) 108 (90 Base) MCG/ACT inhaler INHALE 2 PUFFS INTO THE LUNGS EVERY 4-6 HOURS AS NEEDED   ALPRAZolam (XANAX) 0.5 MG tablet Take 1 tablet (0.5 mg total) by mouth daily as needed for anxiety (may repeat dose if needed).   amphetamine-dextroamphetamine (ADDERALL) 7.5 MG tablet TAKE 1 TABLET BY MOUTH TWICE DAILY   BREO ELLIPTA 100-25 MCG/INH AEPB INHALE 1 PUFF INTO THE LUNGS DAILY (Patient taking differently: Inhale 1 puff into the lungs every morning.)   buPROPion (WELLBUTRIN XL) 150 MG 24 hr tablet Take 1 tablet (150 mg total) by mouth  daily.   Cholecalciferol (VITAMIN D PO) Take 1 tablet by mouth daily.   COLLAGEN PO Take 2 Scoops by mouth daily.   MAGNESIUM CARBONATE PO Take 1 tablet by mouth daily.   tretinoin (RETIN-A) 0.1 % cream Apply topically at bedtime. (Patient taking differently: Apply 1 application  topically 2 (two) times a week.)   No facility-administered medications prior to visit.    Review of Systems  {Labs  Heme  Chem  Endocrine  Serology  Results Review (optional):23779}   Objective    There were no vitals taken for this visit. {Show previous vital signs (optional):23777}  Physical Exam  ***  No results found for any visits on 02/03/22.  Assessment & Plan     ***  No follow-ups on file.      {provider attestation***:1}   [name], [place] [phone] (phone) [phone] (fax)  [place]

## 2022-02-03 ENCOUNTER — Ambulatory Visit: Payer: 59 | Admitting: Family Medicine

## 2022-02-07 NOTE — Progress Notes (Unsigned)
Established patient visit  I,Joseline E Rosas,acting as a scribe for [name], FNP.,have documented all relevant documentation on the behalf of [name], FNP,as directed by  [name], FNP while in the presence of [name], FNP.   Patient: Wendy [name]   DOB: March 24, 1976   46 y.o. Adult  MRN: [id number] Visit Date: 02/10/2022  Today's healthcare provider: [name], FNP  RE Introduced to nurse practitioner role and practice setting.  All questions answered.  Discussed provider/patient relationship and expectations.   Chief Complaint  Patient presents with   Depression   Subjective    HPI  Depression, Follow-up  He  was last seen for this 2 months ago. Changes made at last visit include start wellbutrin '150mg'$ .   He reports excellent compliance with treatment. He is not having side effects.   He reports excellent tolerance of treatment. Current symptoms include: depressed mood and difficulty concentrating He feels he is Improved since last visit.     02/10/2022    8:06 AM 11/29/2021    1:15 PM 05/18/2021    9:16 AM  Depression screen PHQ 2/9  Decreased Interest '1 3 1  '$ Down, Depressed, Hopeless '1 3 1  '$ PHQ - 2 Score '2 6 2  '$ Altered sleeping 0 1 0  Tired, decreased energy 0 2 1  Change in appetite '1 3 1  '$ Feeling bad or failure about yourself  '1 3 1  '$ Trouble concentrating '1 3 2  '$ Moving slowly or fidgety/restless 0 1 0  Suicidal thoughts 0 0 0  PHQ-9 Score '5 19 7  '$ Difficult doing work/chores Not difficult at all Very difficult Not difficult at all    -----------------------------------------------------------------------------------------     02/10/2022    8:10 AM  GAD 7 : Generalized Anxiety Score  Nervous, Anxious, on Edge 1  Control/stop worrying 1  Worry too much - different things 1  Trouble relaxing 1  Restless 1  Easily annoyed or irritable 0  Afraid - awful might happen 1  Total GAD 7 Score 6  Anxiety Difficulty Somewhat  difficult     Medications: Outpatient Medications Prior to Visit  Medication Sig   albuterol (VENTOLIN HFA) 108 (90 Base) MCG/ACT inhaler INHALE 2 PUFFS INTO THE LUNGS EVERY 4-6 HOURS AS NEEDED   ALPRAZolam (XANAX) 0.5 MG tablet Take 1 tablet (0.5 mg total) by mouth daily as needed for anxiety (may repeat dose if needed).   amphetamine-dextroamphetamine (ADDERALL) 7.5 MG tablet TAKE 1 TABLET BY MOUTH TWICE DAILY   Cholecalciferol (VITAMIN D PO) Take 1 tablet by mouth daily.   COLLAGEN PO Take 2 Scoops by mouth daily.   MAGNESIUM CARBONATE PO Take 1 tablet by mouth daily.   tretinoin (RETIN-A) 0.1 % cream Apply topically at bedtime. (Patient taking differently: Apply 1 application  topically 2 (two) times a week.)   [DISCONTINUED] buPROPion (WELLBUTRIN XL) 150 MG 24 hr tablet Take 1 tablet (150 mg total) by mouth daily.   BREO ELLIPTA 100-25 MCG/INH AEPB INHALE 1 PUFF INTO THE LUNGS DAILY (Patient taking differently: Inhale 1 puff into the lungs every morning.)   No facility-administered medications prior to visit.    Review of Systems    Objective    BP 113/81 (BP Location: Left Arm, Patient Position: Sitting, Cuff Size: Large)   Pulse 90   Resp 16   Ht '5\' 3"'$  (1.6 m)   Wt 202 lb 9.6 oz (91.9 kg)   BMI 35.89  kg/m   Physical Exam Vitals and nursing note reviewed.  Constitutional:      General: He is not in acute distress.    Appearance: Normal appearance. He is obese. He is not ill-appearing, toxic-appearing or diaphoretic.  HENT:     Head: Normocephalic and atraumatic.  Cardiovascular:     Rate and Rhythm: Normal rate and regular rhythm.     Pulses: Normal pulses.     Heart sounds: Normal heart sounds. No murmur heard.    No friction rub. No gallop.  Pulmonary:     Effort: Pulmonary effort is normal. No respiratory distress.     Breath sounds: Normal breath sounds. No stridor. No wheezing, rhonchi or rales.  Chest:     Chest wall: No tenderness.  Musculoskeletal:         General: No swelling, tenderness, deformity or signs of injury. Normal range of motion.     Right lower leg: No edema.     Left lower leg: No edema.  Skin:    General: Skin is warm and dry.     Capillary Refill: Capillary refill takes less than 2 seconds.     Coloration: Skin is not jaundiced or pale.     Findings: No bruising, erythema, lesion or rash.  Neurological:     General: No focal deficit present.     Mental Status: He is alert and oriented to person, place, and time. Mental status is at baseline.     Cranial Nerves: No cranial nerve deficit.     Sensory: No sensory deficit.     Motor: No weakness.     Coordination: Coordination normal.  Psychiatric:        Mood and Affect: Mood normal.        Behavior: Behavior normal.        Thought Content: Thought content normal.        Judgment: Judgment normal.      No results found for any visits on 02/10/22.  Assessment & Plan     Problem List Items Addressed This Visit       Other   Depression - Primary    Chronic, improved Wishes to titrate Wellbutrin XR to 300 mg to assist with anxiety symptoms from daughter current issues/living situation Has been working on diet and exercise; made aware that medication can also assist with binge eating behaviors Contracted for safety; denies SI or HI OK for refills in 6 months or with CPE      Relevant Medications   buPROPion (WELLBUTRIN XL) 300 MG 24 hr tablet     Return for annual examination.      Vonna Kotyk, FNP, have reviewed all documentation for this visit. The documentation on 02/10/22 for the exam, diagnosis, procedures, and orders are all accurate and complete.    [name], [place] [phone] (phone) [phone] (fax)  [place]

## 2022-02-10 ENCOUNTER — Ambulatory Visit (INDEPENDENT_AMBULATORY_CARE_PROVIDER_SITE_OTHER): Payer: 59 | Admitting: Family Medicine

## 2022-02-10 ENCOUNTER — Encounter: Payer: Self-pay | Admitting: Family Medicine

## 2022-02-10 VITALS — BP 113/81 | HR 90 | Resp 16 | Ht 63.0 in | Wt 202.6 lb

## 2022-02-10 DIAGNOSIS — F332 Major depressive disorder, recurrent severe without psychotic features: Secondary | ICD-10-CM | POA: Diagnosis not present

## 2022-02-10 DIAGNOSIS — R69 Illness, unspecified: Secondary | ICD-10-CM | POA: Diagnosis not present

## 2022-02-10 MED ORDER — BUPROPION HCL ER (XL) 300 MG PO TB24: 300.0000 mg | ORAL_TABLET | Freq: Every day | ORAL | 1 refills | Status: DC

## 2022-02-10 NOTE — Assessment & Plan Note (Signed)
Chronic, improved Wishes to titrate Wellbutrin XR to 300 mg to assist with anxiety symptoms from daughter current issues/living situation Has been working on diet and exercise; made aware that medication can also assist with binge eating behaviors Contracted for safety; denies SI or HI OK for refills in 6 months or with CPE

## 2022-02-22 ENCOUNTER — Ambulatory Visit
Admission: RE | Admit: 2022-02-22 | Discharge: 2022-02-22 | Disposition: A | Discharge status: Home / Self Care | Payer: 59 | Source: Ambulatory Visit | Attending: Family Medicine | Admitting: Family Medicine

## 2022-02-22 DIAGNOSIS — R928 Other abnormal and inconclusive findings on diagnostic imaging of breast: Secondary | ICD-10-CM

## 2022-02-22 DIAGNOSIS — R922 Inconclusive mammogram: Secondary | ICD-10-CM | POA: Diagnosis not present

## 2022-02-23 NOTE — Progress Notes (Signed)
Hi Wendy [name]  Normal mammogram following [place]; repeat in 1 year.  Please let us know if you have any questions.  Thank you,  Tally Joe, FNP

## 2022-05-09 ENCOUNTER — Other Ambulatory Visit: Payer: Self-pay | Admitting: Family Medicine

## 2022-05-09 DIAGNOSIS — F900 Attention-deficit hyperactivity disorder, predominantly inattentive type: Secondary | ICD-10-CM

## 2022-06-27 ENCOUNTER — Other Ambulatory Visit: Payer: Self-pay | Admitting: Family Medicine

## 2022-06-27 DIAGNOSIS — F900 Attention-deficit hyperactivity disorder, predominantly inattentive type: Secondary | ICD-10-CM

## 2022-07-28 DIAGNOSIS — J029 Acute pharyngitis, unspecified: Secondary | ICD-10-CM | POA: Diagnosis not present

## 2022-07-28 DIAGNOSIS — Z6834 Body mass index (BMI) 34.0-34.9, adult: Secondary | ICD-10-CM | POA: Diagnosis not present

## 2022-08-08 ENCOUNTER — Ambulatory Visit
Admission: EM | Admit: 2022-08-08 | Discharge: 2022-08-08 | Disposition: A | Discharge status: Home / Self Care | Payer: 59

## 2022-08-24 ENCOUNTER — Encounter: Payer: Self-pay | Admitting: Family Medicine

## 2022-08-24 ENCOUNTER — Ambulatory Visit (INDEPENDENT_AMBULATORY_CARE_PROVIDER_SITE_OTHER): Payer: 59 | Admitting: Family Medicine

## 2022-08-24 VITALS — BP 113/82 | HR 87 | Ht 63.0 in | Wt 201.0 lb

## 2022-08-24 DIAGNOSIS — Z1329 Encounter for screening for other suspected endocrine disorder: Secondary | ICD-10-CM | POA: Insufficient documentation

## 2022-08-24 DIAGNOSIS — Z131 Encounter for screening for diabetes mellitus: Secondary | ICD-10-CM | POA: Diagnosis not present

## 2022-08-24 DIAGNOSIS — F410 Panic disorder [episodic paroxysmal anxiety] without agoraphobia: Secondary | ICD-10-CM | POA: Insufficient documentation

## 2022-08-24 DIAGNOSIS — Z1211 Encounter for screening for malignant neoplasm of colon: Secondary | ICD-10-CM | POA: Insufficient documentation

## 2022-08-24 DIAGNOSIS — U099 Post covid-19 condition, unspecified: Secondary | ICD-10-CM | POA: Diagnosis not present

## 2022-08-24 DIAGNOSIS — E669 Obesity, unspecified: Secondary | ICD-10-CM | POA: Diagnosis not present

## 2022-08-24 DIAGNOSIS — J4531 Mild persistent asthma with (acute) exacerbation: Secondary | ICD-10-CM | POA: Insufficient documentation

## 2022-08-24 DIAGNOSIS — R002 Palpitations: Secondary | ICD-10-CM | POA: Diagnosis not present

## 2022-08-24 MED ORDER — PREDNISONE 50 MG PO TABS: 50.0000 mg | ORAL_TABLET | Freq: Every day | ORAL | 0 refills | Status: DC

## 2022-08-24 MED ORDER — FAMOTIDINE 20 MG PO TABS: 20.0000 mg | ORAL_TABLET | Freq: Two times a day (BID) | ORAL | 1 refills | Status: DC

## 2022-08-24 MED ORDER — CLONAZEPAM 0.5 MG PO TABS: 0.5000 mg | ORAL_TABLET | Freq: Every day | ORAL | 5 refills | Status: DC | PRN

## 2022-08-24 MED ORDER — AZELASTINE HCL 0.1 % NA SOLN: 2.0000 | Freq: Two times a day (BID) | NASAL | 12 refills | Status: DC

## 2022-08-24 MED ORDER — FLUTICASONE-SALMETEROL 100-50 MCG/ACT IN AEPB: 1.0000 | INHALATION_SPRAY | Freq: Two times a day (BID) | RESPIRATORY_TRACT | 3 refills | Status: DC

## 2022-08-24 NOTE — Assessment & Plan Note (Signed)
Not improved with use of PRN albuterol; recommend trial of Advair and 5 day course of prednisone given recent flare Once stabilized; recommend f/u with pulm for breathing tests

## 2022-08-24 NOTE — Assessment & Plan Note (Signed)
Chronic, improved Body mass index is 35.61 kg/m.

## 2022-08-24 NOTE — Assessment & Plan Note (Signed)
Recommend A1c given morbid obesity

## 2022-08-24 NOTE — Assessment & Plan Note (Signed)
Agreeable to cologuard; kit ordered

## 2022-08-24 NOTE — Assessment & Plan Note (Signed)
Recommend tsh in setting of palpitations

## 2022-08-24 NOTE — Assessment & Plan Note (Signed)
Chronic, Body mass index is 35.61 kg/m. Associated with depression/anxiety and HLD

## 2022-08-24 NOTE — Progress Notes (Signed)
Established patient visit  Patient: Wendy [name]   DOB: 1975/07/12   47 y.o. Female  MRN: [id number] Visit Date: 08/24/2022  Today's healthcare provider: [name], FNP  Re Introduced to nurse practitioner role and practice setting.  All questions answered.  Discussed provider/patient relationship and expectations.  Subjective    HPI HPI   Pt stated--since had the COVID in January --coughing, fatigue, breathing problem, sinus pressure. Denied fever and using inhaler. Last edited by Elta Guadeloupe, CMA on 08/24/2022  9:33 AM.      Reports respiratory and anxiety symptoms since having COVID >1 month ago.  Medications: Outpatient Medications Prior to Visit  Medication Sig   albuterol (VENTOLIN HFA) 108 (90 Base) MCG/ACT inhaler INHALE 2 PUFFS INTO THE LUNGS EVERY 4-6 HOURS AS NEEDED   amphetamine-dextroamphetamine (ADDERALL) 7.5 MG tablet TAKE 1 TABLET BY MOUTH TWICE DAILY   buPROPion (WELLBUTRIN XL) 300 MG 24 hr tablet Take 1 tablet (300 mg total) by mouth daily.   Cholecalciferol (VITAMIN D PO) Take 1 tablet by mouth daily.   COLLAGEN PO Take 2 Scoops by mouth daily.   MAGNESIUM CARBONATE PO Take 1 tablet by mouth daily.   tretinoin (RETIN-A) 0.1 % cream Apply topically at bedtime. (Patient taking differently: Apply 1 application  topically 2 (two) times a week.)   [DISCONTINUED] ALPRAZolam (XANAX) 0.5 MG tablet Take 1 tablet (0.5 mg total) by mouth daily as needed for anxiety (may repeat dose if needed). (Patient not taking: Reported on 08/24/2022)   No facility-administered medications prior to visit.    Review of Systems     Objective    BP 113/82 (BP Location: Left Arm, Patient Position: Sitting, Cuff Size: Normal)   Pulse 87   Ht '5\' 3"'$  (1.6 m)   Wt 201 lb (91.2 kg)   SpO2 100%   BMI 35.61 kg/m    Physical Exam Vitals and nursing note reviewed.  Constitutional:      General: She is not in acute distress.    Appearance: Normal appearance. She is obese. She is not  ill-appearing, toxic-appearing or diaphoretic.  HENT:     Head: Normocephalic and atraumatic.     Right Ear: Tympanic membrane, ear canal and external ear normal.     Left Ear: Tympanic membrane, ear canal and external ear normal.     Nose: Nose normal.     Mouth/Throat:     Mouth: Mucous membranes are moist.     Pharynx: Oropharynx is clear.  Eyes:     Extraocular Movements: Extraocular movements intact.     Conjunctiva/sclera: Conjunctivae normal.     Pupils: Pupils are equal, round, and reactive to light.  Cardiovascular:     Rate and Rhythm: Normal rate and regular rhythm.     Pulses: Normal pulses.     Heart sounds: Normal heart sounds. No murmur heard.    No friction rub. No gallop.  Pulmonary:     Effort: Pulmonary effort is normal. No respiratory distress.     Breath sounds: Normal breath sounds. No stridor. No wheezing, rhonchi or rales.  Chest:     Chest wall: No tenderness.  Musculoskeletal:        General: No swelling, tenderness, deformity or signs of injury. Normal range of motion.     Right lower leg: No edema.     Left lower leg: No edema.  Skin:    General: Skin is warm and dry.     Capillary Refill: Capillary refill  takes less than 2 seconds.     Coloration: Skin is not jaundiced or pale.     Findings: No bruising, erythema, lesion or rash.  Neurological:     General: No focal deficit present.     Mental Status: She is alert and oriented to person, place, and time. Mental status is at baseline.     Cranial Nerves: No cranial nerve deficit.     Sensory: No sensory deficit.     Motor: No weakness.     Coordination: Coordination normal.  Psychiatric:        Mood and Affect: Mood normal.        Behavior: Behavior normal.        Thought Content: Thought content normal.        Judgment: Judgment normal.     No results found for any visits on 08/24/22.  Assessment & Plan     Problem List Items Addressed This Visit       Respiratory   Mild persistent  asthma with exacerbation    Not improved with use of PRN albuterol; recommend trial of Advair and 5 day course of prednisone given recent flare Once stabilized; recommend f/u with pulm for breathing tests       Relevant Medications   azelastine (ASTELIN) 0.1 % nasal spray   predniSONE (DELTASONE) 50 MG tablet   fluticasone-salmeterol (ADVAIR) 100-50 MCG/ACT AEPB     Other   Long COVID - Primary   Relevant Medications   azelastine (ASTELIN) 0.1 % nasal spray   predniSONE (DELTASONE) 50 MG tablet   fluticasone-salmeterol (ADVAIR) 100-50 MCG/ACT AEPB   Morbid obesity (HCC)    Chronic, Body mass index is 35.61 kg/m. Associated with depression/anxiety and HLD       Relevant Orders   CBC with Differential/Platelet   Comprehensive Metabolic Panel (CMET)   TSH + free T4   Lipid panel   Hemoglobin A1c   Obesity (BMI 35.0-39.9 without comorbidity)    Chronic, improved Body mass index is 35.61 kg/m.       Palpitations    Acute, with exertion Reports good fluid intake Recommend trial of advair to assist breathing Will check CBC, CMP and TSH      Relevant Orders   CBC with Differential/Platelet   Comprehensive Metabolic Panel (CMET)   TSH + free T4   Lipid panel   Hemoglobin A1c   Panic attacks    Acute on chronic, worsening iso breathing difficulties OK to resume low dose benzo as abortive medication to assist wellbutrin 300 xl PDMP reviewed       Relevant Medications   clonazePAM (KLONOPIN) 0.5 MG tablet   Screening for colon cancer    Agreeable to cologuard; kit ordered       Relevant Orders   Cologuard   Screening for diabetes mellitus    Recommend A1c given morbid obesity      Relevant Orders   Hemoglobin A1c   Screening for thyroid disorder    Recommend tsh in setting of palpitations      Relevant Orders   TSH + free T4   Return if symptoms worsen or fail to improve.     Vonna Kotyk, FNP, have reviewed all documentation for this visit. The  documentation on 08/24/22 for the exam, diagnosis, procedures, and orders are all accurate and complete.  [name], [place] [phone] (phone) [phone] (fax)  [place]

## 2022-08-24 NOTE — Assessment & Plan Note (Signed)
Acute, with exertion Reports good fluid intake Recommend trial of advair to assist breathing Will check CBC, CMP and TSH

## 2022-08-24 NOTE — Assessment & Plan Note (Signed)
Acute on chronic, worsening iso breathing difficulties OK to resume low dose benzo as abortive medication to assist wellbutrin 300 xl PDMP reviewed

## 2022-08-25 LAB — HEMOGLOBIN A1C
Est. average glucose Bld gHb Est-mCnc: 100 mg/dL
Hgb A1c MFr Bld: 5.1 % (ref 4.8–5.6)

## 2022-08-25 LAB — CBC WITH DIFFERENTIAL/PLATELET
Basophils Absolute: 0 10*3/uL (ref 0.0–0.2)
Basos: 0 %
EOS (ABSOLUTE): 0.1 10*3/uL (ref 0.0–0.4)
Eos: 1 %
Hematocrit: 40.6 % (ref 34.0–46.6)
Hemoglobin: 13.7 g/dL (ref 11.1–15.9)
Immature Grans (Abs): 0 10*3/uL (ref 0.0–0.1)
Immature Granulocytes: 0 %
Lymphocytes Absolute: 1.9 10*3/uL (ref 0.7–3.1)
Lymphs: 27 %
MCH: 31 pg (ref 26.6–33.0)
MCHC: 33.7 g/dL (ref 31.5–35.7)
MCV: 92 fL (ref 79–97)
Monocytes Absolute: 0.5 10*3/uL (ref 0.1–0.9)
Monocytes: 7 %
Neutrophils Absolute: 4.5 10*3/uL (ref 1.4–7.0)
Neutrophils: 65 %
Platelets: 241 10*3/uL (ref 150–450)
RBC: 4.42 x10E6/uL (ref 3.77–5.28)
RDW: 12.9 % (ref 11.7–15.4)
WBC: 7 10*3/uL (ref 3.4–10.8)

## 2022-08-25 LAB — COMPREHENSIVE METABOLIC PANEL
ALT: 13 IU/L (ref 0–32)
AST: 12 IU/L (ref 0–40)
Albumin/Globulin Ratio: 2.2 (ref 1.2–2.2)
Albumin: 4.3 g/dL (ref 3.9–4.9)
Alkaline Phosphatase: 67 IU/L (ref 44–121)
BUN/Creatinine Ratio: 21 (ref 9–23)
BUN: 13 mg/dL (ref 6–24)
Bilirubin Total: 0.6 mg/dL (ref 0.0–1.2)
CO2: 19 mmol/L — ABNORMAL LOW (ref 20–29)
Calcium: 8.9 mg/dL (ref 8.7–10.2)
Chloride: 104 mmol/L (ref 96–106)
Creatinine, Ser: 0.62 mg/dL (ref 0.57–1.00)
Globulin, Total: 2 g/dL (ref 1.5–4.5)
Glucose: 88 mg/dL (ref 70–99)
Potassium: 4.4 mmol/L (ref 3.5–5.2)
Sodium: 135 mmol/L (ref 134–144)
Total Protein: 6.3 g/dL (ref 6.0–8.5)
eGFR: 111 mL/min/{1.73_m2} (ref >59)

## 2022-08-25 LAB — LIPID PANEL
Chol/HDL Ratio: 2.8 ratio (ref 0.0–4.4)
Cholesterol, Total: 204 mg/dL — ABNORMAL HIGH (ref 100–199)
HDL: 72 mg/dL (ref >39)
LDL Chol Calc (NIH): 123 mg/dL — ABNORMAL HIGH (ref 0–99)
Triglycerides: 48 mg/dL (ref 0–149)
VLDL Cholesterol Cal: 9 mg/dL (ref 5–40)

## 2022-08-25 LAB — TSH+FREE T4
Free T4: 1.28 ng/dL (ref 0.82–1.77)
TSH: 1.28 u[IU]/mL (ref 0.450–4.500)

## 2022-08-25 NOTE — Progress Notes (Signed)
Slight increase in cholesterol; other labs remain normal and stable. Please complete the colo guard kit once it arrives. Please let us know if you have any questions.  Thank you, [name], [name], [name] [address] [phone] (phone) [phone] (fax) [place]

## 2022-09-04 DIAGNOSIS — Z1211 Encounter for screening for malignant neoplasm of colon: Secondary | ICD-10-CM | POA: Diagnosis not present

## 2022-09-10 LAB — COLOGUARD: COLOGUARD: NEGATIVE

## 2022-09-11 NOTE — Progress Notes (Signed)
Negative cologuard; repeat in 3 years.

## 2022-09-14 ENCOUNTER — Other Ambulatory Visit: Payer: Self-pay | Admitting: Family Medicine

## 2022-09-14 DIAGNOSIS — F332 Major depressive disorder, recurrent severe without psychotic features: Secondary | ICD-10-CM

## 2022-09-14 DIAGNOSIS — F900 Attention-deficit hyperactivity disorder, predominantly inattentive type: Secondary | ICD-10-CM

## 2022-09-24 IMAGING — MG MM DIGITAL SCREENING BILAT W/ TOMO AND CAD
8 series · 8 of 24 positions shown · non-contrast
Comparison: Previous exam(s).

CLINICAL DATA: Screening.

EXAM:
DIGITAL SCREENING BILATERAL MAMMOGRAM WITH TOMOSYNTHESIS AND CAD
TECHNIQUE: Bilateral screening digital craniocaudal and mediolateral oblique
mammograms were obtained. Bilateral screening digital breast
tomosynthesis was performed. The images were evaluated with
computer-aided detection.

[L MLO synth-2D]
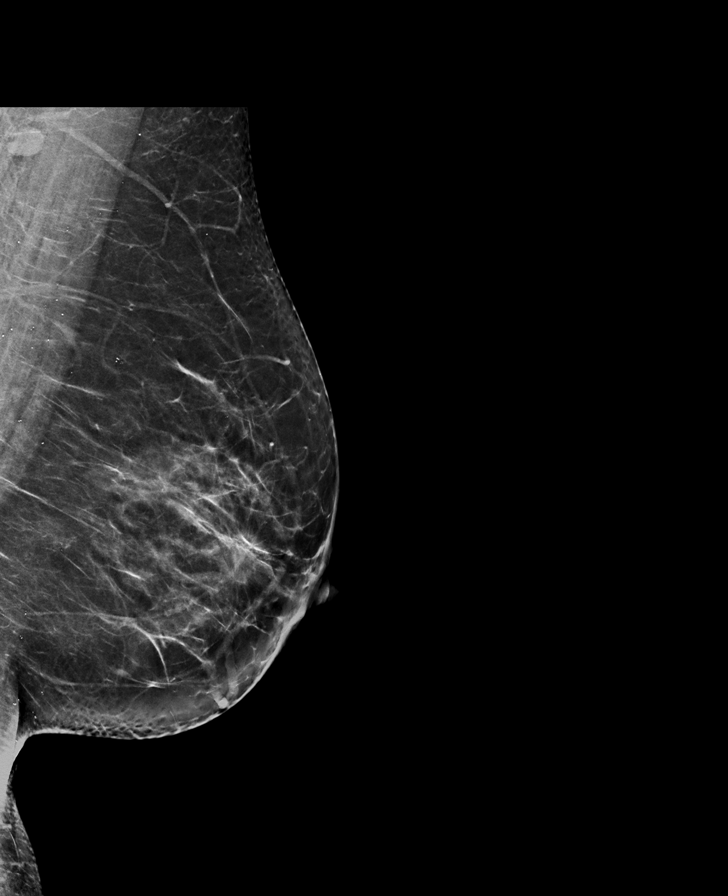

[L CC synth-2D]
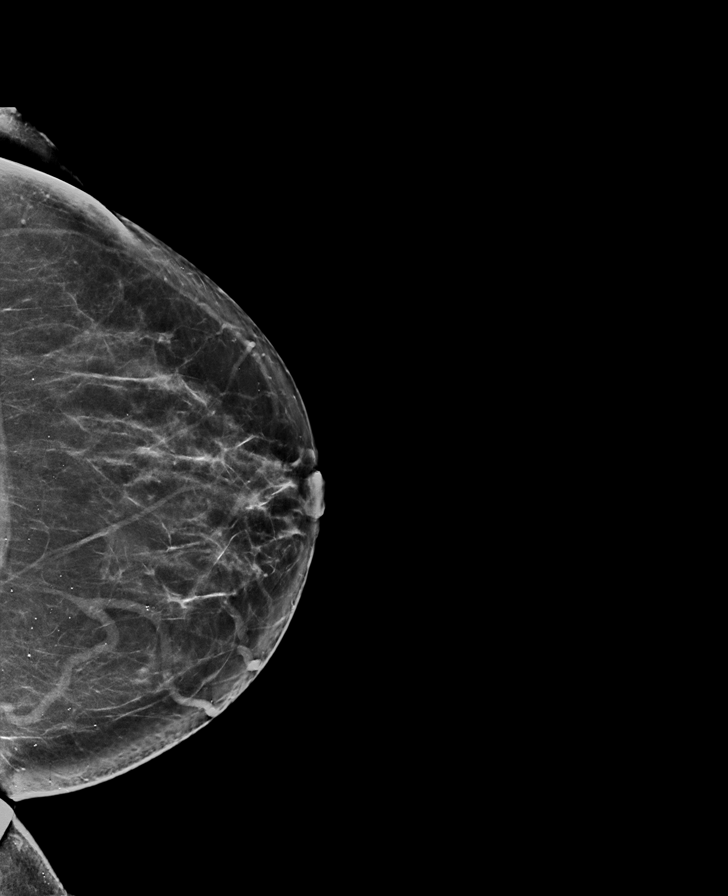

[R CC synth-2D]
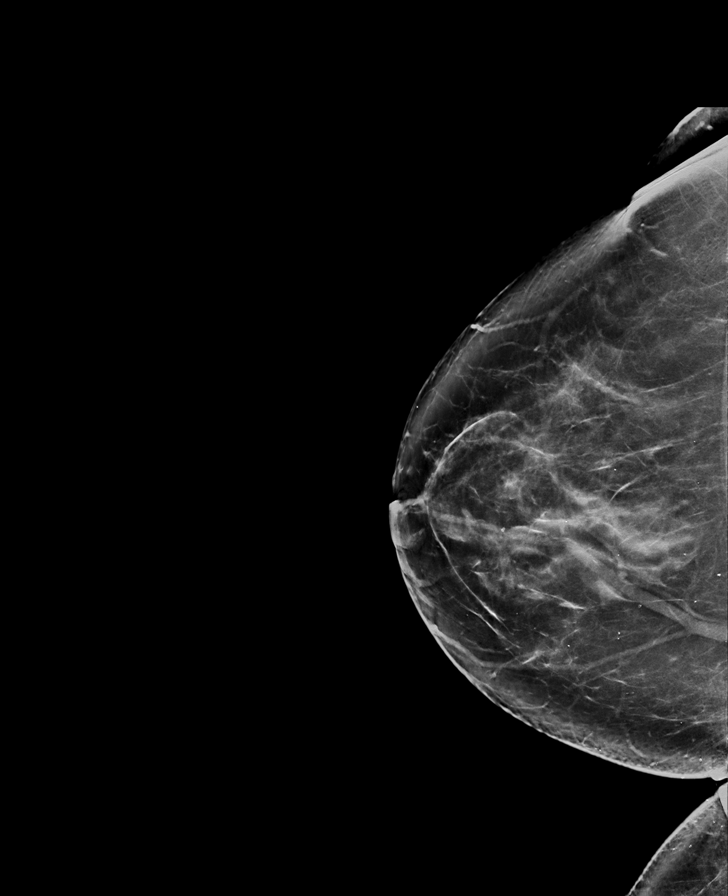

[R MLO synth-2D]
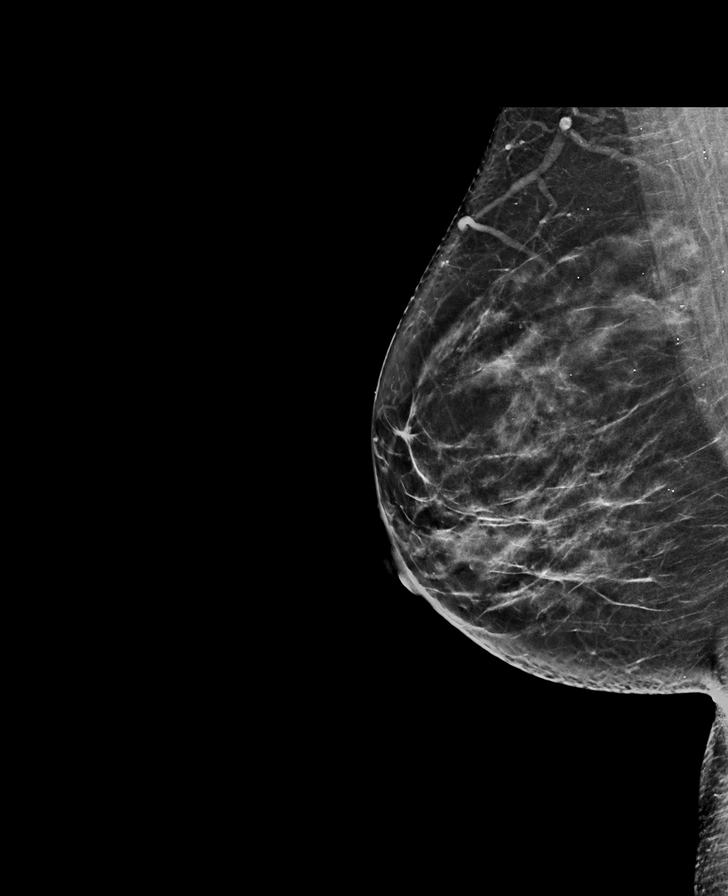

[L MLO tomo · tomo slice 39/78.0]
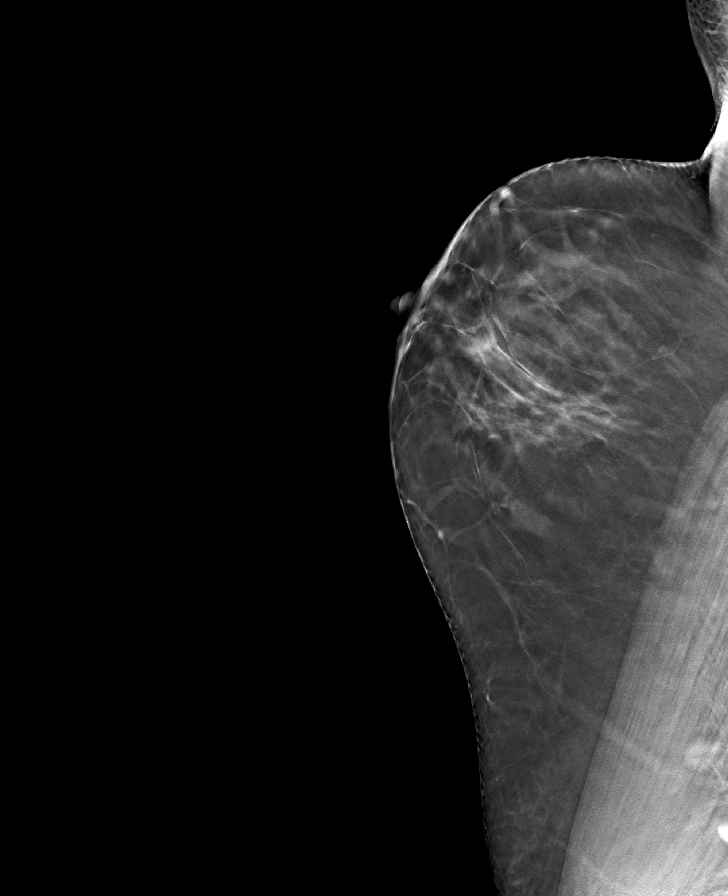

[R MLO tomo · tomo slice 35/70.0]
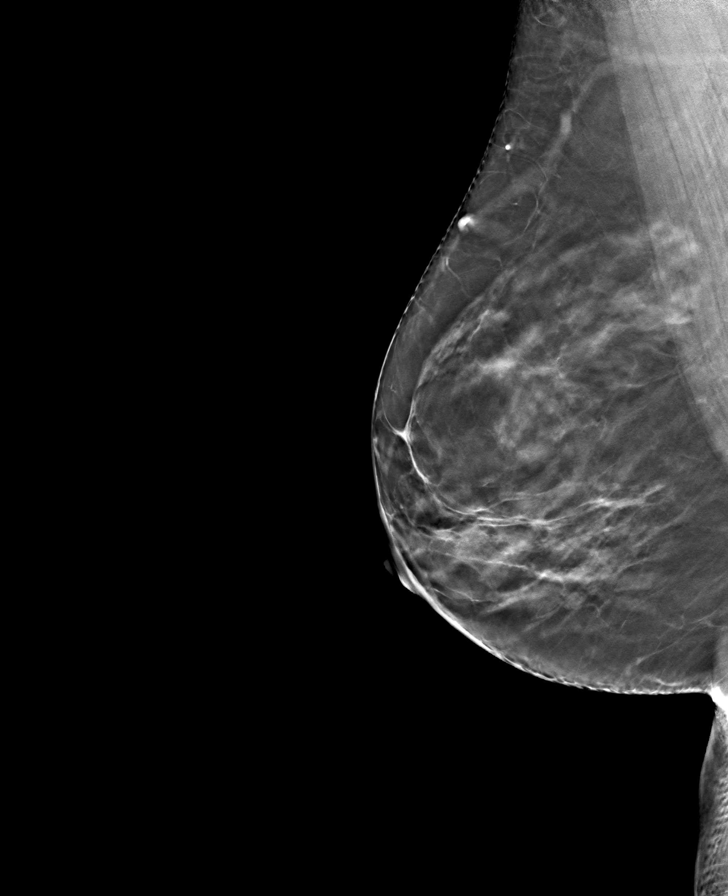

[L CC tomo · tomo slice 39/78.0]
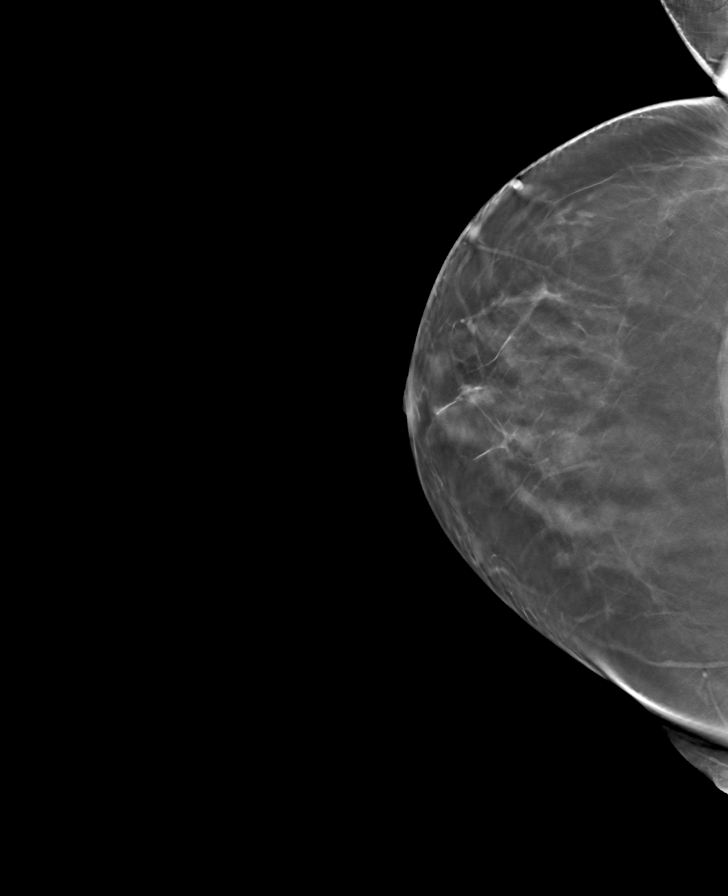

[R CC tomo · tomo slice 43/84.0]
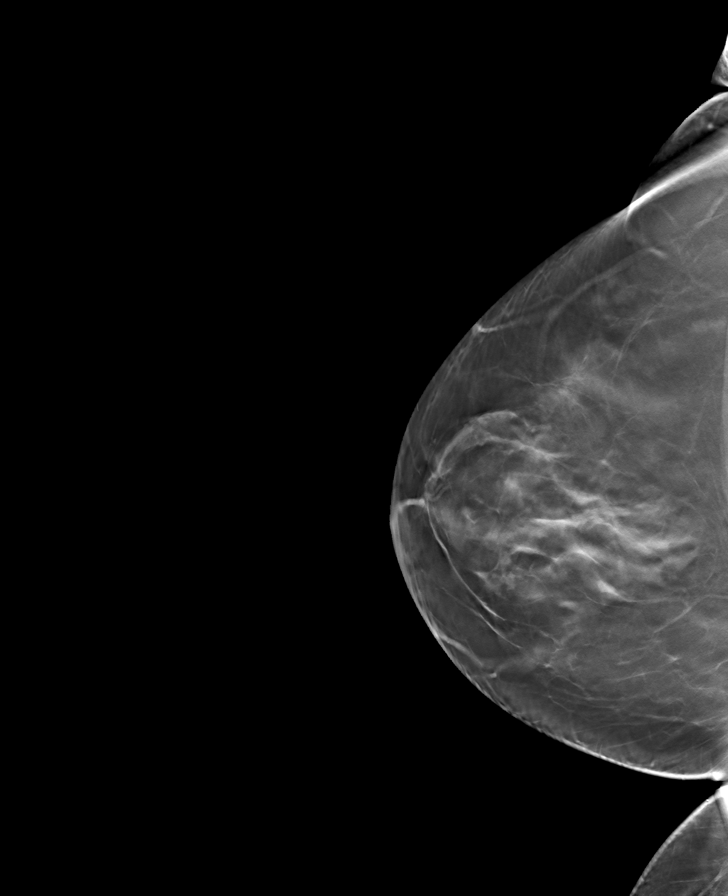

[8 of 24 positions shown; findings below may reference images not displayed]

ACR Breast Density Category c: The breast tissue is heterogeneously
dense, which may obscure small masses.
FINDINGS: There are no findings suspicious for malignancy. The images were
evaluated with computer-aided detection.
IMPRESSION: No mammographic evidence of malignancy. A result letter of this
screening mammogram will be mailed directly to the patient.

RECOMMENDATION:
Screening mammogram in one year. (Code:T4-5-GWO)

BI-RADS CATEGORY  1: Negative.

## 2022-10-11 ENCOUNTER — Encounter: Payer: Self-pay | Admitting: *Deleted

## 2022-10-11 ENCOUNTER — Ambulatory Visit: Payer: Self-pay | Admitting: *Deleted

## 2022-10-11 NOTE — Telephone Encounter (Addendum)
See triage noted regarding appt request and no sx now.   Patient's mother on DPR calling to schedule appt  Chief Complaint: patient's mother reports patient would like appt prior to training for marathon Symptoms: no c/o sx now, hx SOB and medication not working effectively at times. , hx anxiety and wants cardiac work up prior to training.  Frequency: na Pertinent Negatives: Patient denies sx now  Disposition: ED /[] Urgent Care (no appt availability in office) / Appointment(In office/virtual)/  [place] [facility]/ Home Care/ Refused Recommended Disposition /[] [place] [facility]/  Follow-up with PCP Additional Notes:   Requesting "work  up" , "calcium scan" prior to training for a marathon and has hx of cardiac issues in family. Patient's mother has markers for cardiac issues and just wants to be assessed. Requesting to review medications due to not as effective for episodes of SOB. Appt scheduled for 10/12/22.     Reason for Disposition  Requesting regular office appointment  Answer Assessment - Initial Assessment Questions 1. REASON FOR CALL or QUESTION: "What is your reason for calling today?" or "How can I best help you?" or "What question do you have that I can help answer?"     Patient's mother reports patient hx SOB takes medication and would like to start training for marathon and would like to get appt prior to training.  Protocols used: Information Only Call - No Triage-A-AH

## 2022-10-11 NOTE — Telephone Encounter (Signed)
Message from [facility] sent at 10/11/2022  8:56 AM EDT  Summary: experiencing SOB every day   Pt mother stated pt is experiencing SOB every day mentioned inhalers are not working. Mom stated that it is nothing out of the ordinary; PCP is aware of this; pt has mentioned she feels odd. Pt is not with her; is working, mom is on DPR and is requesting to schedule an appointment.  Seeking clinical advice.  Please return pt call directly.   [phone]          Call History   Type Contact Phone/Fax User  10/11/2022 08:51 AM EDT Phone (Incoming) Claudell Kyle (Mother) [phone] [place], Darlina Rumpf

## 2022-10-11 NOTE — Telephone Encounter (Signed)
This encounter was created in error - please disregard.

## 2022-10-11 NOTE — Telephone Encounter (Addendum)
Attempted to return the call to mother, Claudell Kyle (On DPR).   Left a voicemail to call back.  Also attempted to call pt directly at [phone] as her number was listed in the message.   Left a voicemail to call [facility] back.

## 2022-10-11 NOTE — Progress Notes (Unsigned)
I,J'ya E Ytzel Gubler,acting as a scribe for Jacky Kindle, FNP.,have documented all relevant documentation on the behalf of Jacky Kindle, FNP,as directed by  Jacky Kindle, FNP while in the presence of Jacky Kindle, FNP.   Established patient visit  Patient: Wendy [name]   DOB: 10/06/1975   47 y.o. Female  MRN: [id number] Visit Date: 10/12/2022  Today's healthcare provider: Jacky Kindle, FNP  Re Introduced to nurse practitioner role and practice setting.  All questions answered.  Discussed provider/patient relationship and expectations.  Subjective    HPI  Asthma: Patient presents for evaluation of chest tightness, dyspnea, non-productive cough, and wheezing.  The patient has been previously diagnosed with asthma. Symptoms currently include chest tightness, dyspnea, non-productive cough, and wheezing and occur continuously.  Observed precipitants include no identifiable factor.  Current limitations in activity from asthma: running, laying in bed.    Does she do nebulizer treatments? no Does she use an inhaler? Yes  Patient presents with concerns of wanting to start training for a marathon. She reports that her family has history of heart attacks and heart conditions and have concerns. She is wanting calcium checked.   Medications: Outpatient Medications Prior to Visit  Medication Sig   albuterol (VENTOLIN HFA) 108 (90 Base) MCG/ACT inhaler INHALE 2 PUFFS INTO THE LUNGS EVERY 4-6 HOURS AS NEEDED   amphetamine-dextroamphetamine (ADDERALL) 7.5 MG tablet TAKE 1 TABLET BY MOUTH TWICE DAILY   azelastine (ASTELIN) 0.1 % nasal spray Place 2 sprays into both nostrils 2 (two) times daily. Use in each nostril as directed   buPROPion (WELLBUTRIN XL) 300 MG 24 hr tablet TAKE ONE TABLET BY MOUTH DAILY   Cholecalciferol (VITAMIN D PO) Take 1 tablet by mouth daily.   clonazePAM (KLONOPIN) 0.5 MG tablet Take 1 tablet (0.5 mg total) by mouth daily as needed for anxiety.   COLLAGEN PO Take 2 Scoops by  mouth daily.   famotidine (PEPCID) 20 MG tablet Take 1 tablet (20 mg total) by mouth 2 (two) times daily.   MAGNESIUM CARBONATE PO Take 1 tablet by mouth daily.   predniSONE (DELTASONE) 50 MG tablet Take 1 tablet (50 mg total) by mouth daily with breakfast.   tretinoin (RETIN-A) 0.1 % cream Apply topically at bedtime. (Patient taking differently: Apply 1 application  topically 2 (two) times a week.)   fluticasone-salmeterol (ADVAIR) 100-50 MCG/ACT AEPB Inhale 1 puff into the lungs 2 (two) times daily. (Patient not taking: Reported on 10/12/2022)   No facility-administered medications prior to visit.    Review of Systems     Objective    BP 101/70 (BP Location: Left Arm, Patient Position: Sitting, Cuff Size: Normal)   Pulse 75   Temp 98.4 F (36.9 C) (Oral)   Ht  (1.6 m)   Wt 200 lb 8 oz (90.9 kg)   LMP 09/25/2022   SpO2 98%   BMI 35.52 kg/m    Physical Exam Vitals and nursing note reviewed.  Constitutional:      General: She is not in acute distress.    Appearance: Normal appearance. She is obese. She is not ill-appearing, toxic-appearing or diaphoretic.  HENT:     Head: Normocephalic and atraumatic.  Cardiovascular:     Rate and Rhythm: Normal rate and regular rhythm.     Pulses: Normal pulses.     Heart sounds: Normal heart sounds. No murmur heard.    No friction rub. No gallop.  Pulmonary:     Effort: Pulmonary  effort is normal. No respiratory distress.     Breath sounds: Normal breath sounds. No stridor. No wheezing, rhonchi or rales.  Chest:     Chest wall: No tenderness.  Abdominal:     General: Bowel sounds are normal.     Palpations: Abdomen is soft.  Musculoskeletal:        General: No swelling, tenderness, deformity or signs of injury. Normal range of motion.     Right lower leg: No edema.     Left lower leg: No edema.  Skin:    General: Skin is warm and dry.     Capillary Refill: Capillary refill takes less than 2 seconds.     Coloration: Skin is  not jaundiced or pale.     Findings: No bruising, erythema, lesion or rash.  Neurological:     General: No focal deficit present.     Mental Status: She is alert and oriented to person, place, and time. Mental status is at baseline.     Cranial Nerves: No cranial nerve deficit.     Sensory: No sensory deficit.     Motor: No weakness.     Coordination: Coordination normal.  Psychiatric:        Mood and Affect: Mood normal.        Behavior: Behavior normal.        Thought Content: Thought content normal.        Judgment: Judgment normal.     No results found for any visits on 10/12/22.  Assessment & Plan     Problem List Items Addressed This Visit       Respiratory   Mild persistent asthma with exacerbation    Reports current concerns for chest tightness; compliant with albuterol as needed. Reports poor side effects with advair. Normal air movement and RR today. Not in acute distress. Continues on Astelin nasal spray Request referral to pulmonary given ongoing concerns and need for PFTs        Other   Attention deficit hyperactivity disorder (ADHD), predominantly inattentive type    Chronic, stable Endorses chronic stressors/anxiety Maintains on adderall 7.5 mg BID       Family history of early CAD - Primary    Request for coronary CT scan given early family hx of CAD and need for stents Referral placed; BP stable Patient denies and CP or HTN/hypotension       Relevant Orders   CT CARDIAC SCORING (SELF PAY ONLY)   RESOLVED: Long COVID   Relevant Orders   Ambulatory referral to Pulmonology   Panic attacks    Chronic, stable Wishes to continue low dose klonopin as needed for periods of panic Also controlled with wellbutrin at 300 mg as controller medication       Severe episode of recurrent major depressive disorder, without psychotic features    Chronic, stable Denies SI or HI Wishes to continue on wellbutrin 300 mg       Return if symptoms worsen or fail to  improve.     Leilani Merl, FNP, have reviewed all documentation for this visit. The documentation on 10/12/22 for the exam, diagnosis, procedures, and orders are all accurate and complete.  Jacky Kindle, FNP  [facility] [phone] (phone) [phone] (fax)  [facility]

## 2022-10-12 ENCOUNTER — Ambulatory Visit (INDEPENDENT_AMBULATORY_CARE_PROVIDER_SITE_OTHER): Payer: 59 | Admitting: Family Medicine

## 2022-10-12 VITALS — BP 101/70 | HR 75 | Temp 98.4°F | Ht 63.0 in | Wt 200.5 lb

## 2022-10-12 DIAGNOSIS — Z8249 Family history of ischemic heart disease and other diseases of the circulatory system: Secondary | ICD-10-CM | POA: Diagnosis not present

## 2022-10-12 DIAGNOSIS — U099 Post covid-19 condition, unspecified: Secondary | ICD-10-CM

## 2022-10-12 DIAGNOSIS — J4521 Mild intermittent asthma with (acute) exacerbation: Secondary | ICD-10-CM

## 2022-10-12 DIAGNOSIS — F900 Attention-deficit hyperactivity disorder, predominantly inattentive type: Secondary | ICD-10-CM

## 2022-10-12 DIAGNOSIS — F410 Panic disorder [episodic paroxysmal anxiety] without agoraphobia: Secondary | ICD-10-CM

## 2022-10-12 DIAGNOSIS — F332 Major depressive disorder, recurrent severe without psychotic features: Secondary | ICD-10-CM | POA: Diagnosis not present

## 2022-10-12 NOTE — Assessment & Plan Note (Signed)
Request for coronary CT scan given early family hx of CAD and need for stents Referral placed; BP stable Patient denies and CP or HTN/hypotension

## 2022-10-12 NOTE — Assessment & Plan Note (Signed)
Chronic, stable Endorses chronic stressors/anxiety Maintains on adderall 7.5 mg BID

## 2022-10-12 NOTE — Assessment & Plan Note (Signed)
Chronic, stable Wishes to continue low dose klonopin as needed for periods of panic Also controlled with wellbutrin at 300 mg as controller medication

## 2022-10-12 NOTE — Assessment & Plan Note (Signed)
Reports current concerns for chest tightness; compliant with albuterol as needed. Reports poor side effects with advair. Normal air movement and RR today. Not in acute distress. Continues on Astelin nasal spray Request referral to pulmonary given ongoing concerns and need for PFTs

## 2022-10-12 NOTE — Assessment & Plan Note (Signed)
Chronic, stable Denies SI or HI Wishes to continue on wellbutrin 300 mg

## 2022-10-19 ENCOUNTER — Ambulatory Visit
Admission: RE | Admit: 2022-10-19 | Discharge: 2022-10-19 | Disposition: A | Discharge status: Home / Self Care | Payer: 59 | Source: Ambulatory Visit | Attending: Family Medicine | Admitting: Family Medicine

## 2022-10-19 DIAGNOSIS — Z8249 Family history of ischemic heart disease and other diseases of the circulatory system: Secondary | ICD-10-CM

## 2022-10-19 NOTE — Progress Notes (Signed)
Normal/negative calcium scoring. I continue to recommend diet low in saturated fat and regular exercise - 30 min at least 5 times per week

## 2022-11-02 ENCOUNTER — Ambulatory Visit: Payer: 59 | Admitting: Physician Assistant

## 2022-11-22 DIAGNOSIS — R070 Pain in throat: Secondary | ICD-10-CM | POA: Diagnosis not present

## 2022-11-22 DIAGNOSIS — J309 Allergic rhinitis, unspecified: Secondary | ICD-10-CM | POA: Diagnosis not present

## 2022-11-29 DIAGNOSIS — J301 Allergic rhinitis due to pollen: Secondary | ICD-10-CM | POA: Diagnosis not present

## 2022-12-05 DIAGNOSIS — M5412 Radiculopathy, cervical region: Secondary | ICD-10-CM | POA: Diagnosis not present

## 2022-12-05 DIAGNOSIS — M9901 Segmental and somatic dysfunction of cervical region: Secondary | ICD-10-CM | POA: Diagnosis not present

## 2022-12-05 DIAGNOSIS — M9905 Segmental and somatic dysfunction of pelvic region: Secondary | ICD-10-CM | POA: Diagnosis not present

## 2022-12-05 DIAGNOSIS — M7918 Myalgia, other site: Secondary | ICD-10-CM | POA: Diagnosis not present

## 2022-12-05 DIAGNOSIS — M9903 Segmental and somatic dysfunction of lumbar region: Secondary | ICD-10-CM | POA: Diagnosis not present

## 2022-12-05 DIAGNOSIS — M9904 Segmental and somatic dysfunction of sacral region: Secondary | ICD-10-CM | POA: Diagnosis not present

## 2022-12-05 DIAGNOSIS — M542 Cervicalgia: Secondary | ICD-10-CM | POA: Diagnosis not present

## 2022-12-05 DIAGNOSIS — M25632 Stiffness of left wrist, not elsewhere classified: Secondary | ICD-10-CM | POA: Diagnosis not present

## 2022-12-05 DIAGNOSIS — M25631 Stiffness of right wrist, not elsewhere classified: Secondary | ICD-10-CM | POA: Diagnosis not present

## 2022-12-05 DIAGNOSIS — M545 Low back pain, unspecified: Secondary | ICD-10-CM | POA: Diagnosis not present

## 2022-12-05 DIAGNOSIS — M9902 Segmental and somatic dysfunction of thoracic region: Secondary | ICD-10-CM | POA: Diagnosis not present

## 2022-12-05 DIAGNOSIS — M546 Pain in thoracic spine: Secondary | ICD-10-CM | POA: Diagnosis not present

## 2022-12-12 DIAGNOSIS — M9902 Segmental and somatic dysfunction of thoracic region: Secondary | ICD-10-CM | POA: Diagnosis not present

## 2022-12-12 DIAGNOSIS — M7918 Myalgia, other site: Secondary | ICD-10-CM | POA: Diagnosis not present

## 2022-12-12 DIAGNOSIS — M25632 Stiffness of left wrist, not elsewhere classified: Secondary | ICD-10-CM | POA: Diagnosis not present

## 2022-12-12 DIAGNOSIS — M9905 Segmental and somatic dysfunction of pelvic region: Secondary | ICD-10-CM | POA: Diagnosis not present

## 2022-12-12 DIAGNOSIS — M546 Pain in thoracic spine: Secondary | ICD-10-CM | POA: Diagnosis not present

## 2022-12-12 DIAGNOSIS — M9903 Segmental and somatic dysfunction of lumbar region: Secondary | ICD-10-CM | POA: Diagnosis not present

## 2022-12-12 DIAGNOSIS — M5412 Radiculopathy, cervical region: Secondary | ICD-10-CM | POA: Diagnosis not present

## 2022-12-12 DIAGNOSIS — M25631 Stiffness of right wrist, not elsewhere classified: Secondary | ICD-10-CM | POA: Diagnosis not present

## 2022-12-12 DIAGNOSIS — M9904 Segmental and somatic dysfunction of sacral region: Secondary | ICD-10-CM | POA: Diagnosis not present

## 2022-12-12 DIAGNOSIS — M545 Low back pain, unspecified: Secondary | ICD-10-CM | POA: Diagnosis not present

## 2022-12-12 DIAGNOSIS — M9901 Segmental and somatic dysfunction of cervical region: Secondary | ICD-10-CM | POA: Diagnosis not present

## 2022-12-12 DIAGNOSIS — M542 Cervicalgia: Secondary | ICD-10-CM | POA: Diagnosis not present

## 2022-12-28 DIAGNOSIS — M5412 Radiculopathy, cervical region: Secondary | ICD-10-CM | POA: Diagnosis not present

## 2022-12-28 DIAGNOSIS — M546 Pain in thoracic spine: Secondary | ICD-10-CM | POA: Diagnosis not present

## 2022-12-28 DIAGNOSIS — M545 Low back pain, unspecified: Secondary | ICD-10-CM | POA: Diagnosis not present

## 2022-12-28 DIAGNOSIS — M9901 Segmental and somatic dysfunction of cervical region: Secondary | ICD-10-CM | POA: Diagnosis not present

## 2022-12-28 DIAGNOSIS — M7918 Myalgia, other site: Secondary | ICD-10-CM | POA: Diagnosis not present

## 2022-12-28 DIAGNOSIS — M9902 Segmental and somatic dysfunction of thoracic region: Secondary | ICD-10-CM | POA: Diagnosis not present

## 2022-12-28 DIAGNOSIS — M9905 Segmental and somatic dysfunction of pelvic region: Secondary | ICD-10-CM | POA: Diagnosis not present

## 2022-12-28 DIAGNOSIS — M542 Cervicalgia: Secondary | ICD-10-CM | POA: Diagnosis not present

## 2022-12-28 DIAGNOSIS — M9904 Segmental and somatic dysfunction of sacral region: Secondary | ICD-10-CM | POA: Diagnosis not present

## 2022-12-28 DIAGNOSIS — M9903 Segmental and somatic dysfunction of lumbar region: Secondary | ICD-10-CM | POA: Diagnosis not present

## 2023-02-02 ENCOUNTER — Other Ambulatory Visit: Payer: Self-pay | Admitting: Family Medicine

## 2023-02-02 ENCOUNTER — Telehealth: Payer: Self-pay | Admitting: Family Medicine

## 2023-02-02 DIAGNOSIS — F900 Attention-deficit hyperactivity disorder, predominantly inattentive type: Secondary | ICD-10-CM

## 2023-02-02 NOTE — Telephone Encounter (Signed)
Medication Refill - Medication:  amphetamine-dextroamphetamine (ADDERALL) 7.5 MG tablet completely out   Has the patient contacted their pharmacy? Yes.   [facility] pharmacy was faxing over a request and wanted to make PCP was aware   Preferred Pharmacy (with phone number or street name):  [facility] - [place], [place] - [address] Phone: [phone] Fax: [phone]    Has the patient been seen for an appointment in the last year OR does the patient have an upcoming appointment? Yes.   Last seen on 10/12/22

## 2023-02-03 NOTE — Telephone Encounter (Signed)
Requested medication (s) are due for refill today: yes  Requested medication (s) are on the active medication list: yes Last refill:  09/14/22 #60  Future visit scheduled: no  Notes to clinic:  med not delegated to NT to RF   Requested Prescriptions  Pending Prescriptions Disp Refills   amphetamine-dextroamphetamine (ADDERALL) 7.5 MG tablet [Pharmacy Med Name: AMPHETAMINE-DEXTROAMPHETAMINE 7.5 M] 60 tablet     Sig: TAKE 1 TABLET BY MOUTH TWICE DAILY     Not Delegated - Psychiatry:  Stimulants/ADHD Failed - 02/02/2023 11:03 AM      Failed - This refill cannot be delegated      Failed - Urine Drug Screen completed in last 360 days      Passed - Last BP in normal range    BP Readings from Last 1 Encounters:  10/12/22 101/70         Passed - Last Heart Rate in normal range    Pulse Readings from Last 1 Encounters:  10/12/22 75         Passed - Valid encounter within last 6 months    Recent Outpatient Visits           3 months ago Family history of early CAD   [place] [facility] Jacky Kindle, FNP   5 months ago Long COVID   [facility] Merita Norton T, FNP   11 months ago Severe episode of recurrent major depressive disorder, without psychotic features [facility])   [place] [facility] Merita Norton T, FNP   1 year ago Severe episode of recurrent major depressive disorder, without psychotic features [facility])   [place] [facility] Merita Norton T, FNP   1 year ago Menorrhagia with irregular cycle   [facility] Jacky Kindle, [place]

## 2023-02-28 ENCOUNTER — Other Ambulatory Visit: Payer: Self-pay | Admitting: Family Medicine

## 2023-02-28 DIAGNOSIS — F410 Panic disorder [episodic paroxysmal anxiety] without agoraphobia: Secondary | ICD-10-CM

## 2023-04-04 ENCOUNTER — Other Ambulatory Visit: Payer: Self-pay | Admitting: Family Medicine

## 2023-04-04 DIAGNOSIS — F900 Attention-deficit hyperactivity disorder, predominantly inattentive type: Secondary | ICD-10-CM

## 2023-04-04 DIAGNOSIS — F410 Panic disorder [episodic paroxysmal anxiety] without agoraphobia: Secondary | ICD-10-CM

## 2023-04-05 NOTE — Telephone Encounter (Signed)
Requested medication (s) are due for refill today: yes  Requested medication (s) are on the active medication list: yes    Last refill: Clonazepam  03/01/23  #30  0 refills  Amphetamine  02/03/23 #60  0 refills  Future visit scheduled no  Notes to clinic:  Not delegated, please review. Thank you.  Requested Prescriptions  Pending Prescriptions Disp Refills   clonazePAM (KLONOPIN) 0.5 MG tablet [Pharmacy Med Name: CLONAZEPAM 0.5 MG TAB] 30 tablet     Sig: TAKE ONE TABLET BY MOUTH EVERY DAY AS NEEDED FOR ANXIETY     Not Delegated - Psychiatry: Anxiolytics/Hypnotics 2 Failed - 04/04/2023  4:33 PM      Failed - This refill cannot be delegated      Failed - Urine Drug Screen completed in last 360 days      Passed - Patient is not pregnant      Passed - Valid encounter within last 6 months    Recent Outpatient Visits           5 months ago Family history of early CAD   [place] [facility] Jacky Kindle, FNP   7 months ago Long COVID   [facility] Merita Norton T, FNP   1 year ago Severe episode of recurrent major depressive disorder, without psychotic features [facility])   [place] [facility] Merita Norton T, FNP   1 year ago Severe episode of recurrent major depressive disorder, without psychotic features [facility])   [place] [facility] Merita Norton T, FNP   1 year ago Menorrhagia with irregular cycle   [facility] Merita Norton T, FNP               amphetamine-dextroamphetamine (ADDERALL) 7.5 MG tablet [Pharmacy Med Name: AMPHETAMINE-DEXTROAMPHETAMINE 7.5 M] 60 tablet     Sig: TAKE 1 TABLET BY MOUTH TWICE DAILY     Not Delegated - Psychiatry:  Stimulants/ADHD Failed - 04/04/2023  4:33 PM      Failed - This refill cannot be delegated      Failed - Urine Drug Screen completed in last 360 days      Passed - Last BP in normal range    BP Readings from Last 1 Encounters:   10/12/22 101/70         Passed - Last Heart Rate in normal range    Pulse Readings from Last 1 Encounters:  10/12/22 75         Passed - Valid encounter within last 6 months    Recent Outpatient Visits           5 months ago Family history of early CAD   [facility] Jacky Kindle, FNP   7 months ago Long COVID   [facility] Merita Norton T, FNP   1 year ago Severe episode of recurrent major depressive disorder, without psychotic features [facility])   [place] [facility] Merita Norton T, FNP   1 year ago Severe episode of recurrent major depressive disorder, without psychotic features [facility])   [facility] Merita Norton T, FNP   1 year ago Menorrhagia with irregular cycle   [facility] Jacky Kindle, [place]

## 2023-05-09 ENCOUNTER — Encounter: Payer: Self-pay | Admitting: *Deleted

## 2023-05-09 ENCOUNTER — Other Ambulatory Visit: Payer: Self-pay | Admitting: Family Medicine

## 2023-05-09 DIAGNOSIS — F900 Attention-deficit hyperactivity disorder, predominantly inattentive type: Secondary | ICD-10-CM

## 2023-05-09 DIAGNOSIS — F410 Panic disorder [episodic paroxysmal anxiety] without agoraphobia: Secondary | ICD-10-CM

## 2023-05-09 NOTE — Telephone Encounter (Signed)
Please adivise?

## 2023-05-09 NOTE — Telephone Encounter (Signed)
Sent mychart notice for her to schedule an appt.

## 2023-05-09 NOTE — Telephone Encounter (Signed)
Unable to LVM--but will send mychart message.

## 2023-05-30 ENCOUNTER — Encounter: Payer: Self-pay | Admitting: Family Medicine

## 2023-05-30 ENCOUNTER — Ambulatory Visit (INDEPENDENT_AMBULATORY_CARE_PROVIDER_SITE_OTHER): Payer: 59 | Admitting: Family Medicine

## 2023-05-30 VITALS — BP 118/84 | HR 76 | Ht 63.0 in | Wt 202.0 lb

## 2023-05-30 DIAGNOSIS — F4321 Adjustment disorder with depressed mood: Secondary | ICD-10-CM | POA: Insufficient documentation

## 2023-05-30 DIAGNOSIS — F418 Other specified anxiety disorders: Secondary | ICD-10-CM | POA: Diagnosis not present

## 2023-05-30 MED ORDER — BUPROPION HCL ER (XL) 150 MG PO TB24: 150.0000 mg | ORAL_TABLET | Freq: Every day | ORAL | 0 refills | Status: DC

## 2023-05-30 NOTE — Assessment & Plan Note (Signed)
Reports daughter was in life threatening accident and patient has been out of work x 3 weeks given ongoing ICU care and acute inpatient rehab out of town Has since restarted previous dose of 1/2 mg of her wellbutrin and wishes to continue

## 2023-05-30 NOTE — Assessment & Plan Note (Signed)
Acute on chronic, worsening given situation Reports ongoing grief and anxiety to assist with daughter and her prognosis Reports she has relief from her ex spouse and her mother to assist with daughter and upcoming plans to outfit house and prepare for d/c from [place] in w/c     05/30/2023    9:56 AM 08/24/2022    9:43 AM 02/10/2022    8:10 AM  GAD 7 : Generalized Anxiety Score  Nervous, Anxious, on Edge 3 3 1   Control/stop worrying 3 2 1   Worry too much - different things 1 2 1   Trouble relaxing 2 0 1  Restless 1 3 1   Easily annoyed or irritable 1 3 0  Afraid - awful might happen 1 1 1   Total GAD 7 Score 12 14 6   Anxiety Difficulty  Not difficult at all Somewhat difficult
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
## 2023-08-28 ENCOUNTER — Other Ambulatory Visit: Payer: Self-pay | Admitting: Family Medicine

## 2023-08-28 DIAGNOSIS — F900 Attention-deficit hyperactivity disorder, predominantly inattentive type: Secondary | ICD-10-CM

## 2023-08-28 DIAGNOSIS — F410 Panic disorder [episodic paroxysmal anxiety] without agoraphobia: Secondary | ICD-10-CM

## 2023-08-28 NOTE — Telephone Encounter (Signed)
 [facility] faxed refill request for the following medications:   amphetamine-dextroamphetamine (ADDERALL) 7.5 MG tablet    clonazePAM (KLONOPIN) 0.5 MG tablet     Please advise.

## 2023-08-29 MED ORDER — CLONAZEPAM 0.5 MG PO TABS
0.5000 mg | ORAL_TABLET | Freq: Every day | ORAL | 0 refills | Status: DC | PRN
Start: 2023-08-29 — End: 2023-11-10

## 2023-08-29 MED ORDER — AMPHETAMINE-DEXTROAMPHETAMINE 7.5 MG PO TABS: 1.0000 | ORAL_TABLET | Freq: Two times a day (BID) | ORAL | 0 refills | Status: DC

## 2023-08-31 ENCOUNTER — Telehealth: Payer: Self-pay

## 2023-08-31 DIAGNOSIS — F900 Attention-deficit hyperactivity disorder, predominantly inattentive type: Secondary | ICD-10-CM

## 2023-08-31 MED ORDER — AMPHETAMINE-DEXTROAMPHETAMINE 7.5 MG PO TABS: 1.0000 | ORAL_TABLET | Freq: Two times a day (BID) | ORAL | 0 refills | Status: DC

## 2023-08-31 NOTE — Addendum Note (Signed)
 Addended byJacquenette Shone on: 08/31/2023 05:28 PM   Modules accepted: Orders

## 2023-08-31 NOTE — Telephone Encounter (Signed)
 Copied from CRM [id number]. Topic: Clinical - Prescription Issue >> Aug 30, 2023  3:47 PM Fredrich Romans wrote: Reason for CRM: Patient was suppose to have medication amphetamine-dextroamphetamine (ADDERALL) 7.5 MG tablet sent to  [facility] - [place], [place] - [address]  Phone: [phone] Fax: [phone]   [facility] of [facility]. [facility]'t [facility] and they have medication on back order.

## 2023-10-09 ENCOUNTER — Other Ambulatory Visit: Payer: Self-pay | Admitting: Family Medicine

## 2023-10-09 NOTE — Telephone Encounter (Signed)
 Copied from CRM [id number]. Topic: Clinical - Medication Refill >> Oct 09, 2023  8:49 AM Leory Rands wrote: Most Recent Primary Care Visit:  Provider: PAYNE, ELISE T  Department: [id number]  Visit Type: OFFICE VISIT  Date: 05/30/2023  Medication: buPROPion  (WELLBUTRIN  XL) 150 MG 24 hr tablet [[id number]]  Has the patient contacted their pharmacy? Yes (Agent: If no, request that the patient contact the pharmacy for the refill. If patient does not wish to contact the pharmacy document the reason why and proceed with request.) (Agent: If yes, when and what did the pharmacy advise?)  Is this the correct pharmacy for this prescription? Yes If no, delete pharmacy and type the correct one.  This is the patient's preferred pharmacy:  [facility] - [place], [place] - [address] [place] [address] Phone: [phone] Fax: [phone]   Has the prescription been filled recently? Yes  Is the patient out of the medication? Yes  Has the patient been seen for an appointment in the last year OR does the patient have an upcoming appointment? Yes  Can we respond through MyChart? Yes  Agent: Please be advised that Rx refills may take up to 3 business days. We ask that you follow-up with your pharmacy.

## 2023-10-10 MED ORDER — BUPROPION HCL ER (XL) 150 MG PO TB24
150.0000 mg | ORAL_TABLET | Freq: Every day | ORAL | 0 refills | Status: DC
Start: 2023-10-10 — End: 2023-11-10

## 2023-10-10 NOTE — Telephone Encounter (Signed)
 Requested medication (s) are due for refill today: yes  Requested medication (s) are on the active medication list: yes  Last refill:  05/30/23  Future visit scheduled: no  Notes to clinic:  Unable to refill per protocol, last refill by another provider no longer at this practice.     Requested Prescriptions  Pending Prescriptions Disp Refills   buPROPion  (WELLBUTRIN  XL) 150 MG 24 hr tablet 90 tablet 0    Sig: Take 1 tablet (150 mg total) by mouth daily.     Psychiatry: Antidepressants - bupropion  Failed - 10/10/2023  8:29 AM      Failed - Cr in normal range and within 360 days    Creat  Date Value Ref Range Status  09/05/2013 0.62 0.50 - 1.10 mg/dL Final   Creatinine, Ser  Date Value Ref Range Status  08/24/2022 0.62 0.57 - 1.00 mg/dL Final         Failed - AST in normal range and within 360 days    AST  Date Value Ref Range Status  08/24/2022 12 0 - 40 IU/L Final         Failed - ALT in normal range and within 360 days    ALT  Date Value Ref Range Status  08/24/2022 13 0 - 32 IU/L Final         Failed - Valid encounter within last 6 months    Recent Outpatient Visits   None            Passed - Completed PHQ-2 or PHQ-9 in the last 360 days      Passed - Last BP in normal range    BP Readings from Last 1 Encounters:  05/30/23 118/84

## 2023-10-23 ENCOUNTER — Ambulatory Visit
Admission: EM | Admit: 2023-10-23 | Discharge: 2023-10-23 | Disposition: A | Discharge status: Home / Self Care | Attending: Emergency Medicine | Admitting: Emergency Medicine

## 2023-10-23 DIAGNOSIS — J069 Acute upper respiratory infection, unspecified: Secondary | ICD-10-CM | POA: Diagnosis not present

## 2023-10-23 MED ORDER — AMOXICILLIN-POT CLAVULANATE 875-125 MG PO TABS: 1.0000 | ORAL_TABLET | Freq: Two times a day (BID) | ORAL | 0 refills | Status: DC

## 2023-10-23 MED ORDER — PREDNISONE 10 MG (21) PO TBPK: ORAL_TABLET | Freq: Every day | ORAL | 0 refills | Status: DC

## 2023-10-23 MED ORDER — BENZONATATE 100 MG PO CAPS: 100.0000 mg | ORAL_CAPSULE | Freq: Three times a day (TID) | ORAL | 0 refills | Status: DC

## 2023-10-23 MED ORDER — PROMETHAZINE-DM 6.25-15 MG/5ML PO SYRP: 5.0000 mL | ORAL_SOLUTION | Freq: Every evening | ORAL | 0 refills | Status: DC | PRN

## 2023-10-23 NOTE — ED Provider Notes (Signed)
 Wendy [name]    CSN: [id number] Arrival date & time: 10/23/23  1219      History   Chief Complaint Chief Complaint  Patient presents with   Cough   Shortness of Breath    HPI Wendy [name] is a 48 y.o. female.   Patient presents for evaluation of nasal congestion, rhinorrhea, primarily nonproductive cough, shortness of breath with exertion, intermittent sinus pressure across the cheeks, intermittent bilateral ear fullness present for 2 weeks.  Symptoms progressively worsening, what started as a head cold and now feels more chest related.  Congestion has improved.  Denies presence of wheezing.  History of asthma, has attempted inhaler which has been helpful.  Additionally attempted TheraFlu.  Symptoms began after recent travel from [place] but no known sick contacts.  Tolerating food and liquids but appetite is decreased.  Past Medical History:  Diagnosis Date   Abnormal Pap smear of cervix 10/19/2011   ADHD (attention deficit hyperactivity disorder)    Allergy    Anxiety    Asthma    Chlamydia    Depression    GERD (gastroesophageal reflux disease)    occ no meds   Headache    migraines   Heart murmur    slight    Patient Active Problem List   Diagnosis Date Noted   Situational anxiety 05/30/2023   Grief reaction 05/30/2023   Family history of early CAD 10/12/2022   Severe episode of recurrent major depressive disorder, without psychotic features (HCC) 10/12/2022   Mild persistent asthma with exacerbation 08/24/2022   Panic attacks 08/24/2022   Screening for colon cancer 08/24/2022   Palpitations 08/24/2022   Screening for thyroid  disorder 08/24/2022   Screening for diabetes mellitus 08/24/2022   Obesity (BMI 35.0-39.9 without comorbidity) 11/29/2021   Attention deficit hyperactivity disorder (ADHD), predominantly inattentive type 05/18/2021   Menstrual migraine 11/05/2015    Past Surgical History:  Procedure Laterality Date   CESAREAN SECTION  2004    x1   DILITATION & CURRETTAGE/HYSTROSCOPY WITH NOVASURE ABLATION N/A 07/29/2021   Procedure: FRACTIONAL DILATATION & CURETTAGE/HYSTEROSCOPY WITH NOVASURE ABLATION/MYOSURE RESECTION OF POLYP;  Surgeon: Carolynn Citrin, MD;  Location: [facility];  Service: Gynecology;  Laterality: N/A;   LAPAROSCOPIC TUBAL LIGATION  05/02/2012   Procedure: LAPAROSCOPIC TUBAL LIGATION;  Surgeon: Ana Balling, MD;  Location: [facility];  Service: Gynecology;  Laterality: Bilateral;  with filshie clips   TUBAL LIGATION  04/2012   WISDOM TOOTH EXTRACTION     x4   WRIST SURGERY     rigth cyst removal.    OB History     Gravida  2   Para  2   Term      Preterm      AB      Living  2      SAB      IAB      Ectopic      Multiple      Live Births  2            Home Medications    Prior to Admission medications   Medication Sig Start Date End Date Taking? Authorizing Provider  amoxicillin -clavulanate (AUGMENTIN ) 875-125 MG tablet Take 1 tablet by mouth every 12 (twelve) hours. 10/23/23  Yes Lilliona Blakeney R, NP  benzonatate  (TESSALON ) 100 MG capsule Take 1 capsule (100 mg total) by mouth every 8 (eight) hours. 10/23/23  Yes Alie Hardgrove R, NP  predniSONE  (STERAPRED UNI-PAK 21 TAB) 10 MG (21) TBPK  tablet Take by mouth daily. Take 6 tabs by mouth daily  for 1 days, then 5 tabs for 1 days, then 4 tabs for 1 days, then 3 tabs for 1 days, 2 tabs for 1 days, then 1 tab by mouth daily for 1 days 10/23/23  Yes Deshannon Seide R, NP  promethazine-dextromethorphan (PROMETHAZINE-DM) 6.25-15 MG/5ML syrup Take 5 mLs by mouth at bedtime as needed. 10/23/23  Yes Jeanene Mena, Nerissa Bannister R, NP  albuterol (VENTOLIN HFA) 108 (90 Base) MCG/ACT inhaler INHALE 2 PUFFS INTO THE LUNGS EVERY 4-6 HOURS AS NEEDED 07/22/21   Iona Manis T, FNP  amphetamine -dextroamphetamine  (ADDERALL) 7.5 MG tablet Take 1 tablet by mouth 2 (two) times daily. 08/31/23   Carlean Charter, DO  azelastine  (ASTELIN ) 0.1 % nasal spray Place 2 sprays into both  nostrils 2 (two) times daily. Use in each nostril as directed 08/24/22   Iona Manis T, FNP  buPROPion  (WELLBUTRIN  XL) 150 MG 24 hr tablet Take 1 tablet (150 mg total) by mouth daily. 10/10/23   Bacigalupo, Angela M, MD  Cholecalciferol (VITAMIN D PO) Take 1 tablet by mouth daily.    [provider]  clonazePAM  (KLONOPIN ) 0.5 MG tablet Take 1 tablet (0.5 mg total) by mouth daily as needed for anxiety. 08/29/23   Carlean Charter, DO  COLLAGEN PO Take 2 Scoops by mouth daily.    [provider]  famotidine  (PEPCID ) 20 MG tablet Take 1 tablet (20 mg total) by mouth 2 (two) times daily. 08/24/22   Normie Becton, FNP  MAGNESIUM CARBONATE PO Take 1 tablet by mouth daily.    [provider]  tretinoin  (RETIN-A ) 0.1 % cream Apply topically at bedtime. Patient taking differently: Apply 1 application  topically 2 (two) times a week. 06/22/20   Mazie Speed, MD    Family History Family History  Problem Relation Age of Onset   Arthritis Mother    Hypothyroidism Mother    Heart attack Mother    Heart attack Father    Heart disease Father        heart attack   Alcohol abuse Father    Heart disease Maternal Grandmother    Emphysema Maternal Grandfather    Heart disease Maternal Grandfather    Asthma Paternal Grandmother    Heart disease Paternal Grandfather    Breast cancer Neg Hx     Social History Social History   Tobacco Use   Smoking status: Never   Smokeless tobacco: Never  Vaping Use   Vaping status: Never Used  Substance Use Topics   Alcohol use: Yes    Comment: occasional/rare   Drug use: No     Allergies   Peanut (diagnostic), Peanuts [peanut oil], Banana, Other, Codeine, Egg-derived products, Latex, and Vicodin [hydrocodone-acetaminophen ]   Review of Systems Review of Systems   Physical Exam Triage Vital Signs ED Triage Vitals  Encounter Vitals Group     BP 10/23/23 1231 (!) 147/78     Systolic BP Percentile --      Diastolic BP  Percentile --      Pulse Rate 10/23/23 1231 72     Resp 10/23/23 1231 16     Temp 10/23/23 1231 97.7 F (36.5 C)     Temp Source 10/23/23 1231 Temporal     SpO2 10/23/23 1231 98 %     Weight --      Height --      Head Circumference --      Peak Flow --  Pain Score 10/23/23 1225 0     Pain Loc --      Pain Education --      Exclude from Growth Chart --    No data found.  Updated Vital Signs BP (!) 147/78 (BP Location: Right Arm)   Pulse 72   Temp 97.7 F (36.5 C) (Temporal)   Resp 16   SpO2 98%   Visual Acuity Right Eye Distance:   Left Eye Distance:   Bilateral Distance:    Right Eye Near:   Left Eye Near:    Bilateral Near:     Physical Exam Constitutional:      Appearance: Normal appearance. She is well-developed.  HENT:     Head: Normocephalic.     Right Ear: Tympanic membrane, ear canal and external ear normal.     Left Ear: Tympanic membrane, ear canal and external ear normal.     Nose: Congestion present.     Mouth/Throat:     Pharynx: No oropharyngeal exudate or posterior oropharyngeal erythema.  Eyes:     Extraocular Movements: Extraocular movements intact.  Cardiovascular:     Rate and Rhythm: Normal rate and regular rhythm.     Pulses: Normal pulses.     Heart sounds: Normal heart sounds.  Pulmonary:     Effort: Pulmonary effort is normal.     Breath sounds: Normal breath sounds.  Musculoskeletal:     Cervical back: Normal range of motion and neck supple.  Neurological:     Mental Status: She is alert and oriented to person, place, and time. Mental status is at baseline.      UC Treatments / Results  Labs (all labs ordered are listed, but only abnormal results are displayed) Labs Reviewed - No data to display  EKG   Radiology No results found.  Procedures Procedures (including critical care time)  Medications Ordered in UC Medications - No data to display  Initial Impression / Assessment and Plan / UC Course  I have  reviewed the triage vital signs and the nursing notes.  Pertinent labs & imaging results that were available during my care of the patient were reviewed by me and considered in my medical decision making (see chart for details).  Acute URI  Patient is in no signs of distress nor toxic appearing.  Vital signs are stable.  Low suspicion for pneumonia, pneumothorax or bronchitis and therefore will defer imaging.  Viral testing deferred due to timeline of illness, symptoms present for 2 weeks therefore we will provide bacterial coverage, prescribed Augmentin  and for treatment for cough and shortness of breath prescribed prednisone , Tessalon  and Promethazine DM.May use additional over-the-counter medications as needed for supportive care.  May follow-up with urgent care as needed if symptoms persist or worsen.   Final Clinical Impressions(s) / UC Diagnoses   Final diagnoses:  Acute URI     Discharge Instructions      Given Augmentin  every morning and every evening for 7 days to clear any bacteria that may be contributing to symptoms  Begin prednisone  every morning with food to open and relax the airway, should settle shortness of breath  May use Tessalon  pill every 8 hours as needed for cough and may use cough syrup at bedtime to help you sleep  You can take Tylenol   as needed for fever reduction and pain relief.   For cough: honey 1/2 to 1 teaspoon (you can dilute the honey in water or another fluid).  You can also use  guaifenesin and dextromethorphan for cough. You can use a humidifier for chest congestion and cough.  If you don't have a humidifier, you can sit in the bathroom with the hot shower running.      For sore throat: try warm salt water gargles, cepacol lozenges, throat spray, warm tea or water with lemon/honey, popsicles or ice, or OTC cold relief medicine for throat discomfort.   For congestion: take a daily anti-histamine like Zyrtec, Claritin, and a oral decongestant, such  as pseudoephedrine.  You can also use Flonase 1-2 sprays in each nostril daily.   It is important to stay hydrated: drink plenty of fluids (water, gatorade/powerade/pedialyte, juices, or teas) to keep your throat moisturized and help further relieve irritation/discomfort.    ED Prescriptions     Medication Sig Dispense Auth. Provider   predniSONE  (STERAPRED UNI-PAK 21 TAB) 10 MG (21) TBPK tablet Take by mouth daily. Take 6 tabs by mouth daily  for 1 days, then 5 tabs for 1 days, then 4 tabs for 1 days, then 3 tabs for 1 days, 2 tabs for 1 days, then 1 tab by mouth daily for 1 days 21 tablet Dominique Calvey R, NP   amoxicillin -clavulanate (AUGMENTIN ) 875-125 MG tablet Take 1 tablet by mouth every 12 (twelve) hours. 14 tablet Elvina Bosch R, NP   benzonatate  (TESSALON ) 100 MG capsule Take 1 capsule (100 mg total) by mouth every 8 (eight) hours. 21 capsule Welcome Fults R, NP   promethazine-dextromethorphan (PROMETHAZINE-DM) 6.25-15 MG/5ML syrup Take 5 mLs by mouth at bedtime as needed. 118 mL Courtenay Hirth, Maybelle Spatz, NP      PDMP not reviewed this encounter.   Reena Canning, NP 10/23/23 1241

## 2023-10-23 NOTE — ED Triage Notes (Signed)
 Patient presents to UC for SOB and cough x 2 weeks. Hx of asthma. Treating with her inhaler.

## 2023-10-23 NOTE — Discharge Instructions (Signed)
 Given Augmentin  every morning and every evening for 7 days to clear any bacteria that may be contributing to symptoms  Begin prednisone  every morning with food to open and relax the airway, should settle shortness of breath  May use Tessalon  pill every 8 hours as needed for cough and may use cough syrup at bedtime to help you sleep  You can take Tylenol   as needed for fever reduction and pain relief.   For cough: honey 1/2 to 1 teaspoon (you can dilute the honey in water or another fluid).  You can also use guaifenesin and dextromethorphan for cough. You can use a humidifier for chest congestion and cough.  If you don't have a humidifier, you can sit in the bathroom with the hot shower running.      For sore throat: try warm salt water gargles, cepacol lozenges, throat spray, warm tea or water with lemon/honey, popsicles or ice, or OTC cold relief medicine for throat discomfort.   For congestion: take a daily anti-histamine like Zyrtec, Claritin, and a oral decongestant, such as pseudoephedrine.  You can also use Flonase 1-2 sprays in each nostril daily.   It is important to stay hydrated: drink plenty of fluids (water, gatorade/powerade/pedialyte, juices, or teas) to keep your throat moisturized and help further relieve irritation/discomfort.

## 2023-11-06 ENCOUNTER — Telehealth: Payer: Self-pay | Admitting: Family Medicine

## 2023-11-06 ENCOUNTER — Other Ambulatory Visit: Payer: Self-pay | Admitting: Family Medicine

## 2023-11-06 DIAGNOSIS — F900 Attention-deficit hyperactivity disorder, predominantly inattentive type: Secondary | ICD-10-CM

## 2023-11-06 NOTE — Telephone Encounter (Signed)
 [facility] is requesting refill clonazePAM  (KLONOPIN )   Please advise

## 2023-11-06 NOTE — Telephone Encounter (Signed)
 Needs appt for refills per last refill request note.

## 2023-11-06 NOTE — Telephone Encounter (Signed)
 Copied from CRM [id number]. Topic: Clinical - Medication Refill >> Nov 06, 2023 11:06 AM Wendy [name] T wrote: Medication: clonazePAM  (KLONOPIN ) 0.5 MG tablet and famotidine  (PEPCID ) 20 MG tablet  Has the patient contacted their pharmacy? Yes  This is the patient's preferred pharmacy:  [facility] - [place], [place] - [name] [address] Phone: [phone] Fax: [phone]  Is this the correct pharmacy for this prescription? Yes  Has the prescription been filled recently? Yes  Is the patient out of the medication? Yes  Has the patient been seen for an appointment in the last year OR does the patient have an upcoming appointment? Yes  Can we respond through MyChart? Yes  Agent: Please be advised that Rx refills may take up to 3 business days. We ask that you follow-up with your pharmacy.

## 2023-11-10 ENCOUNTER — Encounter: Payer: Self-pay | Admitting: Family Medicine

## 2023-11-10 ENCOUNTER — Ambulatory Visit (INDEPENDENT_AMBULATORY_CARE_PROVIDER_SITE_OTHER): Admitting: Family Medicine

## 2023-11-10 VITALS — BP 101/69 | HR 91 | Ht 63.0 in | Wt 203.4 lb

## 2023-11-10 DIAGNOSIS — F332 Major depressive disorder, recurrent severe without psychotic features: Secondary | ICD-10-CM | POA: Diagnosis not present

## 2023-11-10 DIAGNOSIS — F418 Other specified anxiety disorders: Secondary | ICD-10-CM

## 2023-11-10 DIAGNOSIS — K219 Gastro-esophageal reflux disease without esophagitis: Secondary | ICD-10-CM | POA: Diagnosis not present

## 2023-11-10 DIAGNOSIS — F410 Panic disorder [episodic paroxysmal anxiety] without agoraphobia: Secondary | ICD-10-CM | POA: Diagnosis not present

## 2023-11-10 MED ORDER — CLONAZEPAM 0.5 MG PO TABS: 0.5000 mg | ORAL_TABLET | Freq: Every day | ORAL | 1 refills | Status: DC | PRN

## 2023-11-10 MED ORDER — BUPROPION HCL ER (XL) 300 MG PO TB24
300.0000 mg | ORAL_TABLET | Freq: Every day | ORAL | 5 refills | Status: DC
Start: 2023-11-10 — End: 2024-03-06

## 2023-11-10 MED ORDER — FAMOTIDINE 20 MG PO TABS: 20.0000 mg | ORAL_TABLET | Freq: Two times a day (BID) | ORAL | 5 refills | Status: DC

## 2023-11-10 NOTE — Progress Notes (Signed)
 Established patient visit   Patient: Wendy [name]   DOB: 07-21-1975   48 y.o. Female  MRN: [id number] Visit Date: 11/10/2023  Today's healthcare provider: Jeralene Mom, MD   Chief Complaint  Patient presents with   Medication Refill    Wants to discuss dosing up on wellbutrin    Subjective    Discussed the use of AI scribe software for clinical note transcription with the patient, who gave verbal consent to proceed.  History of Present Illness   Wendy [name] is a 48 year old female who presents for medication management and follow-up for anxiety, depression and ADD.   She has been experiencing increased stress due to her daughter's accident, which left her unable to walk for four to five months. The stress is more pronounced now than it was during the time of the accident, which she feels is exacerbating depression. she would like to go back up to 300mg  bupropion  which she has done well with in the past.   She is currently taking Adderall for ADHD, adjusting the dosage between once or twice daily based on her needs. She tries to limit its use and feels she has an adequate supply for now.  No panic attacks.      Medications: Outpatient Medications Prior to Visit  Medication Sig   albuterol (VENTOLIN HFA) 108 (90 Base) MCG/ACT inhaler INHALE 2 PUFFS INTO THE LUNGS EVERY 4-6 HOURS AS NEEDED   amphetamine -dextroamphetamine  (ADDERALL) 7.5 MG tablet Take 1 tablet by mouth 2 (two) times daily.   Cholecalciferol (VITAMIN D PO) Take 1 tablet by mouth daily.   COLLAGEN PO Take 2 Scoops by mouth daily.   MAGNESIUM CARBONATE PO Take 1 tablet by mouth daily.   azelastine  (ASTELIN ) 0.1 % nasal spray Place 2 sprays into both nostrils 2 (two) times daily. Use in each nostril as directed   buPROPion  (WELLBUTRIN  XL) 150 MG 24 hr tablet Take 1 tablet (150 mg total) by mouth daily.   clonazePAM  (KLONOPIN ) 0.5 MG tablet Take 1 tablet (0.5 mg total) by mouth daily as needed for anxiety.    famotidine  (PEPCID ) 20 MG tablet Take 1 tablet (20 mg total) by mouth 2 (two) times daily.   No facility-administered medications prior to visit.   Review of Systems  Constitutional:  Negative for appetite change, chills, fatigue and fever.  Respiratory:  Negative for chest tightness and shortness of breath.   Cardiovascular:  Negative for chest pain and palpitations.  Gastrointestinal:  Negative for abdominal pain, nausea and vomiting.  Neurological:  Negative for dizziness and weakness.       Objective    BP 101/69 (BP Location: Left Arm, Patient Position: Sitting, Cuff Size: Large)   Pulse 91   Ht 5\' 3"  (1.6 m)   Wt 203 lb 6.4 oz (92.3 kg)   LMP 10/30/2023   SpO2 100%   BMI 36.03 kg/m   Physical Exam   General appearance: Mildly obese female, cooperative and in no acute distress Head: Normocephalic, without obvious abnormality, atraumatic Respiratory: Respirations even and unlabored, normal respiratory rate Extremities: All extremities are intact.  Skin: Skin color, texture, turgor normal. No rashes seen  Psych: Appropriate mood and affect. Neurologic: Mental status: Alert, oriented to person, place, and time, thought content appropriate.     Assessment & Plan     1. Severe episode of recurrent major depressive disorder, without psychotic features (HCC) (Primary) Exacerbated by daughter's illness the last several months, but daughter is improving.  Patient has taken 300mg  wellbutrin  in the past and would like to go back up dose. Advised that higher doses have potential of exacerbating anxiety and panic attacks which are otherwise under control.  Increase from to 150 buPROPion  (WELLBUTRIN  XL) 300 MG 24 hr tablet; Take 1 tablet (300 mg total) by mouth daily.  Dispense: 30 tablet; Refill: 5 She states she will call back if an problems with higher dose.   2. Situational anxiety Stable.   3. Panic attacks Well controlled. refill clonazePAM  (KLONOPIN ) 0.5 MG tablet; Take  1 tablet (0.5 mg total) by mouth daily as needed for anxiety.  Dispense: 30 tablet; Refill: 1  4. Gastroesophageal reflux disease without esophagitis refill famotidine  (PEPCID ) 20 MG tablet; Take 1 tablet (20 mg total) by mouth 2 (two) times daily.  Dispense: 60 tablet; Refill: 5   Jeralene Mom, MD  [facility] [phone] (phone) [phone] (fax)  [facility]

## 2023-12-14 ENCOUNTER — Encounter: Payer: Self-pay | Admitting: Physician Assistant

## 2023-12-14 ENCOUNTER — Telehealth: Payer: Self-pay

## 2023-12-14 DIAGNOSIS — Z1231 Encounter for screening mammogram for malignant neoplasm of breast: Secondary | ICD-10-CM

## 2023-12-14 NOTE — Telephone Encounter (Signed)
 Order placed

## 2023-12-14 NOTE — Telephone Encounter (Signed)
 Copied from CRM [id number]. Topic: General - Other >> Dec 14, 2023  8:35 AM Edsel HERO wrote: Patient mother called to see if provider can place an order for a screening mammogram. Please advise.

## 2023-12-19 ENCOUNTER — Other Ambulatory Visit: Payer: Self-pay | Admitting: Family Medicine

## 2023-12-19 DIAGNOSIS — F900 Attention-deficit hyperactivity disorder, predominantly inattentive type: Secondary | ICD-10-CM

## 2024-01-30 ENCOUNTER — Other Ambulatory Visit: Payer: Self-pay | Admitting: Family Medicine

## 2024-01-30 DIAGNOSIS — F410 Panic disorder [episodic paroxysmal anxiety] without agoraphobia: Secondary | ICD-10-CM

## 2024-02-01 ENCOUNTER — Encounter

## 2024-02-22 ENCOUNTER — Ambulatory Visit
Admission: RE | Admit: 2024-02-22 | Discharge: 2024-02-22 | Disposition: A | Discharge status: Home / Self Care | Source: Ambulatory Visit | Attending: Physician Assistant | Admitting: Physician Assistant

## 2024-02-22 DIAGNOSIS — Z1231 Encounter for screening mammogram for malignant neoplasm of breast: Secondary | ICD-10-CM | POA: Insufficient documentation

## 2024-02-27 ENCOUNTER — Ambulatory Visit: Payer: Self-pay | Admitting: Physician Assistant

## 2024-03-06 ENCOUNTER — Ambulatory Visit (INDEPENDENT_AMBULATORY_CARE_PROVIDER_SITE_OTHER): Admitting: Family Medicine

## 2024-03-06 ENCOUNTER — Encounter: Payer: Self-pay | Admitting: Family Medicine

## 2024-03-06 VITALS — BP 104/73 | HR 82 | Temp 98.0°F | Ht 63.0 in | Wt 205.6 lb

## 2024-03-06 DIAGNOSIS — F332 Major depressive disorder, recurrent severe without psychotic features: Secondary | ICD-10-CM

## 2024-03-06 DIAGNOSIS — N951 Menopausal and female climacteric states: Secondary | ICD-10-CM | POA: Diagnosis not present

## 2024-03-06 DIAGNOSIS — Z91010 Allergy to peanuts: Secondary | ICD-10-CM | POA: Diagnosis not present

## 2024-03-06 MED ORDER — VORTIOXETINE HBR 5 MG PO TABS
ORAL_TABLET | ORAL | 2 refills | Status: DC
  Filled 2024-03-11: qty 30, 30d supply, fill #0

## 2024-03-06 MED ORDER — EPINEPHRINE 0.3 MG/0.3ML IJ SOAJ: 0.3000 mg | INTRAMUSCULAR | 12 refills | Status: AC | PRN

## 2024-03-06 MED ORDER — BUPROPION HCL ER (XL) 150 MG PO TB24: 150.0000 mg | ORAL_TABLET | Freq: Every day | ORAL | 2 refills | Status: DC

## 2024-03-06 NOTE — Progress Notes (Unsigned)
 BP 104/73 (BP Location: Left Arm, Patient Position: Sitting, Cuff Size: Large)   Pulse 82   Temp 98 F (36.7 C)   Ht 5' 3 (1.6 m)   Wt 205 lb 9.6 oz (93.3 kg)   LMP 01/29/2024   SpO2 98%   BMI 36.42 kg/m    Subjective:    Patient ID: Wendy [name], female    DOB: 05-01-1976, 48 y.o.   MRN: [id number]  HPI: Wendy [name] is a 48 y.o. female  Chief Complaint  Patient presents with  . Establish Care    Pt presents today to establish care. Pt states her mom had severe osteoporosis. She wants to do preventive care to make sure she doesn't have the same thing, also would like to do heart preventative care, says she usually goes once a year. Also like to discuss her antidepressant, feels the higher dose is giving her anxiety. Would also like to discuss perimenopause.    She is concerned about perimenopause. She has been having some brain fog. Has irritability. Has not had any vaginal dryness or hot flashes and her period has been regular.   ANXIETY/DEPRESSION- feels like her wellbutrin  has been making her anxiety worse. Had a pretty intense family issues last year with her daughter having an accident and being in the hospital for several months. Has been on prozac  (blunted), effexor (hated it), trintellix  Duration: chronic Status:{Blank single:19197::controlled,uncontrolled,better,worse,exacerbated,stable} Anxious mood: {Blank single:19197::yes,no}  Excessive worrying: {Blank single:19197::yes,no} Irritability: {Blank single:19197::yes,no}  Sweating: {Blank single:19197::yes,no} Nausea: {Blank single:19197::yes,no} Palpitations:{Blank single:19197::yes,no} Hyperventilation: {Blank single:19197::yes,no} Panic attacks: {Blank single:19197::yes,no} Agoraphobia: {Blank single:19197::yes,no}  Obscessions/compulsions: {Blank single:19197::yes,no} Depressed mood: {Blank single:19197::yes,no}    03/06/2024    9:58 AM 11/10/2023    3:39  PM 05/30/2023    9:56 AM 10/12/2022    8:26 AM 08/24/2022    9:36 AM  Depression screen PHQ 2/9  Decreased Interest 2 1 1 1 1   Down, Depressed, Hopeless 2 1 1 1 3   PHQ - 2 Score 4 2 2 2 4   Altered sleeping 1 0 0 1 1  Tired, decreased energy 1 1 1 1 3   Change in appetite 2 1 1 1 2   Feeling bad or failure about yourself  2 2 2 3 1   Trouble concentrating 3 2 2 3 1   Moving slowly or fidgety/restless 0 0 1 0 0  Suicidal thoughts 0 0 0 0 0  PHQ-9 Score 13 8 9 11 12   Difficult doing work/chores Not difficult at all Somewhat difficult  Not difficult at all Not difficult at all   Anhedonia: {Blank single:19197::yes,no} Weight changes: {Blank single:19197::yes,no} Insomnia: {Blank single:19197::yes,no} {Blank single:19197::hard to fall asleep,hard to stay asleep}  Hypersomnia: {Blank single:19197::yes,no} Fatigue/loss of energy: {Blank single:19197::yes,no} Feelings of worthlessness: {Blank single:19197::yes,no} Feelings of guilt: {Blank single:19197::yes,no} Impaired concentration/indecisiveness: {Blank single:19197::yes,no} Suicidal ideations: {Blank single:19197::yes,no}  Crying spells: {Blank single:19197::yes,no} Recent Stressors/Life Changes: {Blank single:19197::yes,no}   Relationship problems: {Blank single:19197::yes,no}   Family stress: {Blank single:19197::yes,no}     Financial stress: {Blank single:19197::yes,no}    Job stress: {Blank single:19197::yes,no}    Recent death/loss: {Blank single:19197::yes,no}  OBESITY Duration:  Previous attempts at weight loss: {Blank single:19197::yes,no} Complications of obesity:  Peak weight:  Weight loss goal:  Weight loss to date:  Requesting obesity pharmacotherapy: {Blank single:19197::yes,no} Current weight loss supplements/medications: {Blank single:19197::yes,no} Previous weight loss supplements/meds: {Blank single:19197::yes,no}   Active  Ambulatory Problems    Diagnosis Date Noted  . Menstrual migraine 11/05/2015  . Attention deficit hyperactivity disorder (ADHD), predominantly inattentive  type 05/18/2021  . Obesity (BMI 35.0-39.9 without comorbidity) 11/29/2021  . Mild persistent asthma with exacerbation 08/24/2022  . Panic attacks 08/24/2022  . Screening for colon cancer 08/24/2022  . Palpitations 08/24/2022  . Screening for thyroid  disorder 08/24/2022  . Screening for diabetes mellitus 08/24/2022  . Family history of early CAD 10/12/2022  . Severe episode of recurrent major depressive disorder, without psychotic features (HCC) 10/12/2022  . Situational anxiety 05/30/2023  . Grief reaction 05/30/2023  . Gastroesophageal reflux disease without esophagitis 11/10/2023   Resolved Ambulatory Problems    Diagnosis Date Noted  . Depression 02/13/2012  . Contraception management 05/02/2012  . GAD (generalized anxiety disorder) 11/05/2015  . ADHD (attention deficit hyperactivity disorder) 11/05/2015  . Obese 11/05/2015  . Urinary incontinence in female 11/05/2015  . Constipation 11/05/2015  . Abnormal Pap smear of cervix 10/19/2011  . Recurrent major depressive disorder, in partial remission (HCC) 06/03/2020  . Chlamydia 08/18/2020  . Exposure to STD 08/18/2020  . Bacterial vaginosis 08/24/2020  . Weight loss advised 05/18/2021  . BMI 38.0-38.9,adult 05/18/2021  . Class 2 severe obesity due to excess calories with serious comorbidity and body mass index (BMI) of 37.0 to 37.9 in adult (HCC) 06/01/2021  . Menorrhagia with irregular cycle 06/01/2021  . Dizziness 06/01/2021  . Shortness of breath 06/01/2021  . Long COVID 08/24/2022  . Morbid obesity (HCC) 08/24/2022   Past Medical History:  Diagnosis Date  . Allergy   . Anxiety   . Asthma   . GERD (gastroesophageal reflux disease)   . Headache   . Heart murmur    Past Surgical History:  Procedure Laterality Date  . CESAREAN SECTION  2004   x1  . DILITATION &  CURRETTAGE/HYSTROSCOPY WITH NOVASURE ABLATION N/A 07/29/2021   Procedure: FRACTIONAL DILATATION & CURETTAGE/HYSTEROSCOPY WITH NOVASURE ABLATION/MYOSURE RESECTION OF POLYP;  Surgeon: Schermerhorn, Debby PARAS, MD;  Location: [facility] ORS;  Service: Gynecology;  Laterality: N/A;  . LAPAROSCOPIC TUBAL LIGATION  05/02/2012   Procedure: LAPAROSCOPIC TUBAL LIGATION;  Surgeon: Harland JAYSON Birkenhead, MD;  Location: [facility];  Service: Gynecology;  Laterality: Bilateral;  with filshie clips  . TUBAL LIGATION  04/2012  . WISDOM TOOTH EXTRACTION     x4  . WRIST SURGERY     rigth cyst removal.   Outpatient Encounter Medications as of 03/06/2024  Medication Sig  . albuterol (VENTOLIN HFA) 108 (90 Base) MCG/ACT inhaler INHALE 2 PUFFS INTO THE LUNGS EVERY 4-6 HOURS AS NEEDED  . amphetamine -dextroamphetamine  (ADDERALL) 7.5 MG tablet TAKE 1 TABLET BY MOUTH TWICE DAILY  . buPROPion  (WELLBUTRIN  XL) 300 MG 24 hr tablet Take 1 tablet (300 mg total) by mouth daily.  . Cholecalciferol (VITAMIN D PO) Take 1 tablet by mouth daily.  . clonazePAM  (KLONOPIN ) 0.5 MG tablet TAKE ONE TABLET BY MOUTH ONCE DAILY FOR ANXIETY  . COLLAGEN PO Take 2 Scoops by mouth daily.  . famotidine  (PEPCID ) 20 MG tablet Take 1 tablet (20 mg total) by mouth 2 (two) times daily.  SABRA MAGNESIUM CARBONATE PO Take 1 tablet by mouth daily.   No facility-administered encounter medications on file as of 03/06/2024.   Allergies  Allergen Reactions  . Peanut (Diagnostic) Shortness Of Breath  . Peanuts [Peanut Oil] Shortness Of Breath  . Banana Itching  . Other Other (See Comments)    Walnuts- makes tongue blister  . Codeine Nausea And Vomiting  . Egg-Derived Products Nausea And Vomiting  . Latex Itching  . Vicodin [Hydrocodone-Acetaminophen ] Nausea And Vomiting  . Walnut  Social History   Socioeconomic History  . Marital status: Single    Spouse name: Not on file  . Number of children: 2  . Years of education: Not on file  . Highest education level:  Bachelor's degree (e.g., BA, AB, BS)  Occupational History  . Not on file  Tobacco Use  . Smoking status: Never  . Smokeless tobacco: Never  Vaping Use  . Vaping status: Never Used  Substance and Sexual Activity  . Alcohol use: Yes    Comment: occasional/rare  . Drug use: No  . Sexual activity: Yes    Partners: Male    Birth control/protection: Surgical    Comment: Tubaligation  Other Topics Concern  . Not on file  Social History Narrative  . Not on file   Social Drivers of Health   Financial Resource Strain: Low Risk  (03/02/2024)   Overall Financial Resource Strain (CARDIA)   . Difficulty of Paying Living Expenses: Not hard at all  Food Insecurity: No Food Insecurity (03/02/2024)   Hunger Vital Sign   . Worried About Programme researcher, broadcasting/film/video in the Last Year: Never true   . Ran Out of Food in the Last Year: Never true  Transportation Needs: No Transportation Needs (03/02/2024)   PRAPARE - Transportation   . Lack of Transportation (Medical): No   . Lack of Transportation (Non-Medical): No  Physical Activity: Unknown (03/02/2024)   Exercise Vital Sign   . Days of Exercise per Week: 2 days   . Minutes of Exercise per Session: Patient declined  Stress: Stress Concern Present (03/02/2024)   [employer] of Occupational Health - Occupational Stress Questionnaire   . Feeling of Stress: To some extent  Social Connections: Socially Isolated (03/02/2024)   Social Connection and Isolation Panel   . Frequency of Communication with Friends and Family: More than three times a week   . Frequency of Social Gatherings with Friends and Family: Twice a week   . Attends Religious Services: Never   . Active Member of Clubs or Organizations: No   . Attends Banker Meetings: Not on file   . Marital Status: Divorced   Family History  Problem Relation Age of Onset  . Arthritis Mother   . Hypothyroidism Mother   . Heart attack Mother   . Heart attack Father   . Heart disease  Father        heart attack  . Alcohol abuse Father   . Heart disease Maternal Grandmother   . Emphysema Maternal Grandfather   . Heart disease Maternal Grandfather   . Asthma Paternal Grandmother   . Heart disease Paternal Grandfather   . Breast cancer Neg Hx      Review of Systems  Constitutional: Negative.   Respiratory: Negative.    Cardiovascular: Negative.   Musculoskeletal: Negative.   Skin: Negative.   Psychiatric/Behavioral: Negative.      Per HPI unless specifically indicated above     Objective:    BP 104/73 (BP Location: Left Arm, Patient Position: Sitting, Cuff Size: Large)   Pulse 82   Temp 98 F (36.7 C)   Ht 5' 3 (1.6 m)   Wt 205 lb 9.6 oz (93.3 kg)   LMP 01/29/2024   SpO2 98%   BMI 36.42 kg/m   Wt Readings from Last 3 Encounters:  03/06/24 205 lb 9.6 oz (93.3 kg)  11/10/23 203 lb 6.4 oz (92.3 kg)  05/30/23 202 lb (91.6 kg)    Physical Exam  Results for orders placed or performed in visit on 08/24/22  CBC with Differential/Platelet   Collection Time: 08/24/22 10:15 AM  Result Value Ref Range   WBC 7.0 3.4 - 10.8 x10E3/uL   RBC 4.42 3.77 - 5.28 x10E6/uL   Hemoglobin 13.7 11.1 - 15.9 g/dL   Hematocrit 59.3 65.9 - 46.6 %   MCV 92 79 - 97 fL   MCH 31.0 26.6 - 33.0 pg   MCHC 33.7 31.5 - 35.7 g/dL   RDW 87.0 88.2 - 84.5 %   Platelets 241 150 - 450 x10E3/uL   Neutrophils 65 Not Estab. %   Lymphs 27 Not Estab. %   Monocytes 7 Not Estab. %   Eos 1 Not Estab. %   Basos 0 Not Estab. %   Neutrophils Absolute 4.5 1.4 - 7.0 x10E3/uL   Lymphocytes Absolute 1.9 0.7 - 3.1 x10E3/uL   Monocytes Absolute 0.5 0.1 - 0.9 x10E3/uL   EOS (ABSOLUTE) 0.1 0.0 - 0.4 x10E3/uL   Basophils Absolute 0.0 0.0 - 0.2 x10E3/uL   Immature Granulocytes 0 Not Estab. %   Immature Grans (Abs) 0.0 0.0 - 0.1 x10E3/uL  Comprehensive Metabolic Panel (CMET)   Collection Time: 08/24/22 10:15 AM  Result Value Ref Range   Glucose 88 70 - 99 mg/dL   BUN 13 6 - 24 mg/dL    Creatinine, Ser 9.37 0.57 - 1.00 mg/dL   eGFR 888 >40 fO/fpw/8.26   BUN/Creatinine Ratio 21 9 - 23   Sodium 135 134 - 144 mmol/L   Potassium 4.4 3.5 - 5.2 mmol/L   Chloride 104 96 - 106 mmol/L   CO2 19 (L) 20 - 29 mmol/L   Calcium 8.9 8.7 - 10.2 mg/dL   Total Protein 6.3 6.0 - 8.5 g/dL   Albumin 4.3 3.9 - 4.9 g/dL   Globulin, Total 2.0 1.5 - 4.5 g/dL   Albumin/Globulin Ratio 2.2 1.2 - 2.2   Bilirubin Total 0.6 0.0 - 1.2 mg/dL   Alkaline Phosphatase 67 44 - 121 IU/L   AST 12 0 - 40 IU/L   ALT 13 0 - 32 IU/L  TSH + free T4   Collection Time: 08/24/22 10:15 AM  Result Value Ref Range   TSH 1.280 0.450 - 4.500 uIU/mL   Free T4 1.28 0.82 - 1.77 ng/dL  Lipid panel   Collection Time: 08/24/22 10:15 AM  Result Value Ref Range   Cholesterol, Total 204 (H) 100 - 199 mg/dL   Triglycerides 48 0 - 149 mg/dL   HDL 72 >60 mg/dL   VLDL Cholesterol Cal 9 5 - 40 mg/dL   LDL Chol Calc (NIH) 876 (H) 0 - 99 mg/dL   Chol/HDL Ratio 2.8 0.0 - 4.4 ratio  Hemoglobin A1c   Collection Time: 08/24/22 10:15 AM  Result Value Ref Range   Hgb A1c MFr Bld 5.1 4.8 - 5.6 %   Est. average glucose Bld gHb Est-mCnc 100 mg/dL  Cologuard   Collection Time: 09/04/22 11:30 AM  Result Value Ref Range   COLOGUARD Negative Negative      Assessment & Plan:   Problem List Items Addressed This Visit   None    Follow up plan: No follow-ups on file.

## 2024-03-07 DIAGNOSIS — Z91010 Allergy to peanuts: Secondary | ICD-10-CM | POA: Insufficient documentation

## 2024-03-07 NOTE — Assessment & Plan Note (Signed)
Epipen sent to her pharmacy.

## 2024-03-07 NOTE — Assessment & Plan Note (Signed)
 Will consider starting GLP-1 following checking labs. Await results. Treat as needed.

## 2024-03-07 NOTE — Assessment & Plan Note (Signed)
 Not doing well. Will cut her wellbutrin  down to 150mg  and start trintiellix. Recheck 1 month at physical. Call with any concerns.

## 2024-03-11 ENCOUNTER — Other Ambulatory Visit (HOSPITAL_COMMUNITY): Payer: Self-pay

## 2024-03-11 ENCOUNTER — Telehealth: Payer: Self-pay

## 2024-03-11 ENCOUNTER — Encounter (HOSPITAL_COMMUNITY): Payer: Self-pay

## 2024-03-11 NOTE — Telephone Encounter (Signed)
 Pharmacy Patient Advocate Encounter  Received notification from [facility] that Prior Authorization for Trintellix  5 mg tablets has been APPROVED from 03/11/24 to 03/11/25. Ran test claim, Copay is $10 (with manufacturer copay card). This test claim was processed through [facility]- copay amounts may vary at other pharmacies due to pharmacy/plan contracts, or as the patient moves through the different stages of their insurance plan.   PA #/Case ID/Reference #: [id number]

## 2024-03-16 ENCOUNTER — Other Ambulatory Visit: Payer: Self-pay

## 2024-03-16 ENCOUNTER — Other Ambulatory Visit (HOSPITAL_COMMUNITY): Payer: Self-pay

## 2024-03-18 ENCOUNTER — Other Ambulatory Visit (HOSPITAL_COMMUNITY): Payer: Self-pay

## 2024-04-01 ENCOUNTER — Ambulatory Visit

## 2024-04-01 DIAGNOSIS — D1801 Hemangioma of skin and subcutaneous tissue: Secondary | ICD-10-CM

## 2024-04-01 DIAGNOSIS — L811 Chloasma: Secondary | ICD-10-CM

## 2024-04-01 DIAGNOSIS — D229 Melanocytic nevi, unspecified: Secondary | ICD-10-CM

## 2024-04-01 DIAGNOSIS — Z1283 Encounter for screening for malignant neoplasm of skin: Secondary | ICD-10-CM

## 2024-04-01 DIAGNOSIS — W908XXA Exposure to other nonionizing radiation, initial encounter: Secondary | ICD-10-CM

## 2024-04-01 DIAGNOSIS — L814 Other melanin hyperpigmentation: Secondary | ICD-10-CM

## 2024-04-01 DIAGNOSIS — L821 Other seborrheic keratosis: Secondary | ICD-10-CM | POA: Diagnosis not present

## 2024-04-01 DIAGNOSIS — L578 Other skin changes due to chronic exposure to nonionizing radiation: Secondary | ICD-10-CM

## 2024-04-01 MED ORDER — HYDROQUINONE 4 % EX CREA: TOPICAL_CREAM | Freq: Two times a day (BID) | CUTANEOUS | 0 refills | Status: AC

## 2024-04-01 NOTE — Progress Notes (Signed)
    Subjective   Wendy [name] is a 48 y.o. female who presents for the following: Total body skin exam for skin cancer screening and mole check. The patient has spots, moles and lesions to be evaluated, some may be new or changing and the patient may have concern these could be cancer.. Patient is new patient  Today patient reports: Melasma on bilateral forearms   Review of Systems:    No other skin or systemic complaints except as noted in HPI or Assessment and Plan.  The following portions of the chart were reviewed this encounter and updated as appropriate: medications, allergies, medical history  Relevant Medical History:  n/a   Objective  Well appearing patient in no apparent distress; mood and affect are within normal limits. Examination was performed of the: Full Skin Examination: scalp, head, eyes, ears, nose, lips, neck, chest, axillae, abdomen, back, buttocks, bilateral upper extremities, bilateral lower extremities, hands, feet, fingers, toes, fingernails, and toenails.   Examination notable for: SKIN EXAM, Angioma(s): Scattered red vascular papule(s)  , Lentigo/lentigines: Scattered pigmented macules that are tan to brown in color and are somewhat non-uniform in shape and concentrated in the sun-exposed areas, Nevus/nevi: Scattered well-demarcated, regular, pigmented macule(s) and/or papule(s)  , Seborrheic Keratosis(es): Stuck-on appearing keratotic papule(s) on the trunk, none  irritated with redness, crusting, edema, and/or partial avulsion, Actinic Damage/Elastosis: chronic sun damage: dyspigmentation, telangiectasia, and wrinkling  - R forearm with several tan macules coalescing into larger patch  Examination limited by: Undergarments and Patient deferred removal            Assessment & Plan   SKIN CANCER SCREENING PERFORMED TODAY.  BENIGN SKIN FINDINGS  - Lentigines  - Seborrheic keratoses  - Hemangiomas   - Nevus/Multiple Benign Nevi - Reassurance provided  regarding the benign appearance of lesions noted on exam today; no treatment is indicated in the absence of symptoms/changes. - Reinforced importance of photoprotective strategies including liberal and frequent sunscreen use of a broad-spectrum SPF 30 or greater, use of protective clothing, and sun avoidance for prevention of cutaneous malignancy and photoaging.  Counseled patient on the importance of regular self-skin monitoring as well as routine clinical skin examinations as scheduled.   ACTINIC DAMAGE - Chronic condition, secondary to cumulative UV/sun exposure - Recommend daily broad spectrum sunscreen SPF 30+ to sun-exposed areas, reapply every 2 hours as needed.  - Staying in the shade or wearing long sleeves, sun glasses (UVA+UVB protection) and wide brim hats (4-inch brim around the entire circumference of the hat) are also recommended for sun protection.  - Call for new or changing lesions.  Melasma  Bilateral forearms R>L - vs Lentigines vs Cafe au lait macule  - Discussed benign appearing; call for changes  - Strict sun protection with sun avoidance and tinted broad-spectrum sunscreen (with visible light protection), ideally SPF 50+, but at least 30+ (provided handout with recommendations). Warned that even 10 minutes of unprotected sun exposure while clear can flare melasma - Start Skin Medicinals cream SM52 Hydroquinone 4.5% / Tretinoin  0.025% / Fluocinolone 0.01% / Niacinamide 4% Cream 1   Level of service outlined above   Procedures, orders, diagnosis for this visit:    There are no diagnoses linked to this encounter.  Return to clinic: Return in about 6 months (around 09/30/2024) for Recheck arms .  Documentation: I have reviewed the above documentation for accuracy and completeness, and I agree with the above.  Lauraine JAYSON Kanaris, MD

## 2024-04-01 NOTE — Patient Instructions (Signed)

## 2024-04-03 ENCOUNTER — Other Ambulatory Visit (HOSPITAL_COMMUNITY)
Admission: RE | Admit: 2024-04-03 | Discharge: 2024-04-03 | Disposition: A | Discharge status: Home / Self Care | Source: Ambulatory Visit | Attending: Family Medicine | Admitting: Family Medicine

## 2024-04-03 ENCOUNTER — Ambulatory Visit: Admitting: Family Medicine

## 2024-04-03 ENCOUNTER — Encounter: Payer: Self-pay | Admitting: Family Medicine

## 2024-04-03 VITALS — BP 129/88 | HR 81 | Temp 98.0°F | Ht 63.0 in

## 2024-04-03 DIAGNOSIS — Z0000 Encounter for general adult medical examination without abnormal findings: Secondary | ICD-10-CM

## 2024-04-03 DIAGNOSIS — N3945 Continuous leakage: Secondary | ICD-10-CM | POA: Diagnosis not present

## 2024-04-03 DIAGNOSIS — Z23 Encounter for immunization: Secondary | ICD-10-CM

## 2024-04-03 DIAGNOSIS — F332 Major depressive disorder, recurrent severe without psychotic features: Secondary | ICD-10-CM | POA: Diagnosis not present

## 2024-04-03 DIAGNOSIS — N939 Abnormal uterine and vaginal bleeding, unspecified: Secondary | ICD-10-CM | POA: Insufficient documentation

## 2024-04-03 MED ORDER — OMEPRAZOLE 20 MG PO CPDR
20.0000 mg | DELAYED_RELEASE_CAPSULE | Freq: Every day | ORAL | 3 refills | Status: AC
Start: 2024-04-03 — End: ?

## 2024-04-03 MED ORDER — TIRZEPATIDE-WEIGHT MANAGEMENT 2.5 MG/0.5ML ~~LOC~~ SOLN: 2.5000 mg | SUBCUTANEOUS | 3 refills | Status: DC

## 2024-04-03 NOTE — Progress Notes (Signed)
 BP 129/88   Pulse 81   Temp 98 F (36.7 C) (Oral)   Ht 5' 3 (1.6 m)   LMP 03/18/2024 (Exact Date)   SpO2 99%   BMI 36.42 kg/m    Subjective:    Patient ID: Wendy [name], female    DOB: 1975/07/26, 48 y.o.   MRN: [id number]  HPI: Wendy [name] is a 48 y.o. female presenting on 04/03/2024 for comprehensive medical examination. Current medical complaints include:  ANXIETY/DEPRESSION- did not start the trintillex, less anxious on the wellbutrin  Duration: chronic Status:uncontrolled Anxious mood: yes  Excessive worrying: no Irritability: no  Sweating: no Nausea: no Palpitations:no Hyperventilation: no Panic attacks: no Agoraphobia: no  Obscessions/compulsions: no Depressed mood: yes    03/06/2024    9:58 AM 11/10/2023    3:39 PM 05/30/2023    9:56 AM 10/12/2022    8:26 AM 08/24/2022    9:36 AM  Depression screen PHQ 2/9  Decreased Interest 2 1 1 1 1   Down, Depressed, Hopeless 2 1 1 1 3   PHQ - 2 Score 4 2 2 2 4   Altered sleeping 1 0 0 1 1  Tired, decreased energy 1 1 1 1 3   Change in appetite 2 1 1 1 2   Feeling bad or failure about yourself  2 2 2 3 1   Trouble concentrating 3 2 2 3 1   Moving slowly or fidgety/restless 0 0 1 0 0  Suicidal thoughts 0 0 0 0 0  PHQ-9 Score 13 8 9 11 12   Difficult doing work/chores Not difficult at all Somewhat difficult  Not difficult at all Not difficult at all      03/06/2024    9:58 AM 11/10/2023    3:40 PM 05/30/2023    9:56 AM 08/24/2022    9:43 AM  GAD 7 : Generalized Anxiety Score  Nervous, Anxious, on Edge 2 1 3 3   Control/stop worrying 3 2 3 2   Worry too much - different things 3 2 1 2   Trouble relaxing 2 2 2  0  Restless 2 1 1 3   Easily annoyed or irritable 2 1 1 3   Afraid - awful might happen 3 2 1 1   Total GAD 7 Score 17 11 12 14   Anxiety Difficulty Not difficult at all   Not difficult at all   Anhedonia: no Weight changes: no Insomnia: yes   Hypersomnia: no Fatigue/loss of energy: yes Feelings of worthlessness:  yes Feelings of guilt: no Impaired concentration/indecisiveness: yes Suicidal ideations: no  Crying spells: yes Recent Stressors/Life Changes: yes   Relationship problems: yes   Family stress: yes     Financial stress: yes   OBESITY Duration: chronic Previous attempts at weight loss: diet, exercise, portion control Complications of obesity: depression, GERD Peak weight: 205 (current) Weight loss goal: to be healthy Weight loss to date: none Requesting obesity pharmacotherapy: yes Current weight loss supplements/medications: no Previous weight loss supplements/meds: no Calories:    Menopausal Symptoms: yes  Depression Screen done today and results listed below:     03/06/2024    9:58 AM 11/10/2023    3:39 PM 05/30/2023    9:56 AM 10/12/2022    8:26 AM 08/24/2022    9:36 AM  Depression screen PHQ 2/9  Decreased Interest 2 1 1 1 1   Down, Depressed, Hopeless 2 1 1 1 3   PHQ - 2 Score 4 2 2 2 4   Altered sleeping 1 0 0 1 1  Tired, decreased energy 1 1 1  1  3  Change in appetite 2 1 1 1 2   Feeling bad or failure about yourself  2 2 2 3 1   Trouble concentrating 3 2 2 3 1   Moving slowly or fidgety/restless 0 0 1 0 0  Suicidal thoughts 0 0 0 0 0  PHQ-9 Score 13 8 9 11 12   Difficult doing work/chores Not difficult at all Somewhat difficult  Not difficult at all Not difficult at all     Past Medical History:  Past Medical History:  Diagnosis Date   Abnormal Pap smear of cervix 10/19/2011   ADHD (attention deficit hyperactivity disorder)    Allergy    Anxiety    Asthma    Chlamydia    Depression    GERD (gastroesophageal reflux disease)    occ no meds   Headache    migraines   Heart murmur    slight    Surgical History:  Past Surgical History:  Procedure Laterality Date   CESAREAN SECTION  2004   x1   DILITATION & CURRETTAGE/HYSTROSCOPY WITH NOVASURE ABLATION N/A 07/29/2021   Procedure: FRACTIONAL DILATATION & CURETTAGE/HYSTEROSCOPY WITH NOVASURE ABLATION/MYOSURE  RESECTION OF POLYP;  Surgeon: Lovetta Debby PARAS, MD;  Location: [facility];  Service: Gynecology;  Laterality: N/A;   LAPAROSCOPIC TUBAL LIGATION  05/02/2012   Procedure: LAPAROSCOPIC TUBAL LIGATION;  Surgeon: Harland JAYSON Birkenhead, MD;  Location: [facility];  Service: Gynecology;  Laterality: Bilateral;  with filshie clips   TUBAL LIGATION  04/2012   WISDOM TOOTH EXTRACTION     x4   WRIST SURGERY     rigth cyst removal.    Medications:  Current Outpatient Medications on File Prior to Visit  Medication Sig   albuterol (VENTOLIN HFA) 108 (90 Base) MCG/ACT inhaler INHALE 2 PUFFS INTO THE LUNGS EVERY 4-6 HOURS AS NEEDED   amphetamine -dextroamphetamine  (ADDERALL) 7.5 MG tablet TAKE 1 TABLET BY MOUTH TWICE DAILY   buPROPion  (WELLBUTRIN  XL) 150 MG 24 hr tablet Take 1 tablet (150 mg total) by mouth daily.   Cholecalciferol (VITAMIN D PO) Take 1 tablet by mouth daily.   clonazePAM  (KLONOPIN ) 0.5 MG tablet TAKE ONE TABLET BY MOUTH ONCE DAILY FOR ANXIETY   COLLAGEN PO Take 2 Scoops by mouth daily.   EPINEPHrine  (EPIPEN  2-PAK) 0.3 mg/0.3 mL IJ SOAJ injection Inject 0.3 mg into the muscle as needed for anaphylaxis.   hydroquinone 4 % cream Apply topically 2 (two) times daily.   MAGNESIUM CARBONATE PO Take 1 tablet by mouth daily.   vortioxetine  HBr (TRINTELLIX ) 5 MG TABS tablet Take 1/2 daily for 1 week, then increase 1 tab daily   No current facility-administered medications on file prior to visit.    Allergies:  Allergies  Allergen Reactions   Peanut (Diagnostic) Shortness Of Breath   Peanuts [Peanut Oil] Shortness Of Breath   Banana Itching   Other Other (See Comments)    Walnuts- makes tongue blister   Codeine Nausea And Vomiting   Egg Protein-Containing Drug Products Nausea And Vomiting   Latex Itching   Vicodin [Hydrocodone-Acetaminophen ] Nausea And Vomiting   Walnut     Social History:  Social History   Socioeconomic History   Marital status: Single    Spouse name: Not on file    Number of children: 2   Years of education: Not on file   Highest education level: Bachelor's degree (e.g., BA, AB, BS)  Occupational History   Not on file  Tobacco Use   Smoking status: Never   Smokeless tobacco:  Never  Vaping Use   Vaping status: Never Used  Substance and Sexual Activity   Alcohol use: Yes    Alcohol/week: 2.0 standard drinks of alcohol    Types: 2 Glasses of wine per week    Comment: occasional/rare   Drug use: No   Sexual activity: Not Currently    Partners: Male    Birth control/protection: Surgical    Comment: Tubaligation  Other Topics Concern   Not on file  Social History Narrative   Not on file   Social Drivers of Health   Financial Resource Strain: Low Risk  (03/30/2024)   Overall Financial Resource Strain (CARDIA)    Difficulty of Paying Living Expenses: Not very hard  Food Insecurity: No Food Insecurity (03/30/2024)   Hunger Vital Sign    Worried About Running Out of Food in the Last Year: Never true    Ran Out of Food in the Last Year: Never true  Transportation Needs: No Transportation Needs (03/30/2024)   PRAPARE - Administrator, Civil Service (Medical): No    Lack of Transportation (Non-Medical): No  Physical Activity: Insufficiently Active (03/30/2024)   Exercise Vital Sign    Days of Exercise per Week: 2 days    Minutes of Exercise per Session: 20 min  Stress: Stress Concern Present (03/30/2024)   [employer] of Occupational Health - Occupational Stress Questionnaire    Feeling of Stress: Very much  Social Connections: Socially Isolated (03/30/2024)   Social Connection and Isolation Panel    Frequency of Communication with Friends and Family: More than three times a week    Frequency of Social Gatherings with Friends and Family: Three times a week    Attends Religious Services: Never    Active Member of Clubs or Organizations: No    Attends Banker Meetings: Not on file    Marital Status: Divorced   Intimate Partner Violence: Not At Risk (04/03/2024)   Humiliation, Afraid, Rape, and Kick questionnaire    Fear of Current or Ex-Partner: No    Emotionally Abused: No    Physically Abused: No    Sexually Abused: No   Social History   Tobacco Use  Smoking Status Never  Smokeless Tobacco Never   Social History   Substance and Sexual Activity  Alcohol Use Yes   Alcohol/week: 2.0 standard drinks of alcohol   Types: 2 Glasses of wine per week   Comment: occasional/rare    Family History:  Family History  Problem Relation Age of Onset   Arthritis Mother    Hypothyroidism Mother    Heart attack Mother    Osteoporosis Mother    Heart attack Father    Heart disease Father        heart attack   Alcohol abuse Father    Heart disease Maternal Grandmother    Emphysema Maternal Grandfather    Heart disease Maternal Grandfather    Asthma Paternal Grandmother    Heart disease Paternal Grandfather    Breast cancer Neg Hx     Past medical history, surgical history, medications, allergies, family history and social history reviewed with patient today and changes made to appropriate areas of the chart.   Review of Systems  Constitutional: Negative.   HENT: Negative.    Eyes:  Positive for blurred vision. Negative for double vision, photophobia, pain, discharge and redness.  Respiratory: Negative.    Cardiovascular:  Positive for palpitations. Negative for chest pain, orthopnea, claudication, leg swelling and PND.  Gastrointestinal:  Positive for heartburn. Negative for abdominal pain, blood in stool, constipation, diarrhea, melena, nausea and vomiting.  Genitourinary:  Positive for dysuria and frequency. Negative for flank pain, hematuria and urgency.       + leaking  Musculoskeletal: Negative.   Skin:  Positive for rash.  Neurological: Negative.   Endo/Heme/Allergies:  Positive for environmental allergies. Negative for polydipsia. Bruises/bleeds easily.  Psychiatric/Behavioral:   Positive for depression. Negative for hallucinations, memory loss, substance abuse and suicidal ideas. The patient is nervous/anxious. The patient does not have insomnia.    All other ROS negative except what is listed above and in the HPI.      Objective:    BP 129/88   Pulse 81   Temp 98 F (36.7 C) (Oral)   Ht 5' 3 (1.6 m)   LMP 03/18/2024 (Exact Date)   SpO2 99%   BMI 36.42 kg/m   Wt Readings from Last 3 Encounters:  03/06/24 205 lb 9.6 oz (93.3 kg)  11/10/23 203 lb 6.4 oz (92.3 kg)  05/30/23 202 lb (91.6 kg)    Physical Exam Vitals and nursing note reviewed.  Constitutional:      General: She is not in acute distress.    Appearance: Normal appearance. She is not ill-appearing, toxic-appearing or diaphoretic.  HENT:     Head: Normocephalic and atraumatic.     Right Ear: Tympanic membrane, ear canal and external ear normal. There is no impacted cerumen.     Left Ear: Tympanic membrane, ear canal and external ear normal. There is no impacted cerumen.     Nose: Nose normal. No congestion or rhinorrhea.     Mouth/Throat:     Mouth: Mucous membranes are moist.     Pharynx: Oropharynx is clear. No oropharyngeal exudate or posterior oropharyngeal erythema.  Eyes:     General: No scleral icterus.       Right eye: No discharge.        Left eye: No discharge.     Extraocular Movements: Extraocular movements intact.     Conjunctiva/sclera: Conjunctivae normal.     Pupils: Pupils are equal, round, and reactive to light.  Neck:     Vascular: No carotid bruit.  Cardiovascular:     Rate and Rhythm: Normal rate and regular rhythm.     Pulses: Normal pulses.     Heart sounds: No murmur heard.    No friction rub. No gallop.  Pulmonary:     Effort: Pulmonary effort is normal. No respiratory distress.     Breath sounds: Normal breath sounds. No stridor. No wheezing, rhonchi or rales.  Chest:     Chest wall: No tenderness.  Abdominal:     General: Abdomen is flat. Bowel sounds  are normal. There is no distension.     Palpations: Abdomen is soft. There is no mass.     Tenderness: There is no abdominal tenderness. There is no right CVA tenderness, left CVA tenderness, guarding or rebound.     Hernia: No hernia is present.  Genitourinary:    Comments: Breast and pelvic exams deferred with shared decision making Musculoskeletal:        General: No swelling, tenderness, deformity or signs of injury.     Cervical back: Normal range of motion and neck supple. No rigidity. No muscular tenderness.     Right lower leg: No edema.     Left lower leg: No edema.  Lymphadenopathy:     Cervical: No cervical adenopathy.  Skin:  General: Skin is warm and dry.     Capillary Refill: Capillary refill takes less than 2 seconds.     Coloration: Skin is not jaundiced or pale.     Findings: No bruising, erythema, lesion or rash.  Neurological:     General: No focal deficit present.     Mental Status: She is alert and oriented to person, place, and time. Mental status is at baseline.     Cranial Nerves: No cranial nerve deficit.     Sensory: No sensory deficit.     Motor: No weakness.     Coordination: Coordination normal.     Gait: Gait normal.     Deep Tendon Reflexes: Reflexes normal.  Psychiatric:        Mood and Affect: Mood normal.        Behavior: Behavior normal.        Thought Content: Thought content normal.        Judgment: Judgment normal.     Results for orders placed or performed in visit on 04/03/24  Cytology - PAP   Collection Time: 04/03/24 10:06 AM  Result Value Ref Range   High risk HPV Negative    Adequacy      Satisfactory for evaluation; transformation zone component ABSENT.   Diagnosis      - Negative for intraepithelial lesion or malignancy (NILM)   Comment Normal Reference Range HPV - Negative   CBC with Differential/Platelet   Collection Time: 04/03/24 10:17 AM  Result Value Ref Range   WBC 7.6 3.4 - 10.8 x10E3/uL   RBC 4.22 3.77 - 5.28  x10E6/uL   Hemoglobin 13.3 11.1 - 15.9 g/dL   Hematocrit 59.3 65.9 - 46.6 %   MCV 96 79 - 97 fL   MCH 31.5 26.6 - 33.0 pg   MCHC 32.8 31.5 - 35.7 g/dL   RDW 87.5 88.2 - 84.5 %   Platelets 197 150 - 450 x10E3/uL   Neutrophils 65 Not Estab. %   Lymphs 26 Not Estab. %   Monocytes 6 Not Estab. %   Eos 3 Not Estab. %   Basos 0 Not Estab. %   Neutrophils Absolute 4.9 1.4 - 7.0 x10E3/uL   Lymphocytes Absolute 2.0 0.7 - 3.1 x10E3/uL   Monocytes Absolute 0.5 0.1 - 0.9 x10E3/uL   EOS (ABSOLUTE) 0.2 0.0 - 0.4 x10E3/uL   Basophils Absolute 0.0 0.0 - 0.2 x10E3/uL   Immature Granulocytes 0 Not Estab. %   Immature Grans (Abs) 0.0 0.0 - 0.1 x10E3/uL  Comprehensive metabolic panel with GFR   Collection Time: 04/03/24 10:17 AM  Result Value Ref Range   Glucose 71 70 - 99 mg/dL   BUN 9 6 - 24 mg/dL   Creatinine, Ser 9.32 0.57 - 1.00 mg/dL   eGFR 891 >40 fO/fpw/8.26   BUN/Creatinine Ratio 13 9 - 23   Sodium 138 134 - 144 mmol/L   Potassium 3.9 3.5 - 5.2 mmol/L   Chloride 102 96 - 106 mmol/L   CO2 23 20 - 29 mmol/L   Calcium 8.8 8.7 - 10.2 mg/dL   Total Protein 6.1 6.0 - 8.5 g/dL   Albumin 4.2 3.9 - 4.9 g/dL   Globulin, Total 1.9 1.5 - 4.5 g/dL   Bilirubin Total 0.4 0.0 - 1.2 mg/dL   Alkaline Phosphatase 80 41 - 116 IU/L   AST 17 0 - 40 IU/L   ALT 13 0 - 32 IU/L  Lipid Panel w/o Chol/HDL Ratio   Collection Time: 04/03/24  10:17 AM  Result Value Ref Range   Cholesterol, Total 181 100 - 199 mg/dL   Triglycerides 78 0 - 149 mg/dL   HDL 67 >60 mg/dL   VLDL Cholesterol Cal 14 5 - 40 mg/dL   LDL Chol Calc (NIH) 899 (H) 0 - 99 mg/dL  TSH   Collection Time: 04/03/24 10:17 AM  Result Value Ref Range   TSH 1.280 0.450 - 4.500 uIU/mL  Hepatitis B surface antibody,quantitative   Collection Time: 04/03/24 10:17 AM  Result Value Ref Range   Hepatitis B Surf Ab Quant <3.5 (L) Immunity>10 mIU/mL      Assessment & Plan:   Problem List Items Addressed This Visit       Other   Morbid obesity  (HCC)   Due to GERD and depression. Would like to start zepbound. [facility]. Follow up 6 weeks. Call with any concerns.       Relevant Medications   tirzepatide (ZEPBOUND) 2.5 MG/0.5ML injection vial   Severe episode of recurrent major depressive disorder, without psychotic features (HCC)   Encouraged patient to start trintellix . Call with any concerns. Continue to monitor.       Other Visit Diagnoses       Routine general medical examination at a health care facility    -  Primary   Vaccines up to date. Screening labs checked today. Pap done. Mammogram up to date. Continue diet and exercise. Call with any concerns.   Relevant Orders   CBC with Differential/Platelet (Completed)   Comprehensive metabolic panel with GFR (Completed)   Lipid Panel w/o Chol/HDL Ratio (Completed)   Cytology - PAP (Completed)   TSH (Completed)   Hepatitis B surface antibody,quantitative (Completed)     Continuous leakage of urine       Referral for pelvic floor PT placed today.   Relevant Orders   Ambulatory referral to Physical Therapy     Need for pneumococcal 20-valent conjugate vaccination       Pneumonia vaccine given today.   Relevant Orders   Pneumococcal conjugate vaccine 20-valent (Prevnar 20) (Completed)        Follow up plan: Return in about 4 weeks (around 05/01/2024).   LABORATORY TESTING:  - Pap smear: pap done  IMMUNIZATIONS:   - Tdap: Tetanus vaccination status reviewed: last tetanus booster within 10 years. - Influenza: Refused - Prevnar: Administered today - COVID: Refused - HPV: Not applicable - Shingrix vaccine: Not applicable  SCREENING: -Mammogram: Up to date  - Colonoscopy: Up to date   PATIENT COUNSELING:   Advised to take 1 mg of folate supplement per day if capable of pregnancy.   Sexuality: Discussed sexually transmitted diseases, partner selection, use of condoms, avoidance of unintended pregnancy  and contraceptive alternatives.   Advised  to avoid cigarette smoking.  I discussed with the patient that most people either abstain from alcohol or drink within safe limits (<=14/week and <=4 drinks/occasion for males, <=7/weeks and <= 3 drinks/occasion for females) and that the risk for alcohol disorders and other health effects rises proportionally with the number of drinks per week and how often a drinker exceeds daily limits.  Discussed cessation/primary prevention of drug use and availability of treatment for abuse.   Diet: Encouraged to adjust caloric intake to maintain  or achieve ideal body weight, to reduce intake of dietary saturated fat and total fat, to limit sodium intake by avoiding high sodium foods and not adding table salt, and to maintain adequate dietary  potassium and calcium preferably from fresh fruits, vegetables, and low-fat dairy products.    stressed the importance of regular exercise  Injury prevention: Discussed safety belts, safety helmets, smoke detector, smoking near bedding or upholstery.   Dental health: Discussed importance of regular tooth brushing, flossing, and dental visits.    NEXT PREVENTATIVE PHYSICAL DUE IN 1 YEAR. Return in about 4 weeks (around 05/01/2024).

## 2024-04-04 ENCOUNTER — Encounter: Admitting: Family Medicine

## 2024-04-04 LAB — CBC WITH DIFFERENTIAL/PLATELET
Basophils Absolute: 0 x10E3/uL (ref 0.0–0.2)
Basos: 0 %
EOS (ABSOLUTE): 0.2 x10E3/uL (ref 0.0–0.4)
Eos: 3 %
Hematocrit: 40.6 % (ref 34.0–46.6)
Hemoglobin: 13.3 g/dL (ref 11.1–15.9)
Immature Grans (Abs): 0 x10E3/uL (ref 0.0–0.1)
Immature Granulocytes: 0 %
Lymphocytes Absolute: 2 x10E3/uL (ref 0.7–3.1)
Lymphs: 26 %
MCH: 31.5 pg (ref 26.6–33.0)
MCHC: 32.8 g/dL (ref 31.5–35.7)
MCV: 96 fL (ref 79–97)
Monocytes Absolute: 0.5 x10E3/uL (ref 0.1–0.9)
Monocytes: 6 %
Neutrophils Absolute: 4.9 x10E3/uL (ref 1.4–7.0)
Neutrophils: 65 %
Platelets: 197 x10E3/uL (ref 150–450)
RBC: 4.22 x10E6/uL (ref 3.77–5.28)
RDW: 12.4 % (ref 11.7–15.4)
WBC: 7.6 x10E3/uL (ref 3.4–10.8)

## 2024-04-04 LAB — COMPREHENSIVE METABOLIC PANEL WITH GFR
ALT: 13 IU/L (ref 0–32)
AST: 17 IU/L (ref 0–40)
Albumin: 4.2 g/dL (ref 3.9–4.9)
Alkaline Phosphatase: 80 IU/L (ref 41–116)
BUN/Creatinine Ratio: 13 (ref 9–23)
BUN: 9 mg/dL (ref 6–24)
Bilirubin Total: 0.4 mg/dL (ref 0.0–1.2)
CO2: 23 mmol/L (ref 20–29)
Calcium: 8.8 mg/dL (ref 8.7–10.2)
Chloride: 102 mmol/L (ref 96–106)
Creatinine, Ser: 0.67 mg/dL (ref 0.57–1.00)
Globulin, Total: 1.9 g/dL (ref 1.5–4.5)
Glucose: 71 mg/dL (ref 70–99)
Potassium: 3.9 mmol/L (ref 3.5–5.2)
Sodium: 138 mmol/L (ref 134–144)
Total Protein: 6.1 g/dL (ref 6.0–8.5)
eGFR: 108 mL/min/1.73 (ref >59)

## 2024-04-04 LAB — LIPID PANEL W/O CHOL/HDL RATIO
Cholesterol, Total: 181 mg/dL (ref 100–199)
HDL: 67 mg/dL (ref >39)
LDL Chol Calc (NIH): 100 mg/dL — ABNORMAL HIGH (ref 0–99)
Triglycerides: 78 mg/dL (ref 0–149)
VLDL Cholesterol Cal: 14 mg/dL (ref 5–40)

## 2024-04-04 LAB — TSH: TSH: 1.28 u[IU]/mL (ref 0.450–4.500)

## 2024-04-04 LAB — HEPATITIS B SURFACE ANTIBODY, QUANTITATIVE: Hepatitis B Surf Ab Quant: <3.5 m[IU]/mL — ABNORMAL LOW

## 2024-04-05 ENCOUNTER — Other Ambulatory Visit: Payer: Self-pay | Admitting: Family Medicine

## 2024-04-05 ENCOUNTER — Ambulatory Visit: Payer: Self-pay | Admitting: Family Medicine

## 2024-04-05 DIAGNOSIS — F410 Panic disorder [episodic paroxysmal anxiety] without agoraphobia: Secondary | ICD-10-CM

## 2024-04-05 DIAGNOSIS — F900 Attention-deficit hyperactivity disorder, predominantly inattentive type: Secondary | ICD-10-CM

## 2024-04-05 LAB — CYTOLOGY - PAP
Adequacy: ABSENT
Comment: NEGATIVE
Diagnosis: NEGATIVE
High risk HPV: NEGATIVE

## 2024-04-07 NOTE — Assessment & Plan Note (Signed)
 Encouraged patient to start trintellix . Call with any concerns. Continue to monitor.

## 2024-04-07 NOTE — Assessment & Plan Note (Signed)
 Due to GERD and depression. Would like to start zepbound. Rx sent [facility]. Follow up 6 weeks. Call with any concerns.

## 2024-04-08 NOTE — Telephone Encounter (Signed)
 Requested medications are due for refill today.  yes  Requested medications are on the active medications list.  yes  Last refill. Clonazepam  01/31/2024 #30 0 rf, Adderall 12/19/2023 #60 0 rf   Future visit scheduled.   yes  Notes to clinic.  Refills not delegated. Rx signed by other providers.    Requested Prescriptions  Pending Prescriptions Disp Refills   clonazePAM  (KLONOPIN ) 0.5 MG tablet [Pharmacy Med Name: CLONAZEPAM  0.5 MG TAB] 30 tablet     Sig: TAKE ONE TABLET BY MOUTH ONCE DAILY FOR ANXIETY     Not Delegated - Psychiatry: Anxiolytics/Hypnotics 2 Failed - 04/08/2024  3:04 PM      Failed - This refill cannot be delegated      Failed - Urine Drug Screen completed in last 360 days      Passed - Patient is not pregnant      Passed - Valid encounter within last 6 months    Recent Outpatient Visits           5 days ago Routine general medical examination at a health care facility   [facility], Megan P, DO   1 month ago Severe episode of recurrent major depressive disorder, without psychotic features (HCC)   [facility] [place], Megan P, DO   5 months ago Severe episode of recurrent major depressive disorder, without psychotic features (HCC)   [facility] [place] Nancyann BRAVO, MD       Future Appointments             In 5 months Kitts, Lauraine BROCKS, MD  [facility]             amphetamine -dextroamphetamine  (ADDERALL) 7.5 MG tablet [Pharmacy Med Name: AMPHETAMINE -DEXTROAMPHETAMINE  7.5 M] 60 tablet     Sig: TAKE 1 TABLET BY MOUTH TWICE DAILY     Not Delegated - Psychiatry:  Stimulants/ADHD Failed - 04/08/2024  3:04 PM      Failed - This refill cannot be delegated      Failed - Urine Drug Screen completed in last 360 days      Passed - Last BP in normal range    BP Readings from Last 1 Encounters:  04/03/24 129/88         Passed - Last Heart Rate in normal range     Pulse Readings from Last 1 Encounters:  04/03/24 81         Passed - Valid encounter within last 6 months    Recent Outpatient Visits           5 days ago Routine general medical examination at a health care facility   [facility], Megan P, DO   1 month ago Severe episode of recurrent major depressive disorder, without psychotic features (HCC)   [facility] [place], Megan P, DO   5 months ago Severe episode of recurrent major depressive disorder, without psychotic features (HCC)   [facility] [place], Nancyann BRAVO, MD       Future Appointments             In 5 months Raymund, Lauraine BROCKS, MD [facility]

## 2024-04-09 NOTE — Telephone Encounter (Addendum)
 Caller checking on the status of why medication was refused and was informed by the pharmacy PCP is no longer at the practice. Patient established care with PCP and was last seen 04/03/2024.  Caller states PCP prescribed new medications and unsure if PCP wants patient to continue to taking amphetamine -dextroamphetamine  (ADDERALL) 7.5 MG tablet clonazePAM  (KLONOPIN ) 0.5 MG tablet, caller is seeking clarity, Patient is at work and patient mother is calling on behalf.  Please follow up with caller today.   [facility] - [place], [place] - [address] Phone: [phone]  Fax: [phone]

## 2024-04-29 ENCOUNTER — Other Ambulatory Visit: Payer: Self-pay | Admitting: Family Medicine

## 2024-04-29 DIAGNOSIS — F410 Panic disorder [episodic paroxysmal anxiety] without agoraphobia: Secondary | ICD-10-CM

## 2024-04-29 DIAGNOSIS — F900 Attention-deficit hyperactivity disorder, predominantly inattentive type: Secondary | ICD-10-CM

## 2024-04-30 NOTE — Telephone Encounter (Signed)
 Requested medications are due for refill today.  yes  Requested medications are on the active medications list.  yes  Cherre refill. Klonopin  01/31/2024 #30 0 rf. Adderall 12/19/2023 #60 0 rf/  Future visit scheduled.   Yes  Notes to clinic.  Refill not delegated    Requested Prescriptions  Pending Prescriptions Disp Refills   clonazePAM  (KLONOPIN ) 0.5 MG tablet [Pharmacy Med Name: CLONAZEPAM  0.5 MG TAB] 30 tablet     Sig: TAKE ONE TABLET BY MOUTH ONCE DAILY FOR ANXIETY     Not Delegated - Psychiatry: Anxiolytics/Hypnotics 2 Failed - 04/30/2024  5:01 PM      Failed - This refill cannot be delegated      Failed - Urine Drug Screen completed in last 360 days      Passed - Patient is not pregnant      Passed - Valid encounter within last 6 months    Recent Outpatient Visits           3 weeks ago Routine general medical examination at a health care facility   [facility], Megan P, DO   1 month ago Severe episode of recurrent major depressive disorder, without psychotic features (HCC)   [place] [facility] [place], Megan P, DO   5 months ago Severe episode of recurrent major depressive disorder, without psychotic features (HCC)   [place] [facility] [place] Nancyann BRAVO, MD       Future Appointments             In 5 months Raymund, Lauraine BROCKS, MD [place] [facility]             amphetamine -dextroamphetamine  (ADDERALL) 7.5 MG tablet [Pharmacy Med Name: AMPHETAMINE -DEXTROAMPHETAMINE  7.5 M] 60 tablet     Sig: TAKE 1 TABLET BY MOUTH TWICE DAILY     Not Delegated - Psychiatry:  Stimulants/ADHD Failed - 04/30/2024  5:01 PM      Failed - This refill cannot be delegated      Failed - Urine Drug Screen completed in last 360 days      Passed - Last BP in normal range    BP Readings from Last 1 Encounters:  04/03/24 129/88         Passed - Last Heart Rate in normal range    Pulse Readings from Last 1  Encounters:  04/03/24 81         Passed - Valid encounter within last 6 months    Recent Outpatient Visits           3 weeks ago Routine general medical examination at a health care facility   [facility], Megan P, DO   1 month ago Severe episode of recurrent major depressive disorder, without psychotic features (HCC)   [place] [facility] [place], Megan P, DO   5 months ago Severe episode of recurrent major depressive disorder, without psychotic features (HCC)   [place] [facility], Nancyann BRAVO, MD       Future Appointments             In 5 months Raymund, Lauraine BROCKS, MD [facility]

## 2024-05-07 ENCOUNTER — Ambulatory Visit: Admitting: Family Medicine

## 2024-05-23 ENCOUNTER — Encounter: Payer: Self-pay | Admitting: Family Medicine

## 2024-05-23 ENCOUNTER — Ambulatory Visit: Admitting: Family Medicine

## 2024-05-23 VITALS — BP 109/77 | HR 73 | Temp 98.0°F | Ht 63.0 in | Wt 195.8 lb

## 2024-05-23 DIAGNOSIS — E66811 Obesity, class 1: Secondary | ICD-10-CM

## 2024-05-23 DIAGNOSIS — Z789 Other specified health status: Secondary | ICD-10-CM

## 2024-05-23 DIAGNOSIS — F332 Major depressive disorder, recurrent severe without psychotic features: Secondary | ICD-10-CM

## 2024-05-23 MED ORDER — BUPROPION HCL ER (XL) 150 MG PO TB24: 150.0000 mg | ORAL_TABLET | Freq: Every day | ORAL | 1 refills | Status: AC

## 2024-05-23 MED ORDER — VORTIOXETINE HBR 5 MG PO TABS: ORAL_TABLET | ORAL | 1 refills | Status: AC

## 2024-05-23 MED ORDER — TIRZEPATIDE-WEIGHT MANAGEMENT 5 MG/0.5ML ~~LOC~~ SOLN: 5.0000 mg | SUBCUTANEOUS | 3 refills | Status: AC

## 2024-05-23 NOTE — Assessment & Plan Note (Signed)
 Doing much better on her trintellix . No side effects. Will continue current regimen. Call with any concerns.

## 2024-05-23 NOTE — Progress Notes (Signed)
 BP 109/77   Pulse 73   Temp 98 F (36.7 C)   Ht 5' 3 (1.6 m)   Wt 195 lb 12.8 oz (88.8 kg)   BMI 34.68 kg/m    Subjective:    Patient ID: Wendy [name], female    DOB: 1975/09/22, 48 y.o.   MRN: [id number]  HPI: Wendy [name] is a 48 y.o. female  Chief Complaint  Patient presents with   Obesity   Depression   OBESITY Duration: chronic Previous attempts at weight loss: diet, exercise, portion control Complications of obesity: depression, GERD Peak weight: 205 (current) Weight loss goal: to be healthy Weight loss to date: 10 lbs Requesting obesity pharmacotherapy: yes Current weight loss supplements/medications: no Previous weight loss supplements/meds: no  DEPRESSION Mood status: better Satisfied with current treatment?: yes Symptom severity: mild  Duration of current treatment : months Side effects: no Medication compliance: excellent compliance Psychotherapy/counseling: no  Depressed mood: yes Anxious mood: yes Anhedonia: no Significant weight loss or gain: yes Insomnia: no  Fatigue: yes Feelings of worthlessness or guilt: no Impaired concentration/indecisiveness: no Suicidal ideations: no Hopelessness: no Crying spells: no    05/23/2024    9:05 AM 03/06/2024    9:58 AM 11/10/2023    3:39 PM 05/30/2023    9:56 AM 10/12/2022    8:26 AM  Depression screen PHQ 2/9  Decreased Interest 0 2 1 1 1   Down, Depressed, Hopeless 0 2 1 1 1   PHQ - 2 Score 0 4 2 2 2   Altered sleeping 2 1 0 0 1  Tired, decreased energy 0 1 1 1 1   Change in appetite 1 2 1 1 1   Feeling bad or failure about yourself  1 2 2 2 3   Trouble concentrating 1 3 2 2 3   Moving slowly or fidgety/restless 0 0 0 1 0  Suicidal thoughts 0 0 0 0 0  PHQ-9 Score 5 13  8  9  11    Difficult doing work/chores Not difficult at all Not difficult at all Somewhat difficult  Not difficult at all     Data saved with a previous flowsheet row definition      05/23/2024    9:06 AM 03/06/2024    9:58 AM  11/10/2023    3:40 PM 05/30/2023    9:56 AM  GAD 7 : Generalized Anxiety Score  Nervous, Anxious, on Edge 1 2 1 3   Control/stop worrying 1 3 2 3   Worry too much - different things 1 3 2 1   Trouble relaxing 1 2 2 2   Restless 1 2 1 1   Easily annoyed or irritable 0 2 1 1   Afraid - awful might happen 1 3 2 1   Total GAD 7 Score 6 17 11 12   Anxiety Difficulty Not difficult at all Not difficult at all        Relevant past medical, surgical, family and social history reviewed and updated as indicated. Interim medical history since our last visit reviewed. Allergies and medications reviewed and updated.  Review of Systems  Constitutional: Negative.   Respiratory: Negative.    Cardiovascular: Negative.   Musculoskeletal: Negative.   Neurological: Negative.   Psychiatric/Behavioral: Negative.      Per HPI unless specifically indicated above     Objective:    BP 109/77   Pulse 73   Temp 98 F (36.7 C)   Ht 5' 3 (1.6 m)   Wt 195 lb 12.8 oz (88.8 kg)   BMI 34.68 kg/m  Wt Readings from Last 3 Encounters:  05/23/24 195 lb 12.8 oz (88.8 kg)  03/06/24 205 lb 9.6 oz (93.3 kg)  11/10/23 203 lb 6.4 oz (92.3 kg)    Physical Exam Vitals and nursing note reviewed.  Constitutional:      General: She is not in acute distress.    Appearance: Normal appearance. She is not ill-appearing, toxic-appearing or diaphoretic.  HENT:     Head: Normocephalic and atraumatic.     Right Ear: External ear normal.     Left Ear: External ear normal.     Nose: Nose normal.     Mouth/Throat:     Mouth: Mucous membranes are moist.     Pharynx: Oropharynx is clear.  Eyes:     General: No scleral icterus.       Right eye: No discharge.        Left eye: No discharge.     Extraocular Movements: Extraocular movements intact.     Conjunctiva/sclera: Conjunctivae normal.     Pupils: Pupils are equal, round, and reactive to light.  Cardiovascular:     Rate and Rhythm: Normal rate and regular rhythm.      Pulses: Normal pulses.     Heart sounds: Normal heart sounds. No murmur heard.    No friction rub. No gallop.  Pulmonary:     Effort: Pulmonary effort is normal. No respiratory distress.     Breath sounds: Normal breath sounds. No stridor. No wheezing, rhonchi or rales.  Chest:     Chest wall: No tenderness.  Musculoskeletal:        General: Normal range of motion.     Cervical back: Normal range of motion and neck supple.  Skin:    General: Skin is warm and dry.     Capillary Refill: Capillary refill takes less than 2 seconds.     Coloration: Skin is not jaundiced or pale.     Findings: No bruising, erythema, lesion or rash.  Neurological:     General: No focal deficit present.     Mental Status: She is alert and oriented to person, place, and time. Mental status is at baseline.  Psychiatric:        Mood and Affect: Mood normal.        Behavior: Behavior normal.        Thought Content: Thought content normal.        Judgment: Judgment normal.     Results for orders placed or performed in visit on 04/03/24  Cytology - PAP   Collection Time: 04/03/24 10:06 AM  Result Value Ref Range   High risk HPV Negative    Adequacy      Satisfactory for evaluation; transformation zone component ABSENT.   Diagnosis      - Negative for intraepithelial lesion or malignancy (NILM)   Comment Normal Reference Range HPV - Negative   CBC with Differential/Platelet   Collection Time: 04/03/24 10:17 AM  Result Value Ref Range   WBC 7.6 3.4 - 10.8 x10E3/uL   RBC 4.22 3.77 - 5.28 x10E6/uL   Hemoglobin 13.3 11.1 - 15.9 g/dL   Hematocrit 59.3 65.9 - 46.6 %   MCV 96 79 - 97 fL   MCH 31.5 26.6 - 33.0 pg   MCHC 32.8 31.5 - 35.7 g/dL   RDW 87.5 88.2 - 84.5 %   Platelets 197 150 - 450 x10E3/uL   Neutrophils 65 Not Estab. %   Lymphs 26 Not Estab. %   Monocytes  6 Not Estab. %   Eos 3 Not Estab. %   Basos 0 Not Estab. %   Neutrophils Absolute 4.9 1.4 - 7.0 x10E3/uL   Lymphocytes Absolute 2.0 0.7  - 3.1 x10E3/uL   Monocytes Absolute 0.5 0.1 - 0.9 x10E3/uL   EOS (ABSOLUTE) 0.2 0.0 - 0.4 x10E3/uL   Basophils Absolute 0.0 0.0 - 0.2 x10E3/uL   Immature Granulocytes 0 Not Estab. %   Immature Grans (Abs) 0.0 0.0 - 0.1 x10E3/uL  Comprehensive metabolic panel with GFR   Collection Time: 04/03/24 10:17 AM  Result Value Ref Range   Glucose 71 70 - 99 mg/dL   BUN 9 6 - 24 mg/dL   Creatinine, Ser 9.32 0.57 - 1.00 mg/dL   eGFR 891 >40 fO/fpw/8.26   BUN/Creatinine Ratio 13 9 - 23   Sodium 138 134 - 144 mmol/L   Potassium 3.9 3.5 - 5.2 mmol/L   Chloride 102 96 - 106 mmol/L   CO2 23 20 - 29 mmol/L   Calcium 8.8 8.7 - 10.2 mg/dL   Total Protein 6.1 6.0 - 8.5 g/dL   Albumin 4.2 3.9 - 4.9 g/dL   Globulin, Total 1.9 1.5 - 4.5 g/dL   Bilirubin Total 0.4 0.0 - 1.2 mg/dL   Alkaline Phosphatase 80 41 - 116 IU/L   AST 17 0 - 40 IU/L   ALT 13 0 - 32 IU/L  Lipid Panel w/o Chol/HDL Ratio   Collection Time: 04/03/24 10:17 AM  Result Value Ref Range   Cholesterol, Total 181 100 - 199 mg/dL   Triglycerides 78 0 - 149 mg/dL   HDL 67 >60 mg/dL   VLDL Cholesterol Cal 14 5 - 40 mg/dL   LDL Chol Calc (NIH) 899 (H) 0 - 99 mg/dL  TSH   Collection Time: 04/03/24 10:17 AM  Result Value Ref Range   TSH 1.280 0.450 - 4.500 uIU/mL  Hepatitis B surface antibody,quantitative   Collection Time: 04/03/24 10:17 AM  Result Value Ref Range   Hepatitis B Surf Ab Quant <3.5 (L) Immunity>10 mIU/mL      Assessment & Plan:   Problem List Items Addressed This Visit       Other   Obesity (BMI 30.0-34.9) - Primary   Congratulated patient on 10lb weight loss! Will increase her zepbound  to 5mg . Call with any concerns. Follow up 3 months.       Severe episode of recurrent major depressive disorder, without psychotic features (HCC)   Doing much better on her trintellix . No side effects. Will continue current regimen. Call with any concerns.       Relevant Medications   vortioxetine  HBr (TRINTELLIX ) 5 MG TABS  tablet   buPROPion  (WELLBUTRIN  XL) 150 MG 24 hr tablet   Other Visit Diagnoses       Not immune to hepatitis B virus       Hep B given today   Relevant Orders   Heplisav-B (HepB-CPG) Vaccine        Follow up plan: Return in about 3 months (around 08/21/2024).

## 2024-05-23 NOTE — Assessment & Plan Note (Signed)
 Congratulated patient on 10lb weight loss! Will increase her zepbound  to 5mg . Call with any concerns. Follow up 3 months.

## 2024-06-17 ENCOUNTER — Telehealth: Payer: Self-pay | Admitting: Family Medicine

## 2024-06-17 NOTE — Telephone Encounter (Signed)
 Form has been started and filled out. Form has been placed in the providers folder for completion and signature when she returns to the office 06/21/24.

## 2024-06-17 NOTE — Telephone Encounter (Signed)
 Patients mother brought in a medication  assistance form  for  vortioxetine  HBr (TRINTELLIX ) 5 MG TABS tablet   that needs to be completed by the provider. She states that the medication listed has been working and has enough medication until 1/10.Patient's mother would like a call when it is ready and will fax it.

## 2024-06-21 NOTE — Telephone Encounter (Signed)
 Called and notified patient's mother that the form has been completed and is ready for pick up.   Copy placed in scan bin and original place in bin for pick up.

## 2024-06-24 ENCOUNTER — Telehealth: Payer: Self-pay | Admitting: Family Medicine

## 2024-06-24 NOTE — Telephone Encounter (Signed)
 Patients mother Janese Daisy) dropped off [employer] Asst. Forms to be faxed. Stated all forms need to be faxed together. Request  phone call [phone] when completed.

## 2024-06-26 NOTE — Telephone Encounter (Unsigned)
 Copied from CRM [id number]. Topic: General - Other >> Jun 25, 2024  3:48 PM Joesph NOVAK wrote: Reason for CRM: patients mother would like to receive a my chart message when the forms have been faxed over. Please advise >> Jun 26, 2024  1:24 PM Zebedee SAUNDERS wrote: Pt's mother Georgene Cones [phone], [phone] calling on behalf of pt regarding paperwork that needs sent via fax for medication. All information was on paperwork. Please call pt at [phone] or Georgene Cones [phone] for status on fax. Pt is out of medication.

## 2024-06-26 NOTE — Telephone Encounter (Unsigned)
 Copied from CRM [id number]. Topic: General - Other >> Jun 25, 2024  3:48 PM Joesph NOVAK wrote: Reason for CRM: patients mother would like to receive a my chart message when the forms have been faxed over. Please advise

## 2024-06-26 NOTE — Telephone Encounter (Signed)
 Mychart message has already been sent to patient

## 2024-07-11 ENCOUNTER — Ambulatory Visit
Admission: EM | Admit: 2024-07-11 | Discharge: 2024-07-11 | Disposition: A | Discharge status: Home / Self Care | Attending: Emergency Medicine | Admitting: Emergency Medicine

## 2024-07-11 DIAGNOSIS — J069 Acute upper respiratory infection, unspecified: Secondary | ICD-10-CM | POA: Diagnosis not present

## 2024-07-11 MED ORDER — PREDNISONE 10 MG (21) PO TBPK: ORAL_TABLET | Freq: Every day | ORAL | 0 refills | Status: AC

## 2024-07-11 MED ORDER — PROMETHAZINE-DM 6.25-15 MG/5ML PO SYRP: 5.0000 mL | ORAL_SOLUTION | Freq: Four times a day (QID) | ORAL | 0 refills | Status: AC | PRN

## 2024-07-11 MED ORDER — AMOXICILLIN-POT CLAVULANATE 875-125 MG PO TABS: 1.0000 | ORAL_TABLET | Freq: Two times a day (BID) | ORAL | 0 refills | Status: AC

## 2024-07-11 NOTE — Discharge Instructions (Signed)
 Today you are evaluated for your upper respiratory symptoms and as they have persisted for greater 10 days we will place you on an antibiotic most likely cause for symptoms to linger  Begin Augmentin  twice daily for 7 days  Begin prednisone  every morning to help in a relaxed airway should help with persistent if the cough and shortness of breath  You may use cough syrup every 6 hours but please be mindful can make you feel sleepy  You can take Tylenol  as needed for fever reduction and pain relief.   For cough: honey 1/2 to 1 teaspoon (you can dilute the honey in water or another fluid).  You can also use guaifenesin and dextromethorphan for cough. You can use a humidifier for chest congestion and cough.  If you don't have a humidifier, you can sit in the bathroom with the hot shower running.      For sore throat: try warm salt water gargles, cepacol lozenges, throat spray, warm tea or water with lemon/honey, popsicles or ice, or OTC cold relief medicine for throat discomfort.   For congestion: take a daily anti-histamine like Zyrtec, Claritin, and a oral decongestant, such as pseudoephedrine.  You can also use Flonase 1-2 sprays in each nostril daily.   It is important to stay hydrated: drink plenty of fluids (water, gatorade/powerade/pedialyte, juices, or teas) to keep your throat moisturized and help further relieve irritation/discomfort.

## 2024-07-11 NOTE — ED Triage Notes (Signed)
 Pt present with c/o a cough x 13 days. Pt states the cough will not go away and has been sneezing a lot.  Pt states she has taken Theraflu and Dayquil

## 2024-07-11 NOTE — ED Provider Notes (Signed)
 " CAY RALPH PELT    CSN: [id number] Arrival date & time: 07/11/24  1042      History   Chief Complaint Chief Complaint  Patient presents with   Cough    HPI Wendy [name] is a 49 y.o. female.   Patient presents for evaluation of nasal congestion, rhinorrhea, nonproductive cough, sinus pressure to the bilateral cheeks, mucus present within the throat, sore throat and shortness of breath with exertion present for 13 days.  Sore throat has resolved.  Tolerable to food and liquids and has attempted to increase fluids as well as use hot tea.  Additionally has taken TheraFlu DayQuil and flaxseed oil.  Known sick contact prior.  History of asthma.  Past Medical History:  Diagnosis Date   Abnormal Pap smear of cervix 10/19/2011   ADHD (attention deficit hyperactivity disorder)    Allergy    Anxiety    Asthma    Chlamydia    Depression    GERD (gastroesophageal reflux disease)    occ no meds   Headache    migraines   Heart murmur    slight    Patient Active Problem List   Diagnosis Date Noted   Abnormal uterine bleeding (AUB) 04/03/2024   Peanut allergy 03/07/2024   Gastroesophageal reflux disease without esophagitis 11/10/2023   Situational anxiety 05/30/2023   Grief reaction 05/30/2023   Family history of early CAD 10/12/2022   Severe episode of recurrent major depressive disorder, without psychotic features (HCC) 10/12/2022   Mild persistent asthma with exacerbation 08/24/2022   Panic attacks 08/24/2022   Palpitations 08/24/2022   Obesity (BMI 30.0-34.9) 11/29/2021   Attention deficit hyperactivity disorder (ADHD), predominantly inattentive type 05/18/2021   Cervical radiculopathy 04/08/2021   Pain of cervical spine 04/08/2021   Menstrual migraine 11/05/2015    Past Surgical History:  Procedure Laterality Date   CESAREAN SECTION  2004   x1   DILITATION & CURRETTAGE/HYSTROSCOPY WITH NOVASURE ABLATION N/A 07/29/2021   Procedure: FRACTIONAL DILATATION &  CURETTAGE/HYSTEROSCOPY WITH NOVASURE ABLATION/MYOSURE RESECTION OF POLYP;  Surgeon: Lovetta Debby PARAS, MD;  Location: [facility];  Service: Gynecology;  Laterality: N/A;   LAPAROSCOPIC TUBAL LIGATION  05/02/2012   Procedure: LAPAROSCOPIC TUBAL LIGATION;  Surgeon: Harland JAYSON Birkenhead, MD;  Location: [facility];  Service: Gynecology;  Laterality: Bilateral;  with filshie clips   TUBAL LIGATION  04/2012   WISDOM TOOTH EXTRACTION     x4   WRIST SURGERY     rigth cyst removal.    OB History     Gravida  2   Para  2   Term      Preterm      AB      Living  2      SAB      IAB      Ectopic      Multiple      Live Births  2            Home Medications    Prior to Admission medications  Medication Sig Start Date End Date Taking? Authorizing Provider  amoxicillin -clavulanate (AUGMENTIN ) 875-125 MG tablet Take 1 tablet by mouth every 12 (twelve) hours. 07/11/24  Yes Reyhan Moronta R, NP  predniSONE  (STERAPRED UNI-PAK 21 TAB) 10 MG (21) TBPK tablet Take by mouth daily. Take 6 tabs by mouth daily  for 1 days, then 5 tabs for 1 days, then 4 tabs for 1 days, then 3 tabs for 1 days, 2 tabs for 1 days, then  1 tab by mouth daily for 1 days 07/11/24  Yes Eain Mullendore, Shelba SAUNDERS, NP  promethazine -dextromethorphan (PROMETHAZINE -DM) 6.25-15 MG/5ML syrup Take 5 mLs by mouth 4 (four) times daily as needed. 07/11/24  Yes Jamichael Knotts R, NP  albuterol (VENTOLIN HFA) 108 (90 Base) MCG/ACT inhaler INHALE 2 PUFFS INTO THE LUNGS EVERY 4-6 HOURS AS NEEDED 07/22/21   Emilio Marseille T, FNP  amphetamine -dextroamphetamine  (ADDERALL) 7.5 MG tablet TAKE 1 TABLET BY MOUTH TWICE DAILY 12/19/23   Gasper Nancyann BRAVO, MD  buPROPion  (WELLBUTRIN  XL) 150 MG 24 hr tablet Take 1 tablet (150 mg total) by mouth daily. 05/23/24   Johnson, Megan P, DO  Cholecalciferol (VITAMIN D PO) Take 1 tablet by mouth daily.    [provider]  clonazePAM  (KLONOPIN ) 0.5 MG tablet TAKE ONE TABLET BY MOUTH ONCE DAILY FOR ANXIETY 01/31/24    Ostwalt, Janna, PA-C  COLLAGEN PO Take 2 Scoops by mouth daily.    [provider]  EPINEPHrine  (EPIPEN  2-PAK) 0.3 mg/0.3 mL IJ SOAJ injection Inject 0.3 mg into the muscle as needed for anaphylaxis. 03/06/24   Johnson, Megan P, DO  hydroquinone  4 % cream Apply topically 2 (two) times daily. 04/01/24   Raymund Lauraine BROCKS, MD  MAGNESIUM CARBONATE PO Take 1 tablet by mouth daily.    [provider]  omeprazole  (PRILOSEC) 20 MG capsule Take 1 capsule (20 mg total) by mouth daily. 04/03/24   Johnson, Megan P, DO  tirzepatide  5 MG/0.5ML injection vial Inject 5 mg into the skin once a week. 05/23/24   Vicci Bouchard P, DO  vortioxetine  HBr (TRINTELLIX ) 5 MG TABS tablet Take 1/2 daily for 1 week, then increase 1 tab daily 05/23/24   Vicci Bouchard SQUIBB, DO    Family History Family History  Problem Relation Age of Onset   Arthritis Mother    Hypothyroidism Mother    Heart attack Mother    Osteoporosis Mother    Heart attack Father    Heart disease Father        heart attack   Alcohol abuse Father    Heart disease Maternal Grandmother    Emphysema Maternal Grandfather    Heart disease Maternal Grandfather    Asthma Paternal Grandmother    Heart disease Paternal Grandfather    Breast cancer Neg Hx     Social History Social History[1]   Allergies   Peanut (diagnostic), Peanuts [peanut oil], Banana, Other, Codeine, Egg protein-containing drug products, Latex, Vicodin [hydrocodone-acetaminophen ], and Walnut   Review of Systems Review of Systems   Physical Exam Triage Vital Signs ED Triage Vitals  Encounter Vitals Group     BP 07/11/24 1110 108/71     Girls Systolic BP Percentile --      Girls Diastolic BP Percentile --      Boys Systolic BP Percentile --      Boys Diastolic BP Percentile --      Pulse Rate 07/11/24 1110 99     Resp 07/11/24 1110 17     Temp 07/11/24 1110 98.1 F (36.7 C)     Temp src --      SpO2 07/11/24 1110 98 %     Weight --      Height --       Head Circumference --      Peak Flow --      Pain Score 07/11/24 1109 5     Pain Loc --      Pain Education --  Exclude from Growth Chart --    No data found.  Updated Vital Signs BP 108/71 (BP Location: Right Arm)   Pulse 99   Temp 98.1 F (36.7 C)   Resp 17   LMP 06/20/2024 (Exact Date)   SpO2 98%   Visual Acuity Right Eye Distance:   Left Eye Distance:   Bilateral Distance:    Right Eye Near:   Left Eye Near:    Bilateral Near:     Physical Exam Constitutional:      Appearance: Normal appearance.  HENT:     Right Ear: Tympanic membrane, ear canal and external ear normal.     Left Ear: Tympanic membrane, ear canal and external ear normal.     Nose: Congestion present.     Mouth/Throat:     Pharynx: No oropharyngeal exudate or posterior oropharyngeal erythema.  Eyes:     Extraocular Movements: Extraocular movements intact.  Cardiovascular:     Rate and Rhythm: Normal rate and regular rhythm.     Pulses: Normal pulses.     Heart sounds: Normal heart sounds.  Pulmonary:     Effort: Pulmonary effort is normal.     Breath sounds: Normal breath sounds.     Comments: Persistent nonproductive cough with Musculoskeletal:     Cervical back: Normal range of motion and neck supple.  Lymphadenopathy:     Cervical: No cervical adenopathy.  Neurological:     Mental Status: She is alert and oriented to person, place, and time. Mental status is at baseline.      UC Treatments / Results  Labs (all labs ordered are listed, but only abnormal results are displayed) Labs Reviewed - No data to display  EKG   Radiology No results found.  Procedures Procedures (including critical care time)  Medications Ordered in UC Medications - No data to display  Initial Impression / Assessment and Plan / UC Course  I have reviewed the triage vital signs and the nursing notes.  Pertinent labs & imaging results that were available during my care of the patient were  reviewed by me and considered in my medical decision making (see chart for details).  Acute URI  Patient is in no signs of distress nor toxic appearing.  Vital signs are stable.  Low suspicion for pneumonia, pneumothorax or bronchitis and therefore will defer imaging.  Viral testing deferred due to timeline, symptoms persisting past 10 days without signs of resolution, prescribing Augmentin  and additionally prescribed prednisone  for management of shortness of breath.  Promethazine  DM sent to pharmacy for management of cough. May use additional over-the-counter medications as needed for supportive care.  May follow-up with urgent care as needed if symptoms persist or worsen.  Final Clinical Impressions(s) / UC Diagnoses   Final diagnoses:  Acute URI     Discharge Instructions      Today you are evaluated for your upper respiratory symptoms and as they have persisted for greater 10 days we will place you on an antibiotic most likely cause for symptoms to linger  Begin Augmentin  twice daily for 7 days  Begin prednisone  every morning to help in a relaxed airway should help with persistent if the cough and shortness of breath  You may use cough syrup every 6 hours but please be mindful can make you feel sleepy  You can take Tylenol  as needed for fever reduction and pain relief.   For cough: honey 1/2 to 1 teaspoon (you can dilute the honey in water or  another fluid).  You can also use guaifenesin and dextromethorphan for cough. You can use a humidifier for chest congestion and cough.  If you don't have a humidifier, you can sit in the bathroom with the hot shower running.      For sore throat: try warm salt water gargles, cepacol lozenges, throat spray, warm tea or water with lemon/honey, popsicles or ice, or OTC cold relief medicine for throat discomfort.   For congestion: take a daily anti-histamine like Zyrtec, Claritin, and a oral decongestant, such as pseudoephedrine.  You can also use  Flonase 1-2 sprays in each nostril daily.   It is important to stay hydrated: drink plenty of fluids (water, gatorade/powerade/pedialyte, juices, or teas) to keep your throat moisturized and help further relieve irritation/discomfort.    ED Prescriptions     Medication Sig Dispense Auth. Provider   amoxicillin -clavulanate (AUGMENTIN ) 875-125 MG tablet Take 1 tablet by mouth every 12 (twelve) hours. 14 tablet Korbyn Vanes R, NP   promethazine -dextromethorphan (PROMETHAZINE -DM) 6.25-15 MG/5ML syrup Take 5 mLs by mouth 4 (four) times daily as needed. 118 mL Caliana Spires R, NP   predniSONE  (STERAPRED UNI-PAK 21 TAB) 10 MG (21) TBPK tablet Take by mouth daily. Take 6 tabs by mouth daily  for 1 days, then 5 tabs for 1 days, then 4 tabs for 1 days, then 3 tabs for 1 days, 2 tabs for 1 days, then 1 tab by mouth daily for 1 days 21 tablet Hetty Linhart, Shelba SAUNDERS, NP      PDMP not reviewed this encounter.    [1]  Social History Tobacco Use   Smoking status: Never   Smokeless tobacco: Never  Vaping Use   Vaping status: Never Used  Substance Use Topics   Alcohol use: Yes    Alcohol/week: 2.0 standard drinks of alcohol    Types: 2 Glasses of wine per week    Comment: occasional/rare   Drug use: No     Teresa Shelba SAUNDERS, NP 07/11/24 1149  "

## 2024-08-21 ENCOUNTER — Ambulatory Visit: Admitting: Family Medicine

## 2024-09-30 ENCOUNTER — Ambulatory Visit
